# Patient Record
Sex: Male | Born: 1945
Health system: Southern US, Community
[De-identification: ages and names within clinical notes are randomized; demographics above are authoritative.]

## PROBLEM LIST (undated history)

## (undated) DIAGNOSIS — F438 Other reactions to severe stress: Secondary | ICD-10-CM

## (undated) DIAGNOSIS — I10 Essential (primary) hypertension: Secondary | ICD-10-CM

## (undated) DIAGNOSIS — M25519 Pain in unspecified shoulder: Secondary | ICD-10-CM

## (undated) DIAGNOSIS — D721 Eosinophilia, unspecified: Secondary | ICD-10-CM

## (undated) DIAGNOSIS — E785 Hyperlipidemia, unspecified: Secondary | ICD-10-CM

## (undated) DIAGNOSIS — L409 Psoriasis, unspecified: Secondary | ICD-10-CM

## (undated) DIAGNOSIS — M009 Pyogenic arthritis, unspecified: Secondary | ICD-10-CM

## (undated) DIAGNOSIS — IMO0002 Reserved for concepts with insufficient information to code with codable children: Secondary | ICD-10-CM

## (undated) DIAGNOSIS — M545 Low back pain, unspecified: Secondary | ICD-10-CM

## (undated) DIAGNOSIS — L299 Pruritus, unspecified: Secondary | ICD-10-CM

## (undated) DIAGNOSIS — F4389 Other reactions to severe stress: Secondary | ICD-10-CM

## (undated) DIAGNOSIS — L259 Unspecified contact dermatitis, unspecified cause: Secondary | ICD-10-CM

## (undated) DIAGNOSIS — E119 Type 2 diabetes mellitus without complications: Secondary | ICD-10-CM

## (undated) DIAGNOSIS — C649 Malignant neoplasm of unspecified kidney, except renal pelvis: Secondary | ICD-10-CM

## (undated) HISTORY — DX: Other reactions to severe stress: F43.89

## (undated) HISTORY — DX: Unspecified contact dermatitis, unspecified cause: L25.9

## (undated) HISTORY — DX: Low back pain, unspecified: M54.50

## (undated) HISTORY — DX: Reserved for concepts with insufficient information to code with codable children: IMO0002

## (undated) HISTORY — DX: Type 2 diabetes mellitus without complications: E11.9

## (undated) HISTORY — DX: Pruritus, unspecified: L29.9

## (undated) HISTORY — DX: Pyogenic arthritis, unspecified: M00.9

## (undated) HISTORY — DX: Hyperlipidemia, unspecified: E78.5

## (undated) HISTORY — DX: Essential (primary) hypertension: I10

## (undated) HISTORY — DX: Low back pain: M54.5

## (undated) HISTORY — DX: Eosinophilia, unspecified: D72.10

## (undated) HISTORY — DX: Pain in unspecified shoulder: M25.519

## (undated) HISTORY — DX: Other reactions to severe stress: F43.8

## (undated) HISTORY — DX: Malignant neoplasm of unspecified kidney, except renal pelvis: C64.9

## (undated) HISTORY — DX: Eosinophilia: D72.1

---

## 1999-11-25 ENCOUNTER — Encounter: Admission: RE | Admit: 1999-11-25 | Discharge: 2000-02-23 | Payer: Self-pay | Admitting: Endocrinology

## 2000-11-28 ENCOUNTER — Encounter: Payer: Self-pay | Admitting: Endocrinology

## 2000-11-28 ENCOUNTER — Ambulatory Visit (HOSPITAL_COMMUNITY): Admission: RE | Admit: 2000-11-28 | Discharge: 2000-11-28 | Payer: Self-pay | Admitting: Endocrinology

## 2002-09-05 ENCOUNTER — Encounter: Payer: Self-pay | Admitting: Endocrinology

## 2002-09-05 ENCOUNTER — Encounter: Payer: Self-pay | Admitting: Internal Medicine

## 2002-09-05 ENCOUNTER — Encounter: Admission: RE | Admit: 2002-09-05 | Discharge: 2002-09-05 | Payer: Self-pay | Admitting: Endocrinology

## 2004-09-06 ENCOUNTER — Emergency Department (HOSPITAL_COMMUNITY): Admission: EM | Admit: 2004-09-06 | Discharge: 2004-09-06 | Payer: Self-pay | Admitting: Emergency Medicine

## 2004-09-23 ENCOUNTER — Encounter: Admission: RE | Admit: 2004-09-23 | Discharge: 2004-09-23 | Payer: Self-pay | Admitting: Urology

## 2004-09-25 ENCOUNTER — Encounter (INDEPENDENT_AMBULATORY_CARE_PROVIDER_SITE_OTHER): Payer: Self-pay | Admitting: Specialist

## 2004-09-25 ENCOUNTER — Ambulatory Visit (HOSPITAL_COMMUNITY): Admission: RE | Admit: 2004-09-25 | Discharge: 2004-09-25 | Payer: Self-pay | Admitting: Urology

## 2004-09-25 ENCOUNTER — Ambulatory Visit (HOSPITAL_BASED_OUTPATIENT_CLINIC_OR_DEPARTMENT_OTHER): Admission: RE | Admit: 2004-09-25 | Discharge: 2004-09-25 | Payer: Self-pay | Admitting: Urology

## 2004-10-09 ENCOUNTER — Ambulatory Visit (HOSPITAL_BASED_OUTPATIENT_CLINIC_OR_DEPARTMENT_OTHER): Admission: RE | Admit: 2004-10-09 | Discharge: 2004-10-09 | Payer: Self-pay | Admitting: Urology

## 2004-10-09 ENCOUNTER — Ambulatory Visit (HOSPITAL_COMMUNITY): Admission: RE | Admit: 2004-10-09 | Discharge: 2004-10-09 | Payer: Self-pay | Admitting: Urology

## 2005-01-25 ENCOUNTER — Inpatient Hospital Stay (HOSPITAL_COMMUNITY): Admission: RE | Admit: 2005-01-25 | Discharge: 2005-01-28 | Payer: Self-pay | Admitting: Urology

## 2005-01-25 ENCOUNTER — Encounter (INDEPENDENT_AMBULATORY_CARE_PROVIDER_SITE_OTHER): Payer: Self-pay | Admitting: *Deleted

## 2005-01-25 DIAGNOSIS — C649 Malignant neoplasm of unspecified kidney, except renal pelvis: Secondary | ICD-10-CM

## 2005-01-25 DIAGNOSIS — Z85528 Personal history of other malignant neoplasm of kidney: Secondary | ICD-10-CM | POA: Insufficient documentation

## 2005-01-25 HISTORY — DX: Malignant neoplasm of unspecified kidney, except renal pelvis: C64.9

## 2005-01-25 HISTORY — PX: LAPAROSCOPIC PARTIAL NEPHRECTOMY: SUR782

## 2005-04-29 ENCOUNTER — Encounter: Admission: RE | Admit: 2005-04-29 | Discharge: 2005-04-29 | Payer: Self-pay | Admitting: Emergency Medicine

## 2005-04-30 ENCOUNTER — Encounter: Admission: RE | Admit: 2005-04-30 | Discharge: 2005-04-30 | Payer: Self-pay | Admitting: Emergency Medicine

## 2005-04-30 HISTORY — PX: BACK SURGERY: SHX140

## 2005-05-04 ENCOUNTER — Encounter: Admission: RE | Admit: 2005-05-04 | Discharge: 2005-05-04 | Payer: Self-pay | Admitting: Emergency Medicine

## 2005-05-14 ENCOUNTER — Encounter: Admission: RE | Admit: 2005-05-14 | Discharge: 2005-05-14 | Payer: Self-pay | Admitting: Emergency Medicine

## 2005-05-28 ENCOUNTER — Encounter: Admission: RE | Admit: 2005-05-28 | Discharge: 2005-05-28 | Payer: Self-pay | Admitting: Emergency Medicine

## 2005-06-22 ENCOUNTER — Ambulatory Visit (HOSPITAL_COMMUNITY): Admission: RE | Admit: 2005-06-22 | Discharge: 2005-06-23 | Payer: Self-pay | Admitting: Neurosurgery

## 2007-07-10 ENCOUNTER — Encounter: Payer: Self-pay | Admitting: Internal Medicine

## 2009-01-15 ENCOUNTER — Encounter: Payer: Self-pay | Admitting: Internal Medicine

## 2009-01-15 DIAGNOSIS — E1169 Type 2 diabetes mellitus with other specified complication: Secondary | ICD-10-CM | POA: Insufficient documentation

## 2009-01-15 DIAGNOSIS — E1159 Type 2 diabetes mellitus with other circulatory complications: Secondary | ICD-10-CM | POA: Insufficient documentation

## 2009-01-15 DIAGNOSIS — M545 Low back pain: Secondary | ICD-10-CM | POA: Insufficient documentation

## 2009-01-15 DIAGNOSIS — I10 Essential (primary) hypertension: Secondary | ICD-10-CM | POA: Insufficient documentation

## 2009-01-15 DIAGNOSIS — E785 Hyperlipidemia, unspecified: Secondary | ICD-10-CM | POA: Insufficient documentation

## 2009-01-15 DIAGNOSIS — IMO0002 Reserved for concepts with insufficient information to code with codable children: Secondary | ICD-10-CM

## 2009-01-16 ENCOUNTER — Ambulatory Visit: Payer: Self-pay | Admitting: Internal Medicine

## 2009-01-16 ENCOUNTER — Encounter (INDEPENDENT_AMBULATORY_CARE_PROVIDER_SITE_OTHER): Payer: Self-pay | Admitting: *Deleted

## 2009-01-16 DIAGNOSIS — L259 Unspecified contact dermatitis, unspecified cause: Secondary | ICD-10-CM

## 2009-01-16 DIAGNOSIS — N289 Disorder of kidney and ureter, unspecified: Secondary | ICD-10-CM | POA: Insufficient documentation

## 2009-01-16 DIAGNOSIS — E119 Type 2 diabetes mellitus without complications: Secondary | ICD-10-CM | POA: Insufficient documentation

## 2009-01-16 DIAGNOSIS — E1122 Type 2 diabetes mellitus with diabetic chronic kidney disease: Secondary | ICD-10-CM | POA: Insufficient documentation

## 2009-01-16 LAB — CONVERTED CEMR LAB
Basophils Absolute: 0.1 10*3/uL (ref 0.0–0.1)
CO2: 30 meq/L (ref 19–32)
Calcium: 9.2 mg/dL (ref 8.4–10.5)
Cholesterol: 208 mg/dL — ABNORMAL HIGH (ref 0–200)
Creatinine, Ser: 1.1 mg/dL (ref 0.4–1.5)
Eosinophils Absolute: 1 10*3/uL — ABNORMAL HIGH (ref 0.0–0.7)
Glucose, Bld: 72 mg/dL (ref 70–99)
HDL: 54.7 mg/dL (ref >39.00)
Lymphocytes Relative: 19.7 % (ref 12.0–46.0)
MCHC: 34.5 g/dL (ref 30.0–36.0)
Neutrophils Relative %: 53.9 % (ref 43.0–77.0)
RBC: 3.91 M/uL — ABNORMAL LOW (ref 4.22–5.81)
RDW: 14.7 % — ABNORMAL HIGH (ref 11.5–14.6)
Triglycerides: 115 mg/dL (ref 0.0–149.0)

## 2009-02-10 ENCOUNTER — Telehealth: Payer: Self-pay | Admitting: Internal Medicine

## 2009-03-10 ENCOUNTER — Ambulatory Visit: Payer: Self-pay | Admitting: Gastroenterology

## 2009-03-24 ENCOUNTER — Ambulatory Visit: Payer: Self-pay | Admitting: Gastroenterology

## 2009-03-24 ENCOUNTER — Encounter: Payer: Self-pay | Admitting: Gastroenterology

## 2009-03-24 LAB — HM COLONOSCOPY

## 2009-03-26 ENCOUNTER — Encounter: Payer: Self-pay | Admitting: Gastroenterology

## 2009-04-30 HISTORY — PX: REPAIR ANKLE LIGAMENT: SUR1187

## 2009-05-29 ENCOUNTER — Ambulatory Visit: Payer: Self-pay | Admitting: Internal Medicine

## 2009-05-29 DIAGNOSIS — R634 Abnormal weight loss: Secondary | ICD-10-CM | POA: Insufficient documentation

## 2009-05-29 DIAGNOSIS — D721 Eosinophilia: Secondary | ICD-10-CM

## 2009-05-29 LAB — CONVERTED CEMR LAB
ALT: 20 units/L (ref 0–53)
Alkaline Phosphatase: 85 units/L (ref 39–117)
Basophils Absolute: 0 10*3/uL (ref 0.0–0.1)
Bilirubin, Direct: 0 mg/dL (ref 0.0–0.3)
Chloride: 106 meq/L (ref 96–112)
Creatinine, Ser: 1.3 mg/dL (ref 0.4–1.5)
Eosinophils Absolute: 5 10*3/uL — ABNORMAL HIGH (ref 0.0–0.7)
GFR calc non Af Amer: 71.72 mL/min (ref >60)
Hemoglobin: 12.7 g/dL — ABNORMAL LOW (ref 13.0–17.0)
Hgb A1c MFr Bld: 11.6 % — ABNORMAL HIGH (ref 4.6–6.5)
Lymphocytes Relative: 11.7 % — ABNORMAL LOW (ref 12.0–46.0)
MCHC: 33.3 g/dL (ref 30.0–36.0)
Monocytes Absolute: 0.7 10*3/uL (ref 0.1–1.0)
Neutro Abs: 3.5 10*3/uL (ref 1.4–7.7)
Neutrophils Relative %: 34.1 % — ABNORMAL LOW (ref 43.0–77.0)
RDW: 14.7 % — ABNORMAL HIGH (ref 11.5–14.6)
Total Protein: 7.4 g/dL (ref 6.0–8.3)

## 2009-05-31 DIAGNOSIS — L299 Pruritus, unspecified: Secondary | ICD-10-CM

## 2009-05-31 HISTORY — DX: Pruritus, unspecified: L29.9

## 2009-06-01 ENCOUNTER — Emergency Department (HOSPITAL_COMMUNITY): Admission: EM | Admit: 2009-06-01 | Discharge: 2009-06-01 | Payer: Self-pay | Admitting: Emergency Medicine

## 2009-06-02 ENCOUNTER — Ambulatory Visit: Payer: Self-pay | Admitting: Hematology and Oncology

## 2009-06-05 ENCOUNTER — Ambulatory Visit: Payer: Self-pay | Admitting: Cardiovascular Disease

## 2009-06-10 LAB — CBC WITH DIFFERENTIAL/PLATELET
Basophils Absolute: 0 10*3/uL (ref 0.0–0.1)
Eosinophils Absolute: 3.6 10*3/uL — ABNORMAL HIGH (ref 0.0–0.5)
HCT: 38.9 % (ref 38.4–49.9)
HGB: 12.7 g/dL — ABNORMAL LOW (ref 13.0–17.1)
MCV: 92.7 fL (ref 79.3–98.0)
MONO%: 6.7 % (ref 0.0–14.0)
NEUT#: 3 10*3/uL (ref 1.5–6.5)
Platelets: 362 10*3/uL (ref 140–400)
RDW: 15 % — ABNORMAL HIGH (ref 11.0–14.6)

## 2009-06-10 LAB — URINALYSIS, MICROSCOPIC - CHCC
Bilirubin (Urine): NEGATIVE
Leukocyte Esterase: NEGATIVE
Protein: <30 mg/dL
RBC count: NEGATIVE (ref 0–2)
pH: 5 (ref 4.6–8.0)

## 2009-06-10 LAB — MORPHOLOGY

## 2009-06-10 LAB — COMPREHENSIVE METABOLIC PANEL
ALT: 16 U/L (ref 0–53)
AST: 15 U/L (ref 0–37)
Albumin: 4.1 g/dL (ref 3.5–5.2)
BUN: 25 mg/dL — ABNORMAL HIGH (ref 6–23)
Calcium: 9.5 mg/dL (ref 8.4–10.5)
Chloride: 104 mEq/L (ref 96–112)
Potassium: 5 mEq/L (ref 3.5–5.3)

## 2009-06-13 ENCOUNTER — Encounter: Payer: Self-pay | Admitting: Internal Medicine

## 2009-06-13 ENCOUNTER — Other Ambulatory Visit: Admission: RE | Admit: 2009-06-13 | Discharge: 2009-06-13 | Payer: Self-pay | Admitting: Hematology and Oncology

## 2009-07-01 DIAGNOSIS — M009 Pyogenic arthritis, unspecified: Secondary | ICD-10-CM

## 2009-07-01 HISTORY — DX: Pyogenic arthritis, unspecified: M00.9

## 2009-07-13 ENCOUNTER — Inpatient Hospital Stay (HOSPITAL_COMMUNITY): Admission: EM | Admit: 2009-07-13 | Discharge: 2009-07-17 | Payer: Self-pay | Admitting: Emergency Medicine

## 2009-07-14 ENCOUNTER — Ambulatory Visit: Payer: Self-pay | Admitting: Internal Medicine

## 2009-07-17 ENCOUNTER — Ambulatory Visit: Payer: Self-pay | Admitting: Infectious Disease

## 2009-08-04 ENCOUNTER — Ambulatory Visit: Payer: Self-pay | Admitting: Infectious Disease

## 2009-08-04 DIAGNOSIS — F438 Other reactions to severe stress: Secondary | ICD-10-CM

## 2009-08-04 DIAGNOSIS — A4101 Sepsis due to Methicillin susceptible Staphylococcus aureus: Secondary | ICD-10-CM

## 2009-08-04 DIAGNOSIS — F419 Anxiety disorder, unspecified: Secondary | ICD-10-CM | POA: Insufficient documentation

## 2009-08-04 DIAGNOSIS — M009 Pyogenic arthritis, unspecified: Secondary | ICD-10-CM | POA: Insufficient documentation

## 2009-08-04 LAB — CONVERTED CEMR LAB
Basophils Absolute: 0.1 10*3/uL (ref 0.0–0.1)
CRP: 0.8 mg/dL — ABNORMAL HIGH (ref <0.6)
Calcium: 8.5 mg/dL (ref 8.4–10.5)
Eosinophils Relative: 35 % — ABNORMAL HIGH (ref 0–5)
HCT: 30.4 % — ABNORMAL LOW (ref 39.0–52.0)
Lymphocytes Relative: 17 % (ref 12–46)
Lymphs Abs: 1.6 10*3/uL (ref 0.7–4.0)
Neutro Abs: 3.5 10*3/uL (ref 1.7–7.7)
Neutrophils Relative %: 37 % — ABNORMAL LOW (ref 43–77)
Platelets: 311 10*3/uL (ref 150–400)
Potassium: 4 meq/L (ref 3.5–5.3)
Sed Rate: 56 mm/hr — ABNORMAL HIGH (ref 0–16)
Sodium: 141 meq/L (ref 135–145)
WBC: 9.6 10*3/uL (ref 4.0–10.5)

## 2009-08-05 ENCOUNTER — Encounter: Payer: Self-pay | Admitting: Infectious Disease

## 2009-08-06 ENCOUNTER — Ambulatory Visit (HOSPITAL_COMMUNITY): Admission: RE | Admit: 2009-08-06 | Discharge: 2009-08-06 | Payer: Self-pay | Admitting: Infectious Disease

## 2009-08-07 ENCOUNTER — Encounter: Admission: RE | Admit: 2009-08-07 | Discharge: 2009-08-07 | Payer: Self-pay | Admitting: Orthopaedic Surgery

## 2009-08-14 ENCOUNTER — Encounter (HOSPITAL_BASED_OUTPATIENT_CLINIC_OR_DEPARTMENT_OTHER): Admission: RE | Admit: 2009-08-14 | Discharge: 2009-11-12 | Payer: Self-pay | Admitting: Internal Medicine

## 2009-08-19 ENCOUNTER — Ambulatory Visit: Payer: Self-pay | Admitting: Vascular Surgery

## 2009-08-25 ENCOUNTER — Ambulatory Visit: Payer: Self-pay | Admitting: Infectious Disease

## 2009-08-25 ENCOUNTER — Telehealth: Payer: Self-pay | Admitting: Infectious Disease

## 2009-08-25 DIAGNOSIS — L299 Pruritus, unspecified: Secondary | ICD-10-CM | POA: Insufficient documentation

## 2009-08-25 LAB — CONVERTED CEMR LAB
ALT: <8 units/L (ref 0–53)
Albumin: 3.7 g/dL (ref 3.5–5.2)
Alkaline Phosphatase: 90 units/L (ref 39–117)
Basophils Absolute: 0 10*3/uL (ref 0.0–0.1)
CO2: 22 meq/L (ref 19–32)
Eosinophils Relative: 41 % — ABNORMAL HIGH (ref 0–5)
Glucose, Bld: 134 mg/dL — ABNORMAL HIGH (ref 70–99)
HCT: 32.7 % — ABNORMAL LOW (ref 39.0–52.0)
Hemoglobin: 10 g/dL — ABNORMAL LOW (ref 13.0–17.0)
Lymphocytes Relative: 19 % (ref 12–46)
Lymphs Abs: 2.2 10*3/uL (ref 0.7–4.0)
Monocytes Absolute: 0.9 10*3/uL (ref 0.1–1.0)
Neutro Abs: 3.7 10*3/uL (ref 1.7–7.7)
Potassium: 4.1 meq/L (ref 3.5–5.3)
RBC: 3.65 M/uL — ABNORMAL LOW (ref 4.22–5.81)
Sed Rate: 27 mm/hr — ABNORMAL HIGH (ref 0–16)
Sodium: 143 meq/L (ref 135–145)
Total Bilirubin: 0.2 mg/dL — ABNORMAL LOW (ref 0.3–1.2)
Total Protein: 6.8 g/dL (ref 6.0–8.3)
WBC: 11.7 10*3/uL — ABNORMAL HIGH (ref 4.0–10.5)

## 2009-08-28 ENCOUNTER — Ambulatory Visit: Payer: Self-pay | Admitting: Internal Medicine

## 2009-09-05 ENCOUNTER — Ambulatory Visit (HOSPITAL_COMMUNITY): Admission: RE | Admit: 2009-09-05 | Discharge: 2009-09-05 | Payer: Self-pay | Admitting: Internal Medicine

## 2009-09-25 ENCOUNTER — Ambulatory Visit: Payer: Self-pay | Admitting: Hematology and Oncology

## 2009-09-25 ENCOUNTER — Encounter: Payer: Self-pay | Admitting: Internal Medicine

## 2009-09-25 ENCOUNTER — Ambulatory Visit: Payer: Self-pay | Admitting: Infectious Disease

## 2009-09-25 LAB — CONVERTED CEMR LAB
Basophils Absolute: 0 10*3/uL (ref 0.0–0.1)
CO2: 30 meq/L (ref 19–32)
Calcium: 9.3 mg/dL (ref 8.4–10.5)
Creatinine, Ser: 1.06 mg/dL (ref 0.40–1.50)
Glucose, Bld: 189 mg/dL — ABNORMAL HIGH (ref 70–99)
Lymphocytes Relative: 27 % (ref 12–46)
Lymphs Abs: 1.7 10*3/uL (ref 0.7–4.0)
Neutro Abs: 2.4 10*3/uL (ref 1.7–7.7)
Neutrophils Relative %: 37 % — ABNORMAL LOW (ref 43–77)
Platelets: 273 10*3/uL (ref 150–400)
RDW: 16.6 % — ABNORMAL HIGH (ref 11.5–15.5)
Sed Rate: 22 mm/hr — ABNORMAL HIGH (ref 0–16)
Sodium: 141 meq/L (ref 135–145)
WBC: 6.4 10*3/uL (ref 4.0–10.5)

## 2009-09-26 ENCOUNTER — Ambulatory Visit (HOSPITAL_COMMUNITY): Admission: RE | Admit: 2009-09-26 | Discharge: 2009-09-26 | Payer: Self-pay | Admitting: Hematology and Oncology

## 2009-09-26 LAB — COMPREHENSIVE METABOLIC PANEL
Albumin: 3.9 g/dL (ref 3.5–5.2)
Alkaline Phosphatase: 74 U/L (ref 39–117)
BUN: 14 mg/dL (ref 6–23)
Glucose, Bld: 101 mg/dL — ABNORMAL HIGH (ref 70–99)
Potassium: 3.9 mEq/L (ref 3.5–5.3)
Total Bilirubin: 0.7 mg/dL (ref 0.3–1.2)

## 2009-09-26 LAB — LACTATE DEHYDROGENASE: LDH: 350 U/L — ABNORMAL HIGH (ref 94–250)

## 2009-10-01 LAB — CBC WITH DIFFERENTIAL/PLATELET
Basophils Absolute: 0 10*3/uL (ref 0.0–0.1)
EOS%: 31.9 % — ABNORMAL HIGH (ref 0.0–7.0)
HCT: 36.7 % — ABNORMAL LOW (ref 38.4–49.9)
HGB: 11.8 g/dL — ABNORMAL LOW (ref 13.0–17.1)
MCH: 28.5 pg (ref 27.2–33.4)
MCV: 88.6 fL (ref 79.3–98.0)
MONO%: 6 % (ref 0.0–14.0)
NEUT%: 45.1 % (ref 39.0–75.0)

## 2009-12-02 ENCOUNTER — Ambulatory Visit: Payer: Self-pay | Admitting: Internal Medicine

## 2009-12-26 ENCOUNTER — Ambulatory Visit: Payer: Self-pay | Admitting: Hematology and Oncology

## 2010-01-01 LAB — CBC WITH DIFFERENTIAL/PLATELET
Eosinophils Absolute: 0.8 10*3/uL — ABNORMAL HIGH (ref 0.0–0.5)
HCT: 36.6 % — ABNORMAL LOW (ref 38.4–49.9)
HGB: 12.6 g/dL — ABNORMAL LOW (ref 13.0–17.1)
LYMPH%: 19.5 % (ref 14.0–49.0)
MONO#: 0.3 10*3/uL (ref 0.1–0.9)
NEUT#: 2.6 10*3/uL (ref 1.5–6.5)
NEUT%: 56.3 % (ref 39.0–75.0)
Platelets: 209 10*3/uL (ref 140–400)
WBC: 4.6 10*3/uL (ref 4.0–10.3)

## 2010-01-01 LAB — LACTATE DEHYDROGENASE: LDH: 271 U/L — ABNORMAL HIGH (ref 94–250)

## 2010-06-10 ENCOUNTER — Ambulatory Visit
Admission: RE | Admit: 2010-06-10 | Discharge: 2010-06-10 | Payer: Self-pay | Source: Home / Self Care | Attending: Internal Medicine | Admitting: Internal Medicine

## 2010-06-10 DIAGNOSIS — M25519 Pain in unspecified shoulder: Secondary | ICD-10-CM | POA: Insufficient documentation

## 2010-06-13 ENCOUNTER — Emergency Department (HOSPITAL_COMMUNITY)
Admission: EM | Admit: 2010-06-13 | Discharge: 2010-06-14 | Payer: Self-pay | Source: Home / Self Care | Admitting: Emergency Medicine

## 2010-06-20 ENCOUNTER — Emergency Department (HOSPITAL_COMMUNITY)
Admission: EM | Admit: 2010-06-20 | Discharge: 2010-06-20 | Payer: Self-pay | Source: Home / Self Care | Admitting: Emergency Medicine

## 2010-06-21 ENCOUNTER — Encounter: Payer: Self-pay | Admitting: Urology

## 2010-06-21 ENCOUNTER — Encounter: Payer: Self-pay | Admitting: Emergency Medicine

## 2010-06-23 LAB — CBC
MCV: 88.4 fL (ref 78.0–100.0)
Platelets: 220 10*3/uL (ref 150–400)
RDW: 14 % (ref 11.5–15.5)
WBC: 9 10*3/uL (ref 4.0–10.5)

## 2010-06-23 LAB — BASIC METABOLIC PANEL
BUN: 22 mg/dL (ref 6–23)
Chloride: 99 mEq/L (ref 96–112)
Creatinine, Ser: 1.29 mg/dL (ref 0.4–1.5)

## 2010-06-23 LAB — GLUCOSE, CAPILLARY

## 2010-06-30 NOTE — Assessment & Plan Note (Signed)
Summary: 1 MONTH F/U/CH   Visit Type:  Follow-up Referring Provider:  Newt Lukes MD Primary Provider:  Newt Lukes MD  CC:  1 month f/u.  History of Present Illness: 65 yo male with DM who developed left ankle abscess and deep joint infection s/p I and D by Dr. Magnus Ivan on 07/15/09 and 07/18/09 with MSSA isolated from cultures. He was dc on the 17th of February with plans to compete 6 weeks of IV cefazolin. His PICC line came out and I changed him over to oral keflex. He was seen in the wound care clinic where a fair amount of gelatninous material was debrided from his wound. He saw Dr. Lajoyce Corners who took him off of the keflex--though later when I talked with him he told me he was unaware that I was the one rx the antibiotic. IN any case the patient has bee stable off of antibiotic. He continues to have a deep wound but it is shrinking and granulating.He has been without fevers chills and has had still some drainge from wound but it is minimal he says. He has seen his dermatologist who has rx him rx for eczema.    Current Medications (verified): 1)  Glucophage 1000 Mg Tabs (Metformin Hcl) .... Take 1 Two Times A Day 2)  Aspirin 325 Mg Tabs (Aspirin) .... Take 1 By Mouth Qd 3)  Triamcinolone Acetonide 0.1 % Crea (Triamcinolone Acetonide) .... Apply To Affected Area 2-3 Times Daily Prn 4)  Hydroxyzine Hcl 10 Mg Tabs (Hydroxyzine Hcl) .... Take 2 Q 6 Hours For Itching 5)  Simvastatin 80 Mg Tabs (Simvastatin) .... Take 1/2 Tab By Mouth At Bedtime 6)  Lantus Solostar 100 Unit/ml Soln (Insulin Glargine) .... 40 Units Subcutaneously At Bedtime 7)  Claritin 10 Mg Caps (Loratadine) .... Take 1 By Mouth Qd 8)  Keflex 500 Mg Caps (Cephalexin) .... Start After Finihsing The Iv Antibiotics Take Two Pills Four Times A Day Until Dr. Daiva Eves Says To Stop 9)  Losartan Potassium-Hctz 100-25 Mg Tabs (Losartan Potassium-Hctz) .Marland Kitchen.. 1 By Mouth Once Daily  Allergies (verified): 1)  ! Norvasc (Amlodipine  Besylate)   Preventive Screening-Counseling & Management  Alcohol-Tobacco     Alcohol drinks/day: <1     Smoking Status: quit     Tobacco Counseling: not to resume use of tobacco products  Caffeine-Diet-Exercise     Caffeine use/day: coffee2 per day   Current Allergies (reviewed today): ! NORVASC (AMLODIPINE BESYLATE) Past History:  Past Medical History: Last updated: Sep 20, 2009 Diabetes mellitus, insulin dep Hyperlipidemia Hypertension (L) Renal cell cancer s/p resection 8/06 dermatitis - ?psoriasis vs excema  MD rooster: uro - [facility] derm- [facility] -[facility] - [facility] - [facility] -[facility] -[facility]  Past Surgical History: Last updated: 09/20/09 L partial Nephrectomy (01/25/05) -[facility] Back surgery (2006) -[facility]  L ankle (distal fib) fx/infx - 04/2009  Family History: Last updated: 09-20-09 mom - died age 64 - "paralyzed" unsure if stroke or other dz dad - died age 64 - asthma problems  no colon cancer no known cancers or premature deaths  Social History: Last updated: 09/20/09 Former Smoker -quit 1996 denies alcohol use married, 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  The patient denies anorexia, fever, weight loss, weight gain, vision loss,  decreased hearing, hoarseness, chest pain, syncope, dyspnea on exertion, peripheral edema, prolonged cough, headaches, hemoptysis, abdominal pain, melena, hematochezia, severe indigestion/heartburn, hematuria, incontinence, genital sores, muscle weakness, transient blindness, difficulty walking, depression, unusual weight change, abnormal bleeding, and enlarged lymph nodes.    Vital Signs:  Patient profile:   65 year old male Height:      66 inches (167.64 cm) Weight:      169 pounds  (76.82 kg) BMI:     27.38 Temp:     98.0 degrees F (36.67 degrees C) oral Pulse rate:   65 / minute BP sitting:   157 / 83  (left arm)  Vitals Entered By: Starleen Arms CMA (September 25, 2009 10:50 AM) CC: 1 month f/u Is Patient Diabetic? No Pain Assessment Patient in pain? no      Nutritional Status BMI of 25 - 29 = overweight Nutritional Status Detail nl  Does patient need assistance? Functional Status Self care Ambulation Normal   Physical Exam  General:  alert, well-developed, well-nourished, and well-hydrated.   Head:  normocephalic, atraumatic, and no abnormalities observed.   Eyes:  vision grossly intact, pupils equal, and pupils round.   Ears:  no external deformities.   Nose:  no external deformity and no external erythema.   Mouth:  good dentition, no dental plaque, pharynx pink and moist, and no erythema.   Neck:  supple and full ROM.   Lungs:  normal respiratory effort, no crackles, and no wheezes.   Heart:  normal rate, regular rhythm, no murmur, no gallop, and no rub.   Abdomen:  soft, non-tender, no hepatomegaly, and no splenomegaly.   Msk:  normal ROM.   Extremities:  3+  bilateral edema in his legs and arms Neurologic:  alert & oriented X3, strength normal in all extremities, and gait normal.   Skin:  his ankle with wound with pink granulation tissue with still wound that goes several cm deep. no purulence from wound today Psych:  Oriented X3, memory intact for recent and remote, and normally interactive.     Impression & Recommendations:  Problem # 1:  SEPTIC ARTHRITIS (ICD-711.00) He has finished IV antibiotics adn is now off of keflex. He will continue with wound care from Dr. Chilton Si and to be followed closely by Dr. Lajoyce Corners. I will coulbe check his esr and crp for reassurance. His updated medication list for this problem includes:    Aspirin 325 Mg Tabs (Aspirin) .Marland Kitchen... Take 1 by mouth qd    Keflex 500 Mg Caps (Cephalexin) ..... Start after finihsing  the iv antibiotics take two pills four times a day until dr. Zenaida Niece dam says to stop  Orders: T-Basic Metabolic Panel [id number]) T-CBC w/Diff [id number]) T-C-Reactive Protein [id number]) T-Sed Rate (Automated) [phone]) Est. Patient Level IV (03474)  Problem # 2:  METHICILLIN SUSCEPTIBLE STAPH AUREUS SEPTICEMIA (ICD-038.11)  see above  Orders: Est. Patient Level IV (25956)  Problem # 3:  DERMATITIS (ICD-692.9)  is apparently eczema which can be driven by MSSA or MRSA as well. His updated medication list for this problem includes:    Triamcinolone Acetonide 0.1 % Crea (Triamcinolone acetonide) .Marland Kitchen... Apply to affected area 2-3 times daily prn    Claritin 10 Mg Caps (Loratadine) .Marland Kitchen... Take 1 by mouth qd  Orders: Est. Patient Level IV (38756)  Problem # 4:  HYPERTENSION (ICD-401.9)  Not well controlled. Needs to folllowup with Dr. Thomasene Lot for this. His updated medication list for this problem includes:    Losartan Potassium-hctz 100-25  Mg Tabs (Losartan potassium-hctz) .Marland Kitchen... 1 by mouth once daily  BP today: 157/83 Prior BP: 168/74 (08/28/2009)  Labs Reviewed: K+: 4.1 (08/25/2009) Creat: : 0.98 (08/25/2009)   Chol: 208 (01/16/2009)   HDL: 54.70 (01/16/2009)   TG: 115.0 (01/16/2009)  Orders: Est. Patient Level IV (16109)  Patient Instructions: 1)  rtc to see Dr. Daiva Eves in 2 months 2)  we will check some blood work today 3)  If that is reassuring we will continue with plan of local therapy without antibiotics Four, dry, sterile 4x4s applied to left lateral ankle and secured with transpore tape surrounding the 4x4. Jennet Maduro, RN 11:47 AM

## 2010-06-30 NOTE — Assessment & Plan Note (Signed)
Summary: hsfu severe ankle infection/abs   Referring Provider:  Newt Lukes MD Primary Provider:  Newt Lukes MD  CC:  follow-up from hospital left foot infection.  History of Present Illness: 65 yo male with DM who developed left ankle abscess and deep joint infection s/p I and D by Dr. Magnus Ivan on 07/15/09 and 07/18/09 with MSSA isolated from cultures. He was dc on the 17th of February with plans to compete 6 weeks of IV cefazolin. His PICC line came out slightly when he was using crutches which he is no longer using. He is due to see Dr. Magnus Ivan tomorrow to have stitches removed from his foot. He has been without fevers chills and has had still some drainge from wound but it is minimal he says.  Problems Prior to Update: 1)  Eosinophilia  (ICD-288.3) 2)  Weight Loss, Abnormal  (ICD-783.21) 3)  Dermatitis  (ICD-692.9) 4)  Screening, Colon Cancer  (ICD-V76.51) 5)  Hypertension  (ICD-401.9) 6)  Hyperlipidemia  (ICD-272.4) 7)  Diabetes Mellitus, Type II, On Insulin, Controlled  (ICD-250.00) 8)  Kidney Disease  (ICD-593.9) 9)  Chickenpox, Hx of  (ICD-V15.9) 10)  Degenerative Disc Disease  (ICD-722.6) 11)  Low Back Pain  (ICD-724.2) 12)  Renal Cell Cancer  (ICD-189.0) 13)  Organic Impotence  (ICD-607.84) 14)  Chest Pain, Non-cardiac  (ICD-786.59)  Medications Prior to Update: 1)  Glucophage 1000 Mg Tabs (Metformin Hcl) .... Take 1 Two Times A Day 2)  Aspirin 325 Mg Tabs (Aspirin) .... Take 1 By Mouth Qd 3)  Triamcinolone Acetonide 0.1 % Crea (Triamcinolone Acetonide) .... Apply To Affected Area 2-3 Times Daily 4)  Hydroxyzine Hcl 10 Mg Tabs (Hydroxyzine Hcl) .... Take 2 Q 6 Hours For Itching 5)  Simvastatin 80 Mg Tabs (Simvastatin) .... Take 1 At Bedtime 6)  Amlodipine Besylate 10 Mg Tabs (Amlodipine Besylate) .... Take 1 By Mouth Once Daily 7)  Lantus Solostar 100 Unit/ml Soln (Insulin Glargine) .... 40 Units Subcutaneously At Bedtime 8)  Acritretin 25mg  .... Take 1 By  Mouth Qd 9)  Ranitidine Hcl 150 Mg Caps (Ranitidine Hcl) .... Take 1 Two Times A Day 10)  Claritin 10 Mg Caps (Loratadine) .... Take 1 By Mouth Qd  Current Medications (verified): 1)  Glucophage 1000 Mg Tabs (Metformin Hcl) .... Take 1 Two Times A Day 2)  Aspirin 325 Mg Tabs (Aspirin) .... Take 1 By Mouth Qd 3)  Triamcinolone Acetonide 0.1 % Crea (Triamcinolone Acetonide) .... Apply To Affected Area 2-3 Times Daily 4)  Hydroxyzine Hcl 10 Mg Tabs (Hydroxyzine Hcl) .... Take 2 Q 6 Hours For Itching 5)  Simvastatin 80 Mg Tabs (Simvastatin) .... Take 1 At Bedtime 6)  Amlodipine Besylate 10 Mg Tabs (Amlodipine Besylate) .... Take 1 By Mouth Once Daily 7)  Lantus Solostar 100 Unit/ml Soln (Insulin Glargine) .... 40 Units Subcutaneously At Bedtime 8)  Acritretin 25mg  .... Take 1 By Mouth Qd 9)  Ranitidine Hcl 150 Mg Caps (Ranitidine Hcl) .... Take 1 Two Times A Day 10)  Claritin 10 Mg Caps (Loratadine) .... Take 1 By Mouth Qd 11)  Cefazolin Sodium 1 Gm Solr (Cefazolin Sodium) .... 2 Grams Iv Three Times A Day  Allergies (verified): No Known Drug Allergies   Preventive Screening-Counseling & Management  Alcohol-Tobacco     Alcohol drinks/day: <1     Smoking Status: quit     Tobacco Counseling: not to resume use of tobacco products  Caffeine-Diet-Exercise     Caffeine use/day: coffee2 per day  Current Allergies (reviewed today): No known allergies  Past History:  Past Medical History: Last updated: Mar 13, 2009 Diabetes mellitus, insulin dep Hyperlipidemia Hypertension (L) Renal cell cancer s/p resection 8/06 - [facility] Low back pain   Past Surgical History: Last updated: 03/13/2009 L partial Nephrectomy (01/25/05) -[facility] Back surgery (2006) -[facility]   Family History: Last updated: 03-13-09 mom - died age 46 - "paralyzed" unsure if stroke or other dz dad - died age 28 - asthma problems  no colon cancer  Social History: Last updated: 2009/03/13 Former Smoker -quit  1996 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  The patient denies anorexia, fever, weight loss, weight gain, vision loss, decreased hearing, hoarseness, chest pain, syncope, dyspnea on exertion, peripheral edema, prolonged cough, headaches, hemoptysis, abdominal pain, melena, hematochezia, severe indigestion/heartburn, hematuria, incontinence, genital sores, muscle weakness, transient blindness, difficulty walking, depression, unusual weight change, abnormal bleeding, and enlarged lymph nodes.    Vital Signs:  Patient profile:   65 year old male Height:      66 inches (167.64 cm) Weight:      178.6 pounds (81.18 kg) BMI:     28.93 Temp:     98.3 degrees F (36.83 degrees C) oral Pulse rate:   75 / minute BP sitting:   171 / 75  (left arm)  Vitals Entered By: Wendall Mola CMA Duncan Dull) (August 04, 2009 3:20 PM) CC: follow-up from hospital left foot infection Is Patient Diabetic? Yes Did you bring your meter with you today? No Pain Assessment Patient in pain? no      Nutritional Status BMI of 25 - 29 = overweight Nutritional Status Detail appetite "good"  Does patient need assistance? Functional Status Cook/clean, Shopping, Social activities Ambulation Normal   Physical Exam  General:  alert, well-developed, well-nourished, and well-hydrated.   Head:  normocephalic, atraumatic, and no abnormalities observed.   Eyes:  vision grossly intact, pupils equal, and pupils round.   Ears:  no external deformities.   Nose:  no external deformity and no external erythema.   Mouth:  good dentition, no dental plaque, pharynx pink and moist, and no erythema.   Neck:  supple and full ROM.   Lungs:  normal respiratory effort, no crackles, and no wheezes.   Heart:  normal  rate, regular rhythm, no murmur, no gallop, and no rub.   Abdomen:  soft, non-tender, no hepatomegaly, and no splenomegaly.   Msk:  normal ROM.   Extremities:  2+ bilateral edema worwe on the left Neurologic:  alert & oriented X3, strength normal in all extremities, and gait normal.   Skin:  he has lateral incision with exudate on dressing on the wound sticthes intact. PICC site with some blood dried along the entrance of the PICC into skin Psych:  Oriented X3, memory intact for recent and remote, and normally interactive.     Impression & Recommendations:  Problem # 1:  SEPTIC ARTHRITIS (ICD-711.00) He needs at least 6 weeks of systemic cefazolin. I would like to see him back at the end of 08/25/09 His updated medication list for this problem includes:    Aspirin 325 Mg Tabs (Aspirin) .Marland Kitchen... Take 1 by mouth qd    Cefazolin Sodium 1 Gm Solr (Cefazolin sodium) .Marland Kitchen... 2 grams iv three times a day  Orders: T-Basic Metabolic Panel [id number]) T-CBC w/Diff [id number]) T-C-Reactive Protein [id number]) T-Sed Rate (Automated) [id number]) Est. Patient Level IV (62831)  Problem # 2:  METHICILLIN SUSCEPTIBLE STAPH AUREUS SEPTICEMIA (ICD-038.11) Assessment: Comment Only  see above  Orders: Est. Patient Level IV (51761)  Problem # 3:  HYPERTENSION (ICD-401.9)  BP is poorly controlled but he is anxious in clinic today and he is symptomatic however he needs to see PCP to get this under better control His updated medication list for this problem includes:    Amlodipine Besylate 10 Mg Tabs (Amlodipine besylate) .Marland Kitchen... Take 1 by mouth once daily  Orders: Est. Patient Level IV (60737)  Problem # 4:  COMPLICATIONS MEDICAL CARE NEC INFECTION DUE CVC (ICD-999.31)  our IV team came and evaluted the PICC and are concern for risk of inection in its current state. WIll place new PICC.  Orders: Est. Patient Level IV (10626)  Problem # 5:  ANXIETY, SITUATIONAL (ICD-308.3)  He may need an  anxiolytyic for PICC placment per my RN  Orders: Est. Patient Level IV (94854)  Medications Added to Medication List This Visit: 1)  Cefazolin Sodium 1 Gm Solr (Cefazolin sodium) .... 2 grams iv three times a day  Patient Instructions: 1)  rtc on March the 28th 2)  we will check bmp, cbc today this should be checked weekly by [facility] Process Orders Check Orders Results:     [facility]: Check successful Tests Sent for requisitioning (August 04, 2009 7:20 PM):     08/04/2009: [facility] -- T-Basic Metabolic Panel [id number] (signed)     08/04/2009: [facility] -- T-CBC w/Diff [[id number]] (signed)     08/04/2009: [facility] -- T-C-Reactive Protein [id number] (signed)     08/04/2009: [facility] -- T-Sed Rate (Automated) [[id number]] (signed)

## 2010-06-30 NOTE — Miscellaneous (Signed)
Summary: Med request and PICC order   Clinical Lists Changes  Medications: Added new medication of ATIVAN 1 MG TABS (LORAZEPAM) take one tablet 30 mins. prior to procdeure.  If not relaxed take the second tablet after 30 mins. - Signed Rx of ATIVAN 1 MG TABS (LORAZEPAM) take one tablet 30 mins. prior to procdeure.  If not relaxed take the second tablet after 30 mins.;  #2 x 0;  Signed;  Entered by: Janyth Contes;  Authorized by: Paulette Blanch Dam MD;  Method used: Telephoned to [facility]  [address] # [id number]*, [address] [place], [place], [place]  [address], Ph: [phone] or [phone], Fax: [phone] Orders: Added new Test order of Radiology other (Radiology Other) - Signed    Prescriptions: ATIVAN 1 MG TABS (LORAZEPAM) take one tablet 30 mins. prior to procdeure.  If not relaxed take the second tablet after 30 mins.  #2 x 0   Entered by:   Tomasita Morrow RN   Authorized by:   Acey Lav MD   Signed by:   Tomasita Morrow RN on 08/05/2009   Method used:   Telephoned to ...       [facility]  [address] # [id number]* (retail)       [address]       [place], [place]  [address]       Ph: [phone] or [phone]       Fax: [phone]   RxID:   [id number]   Phone Note Call from Patient   Summary of Call: Pt requesting by mouth medication prior to PICC placement for his nerves". Please advise  [facility],  [address] Initial call taken by: Tomasita Morrow RN,  August 05, 2009 3:15 PM    New/Updated Medications: ATIVAN 1 MG TABS (LORAZEPAM) take one tablet 30 mins. prior to procdeure.  If not relaxed take the second tablet after 30 mins.

## 2010-06-30 NOTE — Assessment & Plan Note (Signed)
Summary: f/u numbess left forearm/#/cd   Vital Signs:  Patient profile:   65 year old male Height:      66 inches (167.64 cm) Weight:      171.6 pounds (78.00 kg) O2 Sat:      98 % on Room air Temp:     97.4 degrees F (36.33 degrees C) oral Pulse rate:   63 / minute BP sitting:   140 / 68  (left arm) Cuff size:   regular  Vitals Entered By: Orlan Leavens (December 02, 2009 10:34 AM)  O2 Flow:  Room air CC: follow-up visit Is Patient Diabetic? Yes Did you bring your meter with you today? No Pain Assessment Patient in pain? no      Comments Pt states at one time (L) arm was numb. He states he think he hade over reacted when he mage appt he think his arm was asleep since he had just woke up. Arm has'nt been numb since   Primary Care Provider:  Newt Lukes MD  CC:  follow-up visit.  History of Present Illness: c/o transient numbness in left arm - "asleep feeling" when awoke this AM-  reports left arm was curled under his body and neck akwardly during sleep - symptoms resolved approx 5 min - complete return to normal sensation - no weakness - no vision changes or transient blindness no leg, face or other sensation alteration - no trouble writing or talking or expressing thoughts  hx left ankle abscess and deep joint infection s/p I& D  07/2009 with MSSA cx- s/p PICC IV abx and oral abx course - all symptoms resolved - ankle "normal again" - no swelling, no pain, no fever or redness   skin condition - [facility] dr Darcel Bayley  taking hydroxizine and actitretin -but no better still itchy dry skin, esp worse after hot shower/bath now dx with ezcema - improved with different lotions  DM2 - has been changed to insulin by [facility] -  request pen form as hard to see syringe and dose approp also still taking metformin - no hypoglycemia symptoms or sugars >200s  HTN -  reports compliance with ongoing medical treatment; [facility] reviewed. denies adverse side effects related to current therapy.   weight loss, unintentional - new problem ongoing - ?onset since retiring (last 12mos) since OV here 01/06/09, lost 30# without change in diet or inc in physical activity colo 02/2009 - no malig; CT C/A/P 06/05/2009 neg; BM bx 06/13/2009 nonsp/neg  hx RCC - "cancer free" per his uro - no longer following with uro  dyslipidemia - on statin for years - no GI or muscle symptoms    Clinical Review Panels:  Lipid Management   Cholesterol:  208 (01/16/2009)   HDL (good cholesterol):  54.70 (01/16/2009)  Diabetes Management   HgBA1C:  11.6 (05/29/2009)   Creatinine:  1.06 (09/25/2009)  CBC   WBC:  6.4 (09/25/2009)   RBC:  4.16 (09/25/2009)   Hgb:  11.7 (09/25/2009)   Hct:  37.8 (09/25/2009)   Platelets:  273 (09/25/2009)   MCV  90.9 (09/25/2009)   MCHC  31.0 (09/25/2009)   RDW  16.6 (09/25/2009)   PMN:  37 (09/25/2009)   Lymphs:  27 (09/25/2009)   Monos:  8 (09/25/2009)   Eosinophils:  27 (09/25/2009)   Basophil:  1 (09/25/2009)  Complete Metabolic Panel   Glucose:  189 (09/25/2009)   Sodium:  141 (09/25/2009)   Potassium:  4.9 (09/25/2009)   Chloride:  104 (09/25/2009)   CO2:  30 (09/25/2009)   BUN:  19 (09/25/2009)   Creatinine:  1.06 (09/25/2009)   Albumin:  3.7 (08/25/2009)   Total Protein:  6.8 (08/25/2009)   Calcium:  9.3 (09/25/2009)   Total Bili:  0.2 (08/25/2009)   Alk Phos:  90 (08/25/2009)   SGPT (ALT):  <8 U/L (08/25/2009)   SGOT (AST):  19 (08/25/2009)   Current Medications (verified): 1)  Glucophage 1000 Mg Tabs (Metformin Hcl) .... Take 1 Two Times A Day 2)  Simvastatin 80 Mg Tabs (Simvastatin) .... Take 1/2 Tab By Mouth At Bedtime 3)  Lantus Solostar 100 Unit/ml Soln (Insulin Glargine) .... 40 Units Subcutaneously At Bedtime 4)  Claritin 10 Mg Caps (Loratadine) .... Take 1 By Mouth Qd 5)  Lisinopril-Hydrochlorothiazide 20-12.5 Mg Tabs (Lisinopril-Hydrochlorothiazide)  .... Take 1 By Mouth Once Daily 6)  Ranitidine Hcl 150 Mg Caps (Ranitidine Hcl) .... Take 1 Two Times A Day 7)  Amlodipine Besylate 10 Mg Tabs (Amlodipine Besylate) .... Take 1 By Mouth Once Daily 8)  Dermacerin  Crea (Skin Protectants, Misc.) .... Apply Up To Three Times A Day  Allergies (verified): 1)  ! Norvasc (Amlodipine Besylate)  Past History:  Past Medical History: Diabetes mellitus, insulin dep Hyperlipidemia Hypertension (L) Renal cell cancer s/p resection 8/06 dermatitis -  excema  MD roster: Darlen Round - [facility]y derm- smith ID -[facility]o - [facility] - [facility] heme -odogwu Nsurg -[facility]  Review of Systems  The patient denies anorexia, fever, weight loss, chest pain, syncope, and headaches.    Physical Exam  General:  alert, well-developed, well-nourished, and cooperative to examination.    Lungs:  normal respiratory effort, no intercostal retractions or use of accessory muscles; normal breath sounds bilaterally - no crackles and no wheezes.    Heart:  normal rate, regular rhythm, no murmur, and no rub. BLE without edema.  Neurologic:  alert & oriented X3 and cranial nerves II-XII symetrically intact.  strength normal in all extremities, sensation intact to light touch, and gait normal. speech fluent without dysarthria or aphasia; follows commands with good comprehension.  Skin:  diffuse thickened darkened macular nodules, confluent, with irregularity to palpation present B UE distally on flexor and extensor surfaces - also B thighs, face, chest, neck - + excoriation, no ulceration - no erythema or vesicles   Impression & Recommendations:  Problem # 1:  NUMBNESS, ARM (ICD-782.0) likely positional as related to sleep position -  symptoms with awake from sleep and arm pinned below body/neck -  numbness quickly resolved with upright position restored -  no prior symptoms or residual deficits - no motor weakness - reassurance provided - but to call if recurrence or  daytime symptoms not related to position/sleep  Problem # 2:  EOSINOPHILIA (ICD-288.3)  ?related to infx--- concerning with weight loss - but this has stablized in past months 05/2009 eval included CT c/a/p and heme eval with BM bx - no dx for malignacy -  to keep f/u with heme later this month as planned  Problem # 3:  DIABETES MELLITUS, TYPE II, ON INSULIN, CONTROLLED (ICD-250.00)  The following medications were removed from the medication list:    Aspirin 325 Mg Tabs (Aspirin) .Marland Kitchen... Take 1 by mouth qd    Losartan Potassium-hctz 100-25 Mg Tabs (Losartan potassium-hctz) .Marland Kitchen... 1 by mouth once daily His updated medication list for this problem includes:    Glucophage 1000 Mg Tabs (  Metformin hcl) .Marland Kitchen... Take 1 two times a day    Lantus Solostar 100 Unit/ml Soln (Insulin glargine) .Marland KitchenMarland KitchenMarland KitchenMarland Kitchen 40 units subcutaneously at bedtime    Lisinopril-hydrochlorothiazide 20-12.5 Mg Tabs (Lisinopril-hydrochlorothiazide) .Marland Kitchen... Take 1 by mouth once daily  uncontrolled - reports labs done at [facility] - cont same (as per [place])  Labs Reviewed: Creat: 1.06 (09/25/2009)    Reviewed HgBA1c results: 11.6 (05/29/2009)  7.1 (01/16/2009)  Problem # 4:  HYPERTENSION (ICD-401.9) per [facility] The following medications were removed from the medication list:    Losartan Potassium-hctz 100-25 Mg Tabs (Losartan potassium-hctz) .Marland Kitchen... 1 by mouth once daily His updated medication list for this problem includes:    Lisinopril-hydrochlorothiazide 20-12.5 Mg Tabs (Lisinopril-hydrochlorothiazide) .Marland Kitchen... Take 1 by mouth once daily    Amlodipine Besylate 10 Mg Tabs (Amlodipine besylate) .Marland Kitchen... Take 1 by mouth once daily  BP today: 140/68 Prior BP: 157/83 (09/25/2009)  Labs Reviewed: K+: 4.9 (09/25/2009) Creat: : 1.06 (09/25/2009)   Chol: 208 (01/16/2009)   HDL: 54.70 (01/16/2009)   TG: 115.0 (01/16/2009)  Problem # 5:  HYPERLIPIDEMIA (ICD-272.4)  mgmt per [facility] His updated medication list for this problem includes:    Simvastatin 80 Mg  Tabs (Simvastatin) .Marland Kitchen... Take 1/2 tab by mouth at bedtime  Labs Reviewed: SGOT: 19 (08/25/2009)   SGPT: <8 U/L (08/25/2009)   HDL:54.70 (01/16/2009)  Chol:208 (01/16/2009)  Trig:115.0 (01/16/2009)  Problem # 6:  PRURITUS (ICD-698.9)  ?eczema - no evidence for psoriasis or other derm disorder or malignancy cont current lotion/steroid tx as symptoms improved  Complete Medication List: 1)  Glucophage 1000 Mg Tabs (Metformin hcl) .... Take 1 two times a day 2)  Simvastatin 80 Mg Tabs (Simvastatin) .... Take 1/2 tab by mouth at bedtime 3)  Lantus Solostar 100 Unit/ml Soln (Insulin glargine) .... 40 units subcutaneously at bedtime 4)  Claritin 10 Mg Caps (Loratadine) .... Take 1 by mouth qd 5)  Lisinopril-hydrochlorothiazide 20-12.5 Mg Tabs (Lisinopril-hydrochlorothiazide) .... Take 1 by mouth once daily 6)  Ranitidine Hcl 150 Mg Caps (Ranitidine hcl) .... Take 1 two times a day 7)  Amlodipine Besylate 10 Mg Tabs (Amlodipine besylate) .... Take 1 by mouth once daily 8)  Dermacerin Crea (Skin protectants, misc.) .... Apply up to three times a day  Patient Instructions: 1)  it was good to see you today. 2)  call us or 911 anytime you have symptoms of weakness/numbness that do not resolve as it may signal a stroke - 3)  keep followup at [facility] and with hematology (cancer center) as discussed -  4)  no change in medications - continue all as reviewed today - 5)  Please schedule a follow-up appointment in 4-6 months, sooner if problems.

## 2010-06-30 NOTE — Assessment & Plan Note (Signed)
Summary: 3 mos f/u #/cd   Vital Signs:  Patient profile:   65 year old male Height:      66 inches (167.64 cm) Weight:      181.12 pounds (82.33 kg) O2 Sat:      97 % on Room air Temp:     98.1 degrees F (36.72 degrees C) oral Pulse rate:   66 / minute BP sitting:   168 / 74  (left arm) Cuff size:   regular  Vitals Entered By: Orlan Leavens (August 28, 2009 8:57 AM)  O2 Flow:  Room air CC: 3 month follow-up Is Patient Diabetic? Yes Did you bring your meter with you today? No Pain Assessment Patient in pain? no        Primary Care Provider:  Newt Lukes MD  CC:  3 month follow-up.  History of Present Illness: 65 yo male with DM who developed left ankle abscess and deep joint infection s/p I and D by Dr. Magnus Ivan on 07/15/09 and 07/18/09 with MSSA isolated from cultures. He was dc on the 17th of February with plans to compete 6 weeks of IV cefazolin. His PICC line came out slightly when he was using crutches which he is no longer using. PICC removed 48h ago, pt has not yet started oral abx.. He has been followed in wound care clinic where hyperbaric O2 has been considered. He continues to have open wound on her left ankle that today is without purluence but certainly quite deep.  He has been without fevers chills and has had still some drainge from wound but it is minimal he says.   skin condition - [facility]eng dr Darcel Bayley  taking hydroxizine and actitretin -but no better still itchy dry skin, esp worse after hot shower/bath  DM2 - has been changed to insulin by [facility] -  request pen form as hard to see syringe and dose approp also still taking metformin - no hypoglycemia symptoms or sugars >200s  HTN -  reports compliance with ongoing medical treatment; [facility] by [facility] reviewed. denies adverse side effects related to current therapy.   weight loss, unintentional - new problem ongoing - ?onset since retiring (last  12mos) since OV here 01/06/09, lost 30# without change in diet or inc in physical activity colo 02/2009 - no malig; CT C/A/P 06/05/2009 neg; BM bx 06/13/2009 nonsp/neg  hx RCC - "cancer free" per his uro - no longer following with uro  dyslipidemia - on statin for years - no GI or muscle symptoms    Clinical Review Panels:  Prevention   Last Colonoscopy:  DONE (03/24/2009)  Immunizations   Last Tetanus Booster:  Historical (06/01/2007)  CBC   WBC:  11.7 (08/25/2009)   RBC:  3.65 (08/25/2009)   Hgb:  10.0 (08/25/2009)   Hct:  32.7 (08/25/2009)   Platelets:  376 (08/25/2009)   MCV  89.6 (08/25/2009)   MCHC  30.6 (08/25/2009)   RDW  16.7 (08/25/2009)   PMN:  32 (08/25/2009)   Lymphs:  19 (08/25/2009)   Monos:  7 (08/25/2009)   Eosinophils:  41 (08/25/2009)   Basophil:  0 (08/25/2009)  Complete Metabolic Panel   Glucose:  134 (08/25/2009)   Sodium:  143 (08/25/2009)   Potassium:  4.1 (08/25/2009)   Chloride:  107 (08/25/2009)   CO2:  22 (08/25/2009)   BUN:  10 (08/25/2009)   Creatinine:  0.98 (08/25/2009)   Albumin:  3.7 (08/25/2009)  Total Protein:  6.8 (08/25/2009)   Calcium:  8.5 (08/25/2009)   Total Bili:  0.2 (08/25/2009)   Alk Phos:  90 (08/25/2009)   SGPT (ALT):  <8 U/L (08/25/2009)   SGOT (AST):  19 (08/25/2009)   Current Medications (verified): 1)  Glucophage 1000 Mg Tabs (Metformin Hcl) .... Take 1 Two Times A Day 2)  Aspirin 325 Mg Tabs (Aspirin) .... Take 1 By Mouth Qd 3)  Triamcinolone Acetonide 0.1 % Crea (Triamcinolone Acetonide) .... Apply To Affected Area 2-3 Times Daily Prn 4)  Hydroxyzine Hcl 10 Mg Tabs (Hydroxyzine Hcl) .... Take 2 Q 6 Hours For Itching 5)  Simvastatin 80 Mg Tabs (Simvastatin) .... Take 1 At Bedtime 6)  Lantus Solostar 100 Unit/ml Soln (Insulin Glargine) .... 40 Units Subcutaneously At Bedtime 7)  Claritin 10 Mg Caps (Loratadine) .... Take 1 By Mouth Qd 8)  Cefazolin Sodium 1 Gm Solr (Cefazolin Sodium) .... 2 Grams Iv Three Times A  Day 9)  Keflex 500 Mg Caps (Cephalexin) .... Start After Finihsing The Iv Antibiotics Take Two Pills Four Times A Day Until Dr. Daiva Eves Says To Stop  Allergies (verified): 1)  ! Norvasc (Amlodipine Besylate)  Past History:  Past Medical History: Diabetes mellitus, insulin dep Hyperlipidemia Hypertension (L) Renal cell cancer s/p resection 8/06 dermatitis - ?psoriasis vs excema  MD rooster: uro - [facility] derm- smith ID -[facility] - [facility] - [facility] -odogwu Nsurg -[facility]  Past Surgical History: L partial Nephrectomy (01/25/05) -grapey Back surgery (2006) -[facility]  L ankle (distal fib) fx/infx - 05-17-09  Family History: mom - died age 16 - "paralyzed" unsure if stroke or other dz dad - died age 62 - asthma problems  no colon cancer no known cancers or premature deaths  Social History: Former Smoker -quit 1996 denies alcohol use married, 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 I have reviewed all other systems and they were negative.   Physical Exam  General:  alert, well-developed, well-nourished, and cooperative to examination.    Lungs:  normal respiratory effort, no intercostal retractions or use of accessory muscles; normal breath sounds bilaterally - no crackles and no wheezes.    Heart:  normal rate, regular rhythm, no murmur, and no rub. BLE without edema. (left ankle wrapped for infx - see below) Abdomen:  soft, non-tender, normal bowel sounds, no distention; no masses and no appreciable hepatomegaly or splenomegaly.   Skin:  diffuse thickened darkened macular nodules, now confluent, with irregularity to palpation present B UE distally on flexor and extensor surfaces - also B thighs, face, chest, neck - + excoriation, no ulceration - no erythema or vesicles Psych:  Oriented X3, memory intact for recent and remote, normally interactive, good eye contact, min anxious appearing, not depressed appearing, and not  agitated.      Impression & Recommendations:  Problem # 1:  PRURITUS (ICD-698.9)  ?eczema or psoriasis or other derm disorder? [facility] and his local dermatologist have different opinions on this - but denies any bx ever done will send for "tie breaker" opinion at his request to help choose effective tx for his dx  Orders: Dermatology Referral (Derma)  Problem # 2:  SEPTIC ARTHRITIS (ICD-711.00)  s/p I&D following with ortho blackman and ID vandam - now on keflex.  labs reviewed His updated medication list for this problem includes:    Aspirin 325 Mg Tabs (Aspirin) .Marland Kitchen... Take 1  by mouth qd    Cefazolin Sodium 1 Gm Solr (Cefazolin sodium) .Marland Kitchen... 2 grams iv three times a day    Keflex 500 Mg Caps (Cephalexin) ..... Start after finihsing the iv antibiotics take two pills four times a day until dr. Zenaida Niece dam says to stop  Problem # 3:  EOSINOPHILIA (ICD-288.3)  ?related to infx--- except presence on CBC seems to predate known infx complications - concerning with weight loss 05/2009 eval included CT c/a/p and heme eval with BM bx - no dx for malignacy -   Orders: Dermatology Referral (Derma)  Problem # 4:  WEIGHT LOSS, ABNORMAL (ICD-783.21)  >25lbs unintentional weight loss in 6 mos! see eval as above -   Problem # 5:  DIABETES MELLITUS, TYPE II, ON INSULIN, CONTROLLED (ICD-250.00)  uncontrolled - exac by infx - \reportslabs just done at [place] - will send for records -  cont same (as per [place]) His updated medication list for this problem includes:    Glucophage 1000 Mg Tabs (Metformin hcl) .Marland Kitchen... Take 1 two times a day    Aspirin 325 Mg Tabs (Aspirin) .Marland Kitchen... Take 1 by mouth qd    Lantus Solostar 100 Unit/ml Soln (Insulin glargine) .Marland KitchenMarland KitchenMarland KitchenMarland Kitchen 40 units subcutaneously at bedtime    Losartan Potassium-hctz 100-25 Mg Tabs (Losartan potassium-hctz) .Marland Kitchen... 1 by mouth once daily  Labs Reviewed: Creat: 0.98 (08/25/2009)    Reviewed HgBA1c results: 11.6 (05/29/2009)  7.1  (01/16/2009)  Problem # 6:  HYPERLIPIDEMIA (ICD-272.4)  reduce from high dose simvastatin to avoid complications -  await copy of labs from [place] -- or will redraw here next OV His updated medication list for this problem includes:    Simvastatin 80 Mg Tabs (Simvastatin) .Marland Kitchen... Take 1/2 tab by mouth at bedtime  Labs Reviewed: SGOT: 19 (08/25/2009)   SGPT: <8 U/L (08/25/2009)   HDL:54.70 (01/16/2009)  Chol:208 (01/16/2009)  Trig:115.0 (01/16/2009)  Problem # 7:  HYPERTENSION (ICD-401.9)  off amlodipine due to edema - will add Losartan because of co-exisitng DM -  new e-rx done BP today: 168/74 Prior BP: 152/83 (08/25/2009)  Labs Reviewed: K+: 4.1 (08/25/2009) Creat: : 0.98 (08/25/2009)   Chol: 208 (01/16/2009)   HDL: 54.70 (01/16/2009)   TG: 115.0 (01/16/2009)  His updated medication list for this problem includes:    Losartan Potassium-hctz 100-25 Mg Tabs (Losartan potassium-hctz) .Marland Kitchen... 1 by mouth once daily  Orders: Prescription Created Electronically [phone])  Problem # 8:  other Time spent with patient 45 minutes, more than 50% of this time was spent reviewing hosp, and interval workup/consults, med review and changes discussed, and neww for new opinion from derm re; skin condition and tx; also to obtain records from [place]  Complete Medication List: 1)  Glucophage 1000 Mg Tabs (Metformin hcl) .... Take 1 two times a day 2)  Aspirin 325 Mg Tabs (Aspirin) .... Take 1 by mouth qd 3)  Triamcinolone Acetonide 0.1 % Crea (Triamcinolone acetonide) .... Apply to affected area 2-3 times daily prn 4)  Hydroxyzine Hcl 10 Mg Tabs (Hydroxyzine hcl) .... Take 2 q 6 hours for itching 5)  Simvastatin 80 Mg Tabs (Simvastatin) .... Take 1/2 tab by mouth at bedtime 6)  Lantus Solostar 100 Unit/ml Soln (Insulin glargine) .... 40 units subcutaneously at bedtime 7)  Claritin 10 Mg Caps (Loratadine) .... Take 1 by mouth qd 8)  Keflex 500 Mg Caps (Cephalexin) .... Start after finihsing the iv  antibiotics take two pills four times a day until dr. Zenaida Niece dam says to stop  9)  Losartan Potassium-hctz 100-25 Mg Tabs (Losartan potassium-hctz) .Marland Kitchen.. 1 by mouth once daily  Patient Instructions: 1)  it was good to see you today.  2)  we'll make referral to another dermatology (skin) specialist. Our office will contact you regarding this appointment once made.  3)  reduce the dose of simvastatin by 1/2 - take 40mg  at bedtime  4)  will plan to recheck labs next visit -  5)  start new medication for your blood pressure - losartan hctz - your prescription has been electronically submitted to your pharmacy. Please take as directed. Contact our office if you believe you're having problems with the medication(s).  6)  take the antibiotics as prescribed - 7)  Please schedule a follow-up appointment in 3 months, sooner if problems.  Prescriptions: LOSARTAN POTASSIUM-HCTZ 100-25 MG TABS (LOSARTAN POTASSIUM-HCTZ) 1 by mouth once daily  #30 x 5   Entered and Authorized by:   Newt Lukes MD   Signed by:   Newt Lukes MD on 08/28/2009   Method used:   Electronically to        [facility]  [address] # [id number]* (retail)       [address]       [place], [place]  [address]       Ph: [phone] or [phone]       Fax: [phone]   RxID:   [id number]

## 2010-06-30 NOTE — Progress Notes (Signed)
Summary: Care Plan Oversight  Phone Note Outgoing Call   Call placed by: Acey Lav MD,  August 25, 2009 1:36 PM Details for Reason: Care Plan Oversight Summary of Call: [phone] (30 or more mins)  I have supervised home care and/or infusion therapy for this pt, including providing orders for care, review of labs and/or home health care plans, communicating with the home health care professionals and/or patient/caregivers to integrate current information into the medical treatment plan and/or adjust the medical therapy. This supervision has been provided for 32 _minutes during the calendar month. Dates for this oversight February 17th through March 18th.   Initial call taken by: Acey Lav MD,  August 25, 2009 1:37 PM

## 2010-06-30 NOTE — Assessment & Plan Note (Signed)
Summary: F/U APPT/VS   Visit Type:  Follow-up Referring Provider:  Newt Lukes MD Primary Provider:  Newt Lukes MD  CC:  hsfu and .  History of Present Illness: 65 yo male with DM who developed left ankle abscess and deep joint infection s/p I and D by Dr. Magnus Ivan on 07/15/09 and 07/18/09 with MSSA isolated from cultures. He was dc on the 17th of February with plans to compete 6 weeks of IV cefazolin. His PICC line came out slightly when he was using crutches which he is no longer using. It needed to be replaced and he has continued on therapy. Today it again is rather haphazardly taped into place and appears slightly bloody. He has been followed in wound care clinic where hyperbaric O2 has been considered. He continues to have open wound on her left ankle that today is without purluence but certainly quite deep.  He has been without fevers chills and has had still some drainge from wound but it is minimal he says. He has one new complaint of diffuse swelling of his arms and legs that began afer he started amlodipine that was rx by his [facility] at [facility]  Current Medications (verified): 1)  Glucophage 1000 Mg Tabs (Metformin Hcl) .... Take 1 Two Times A Day 2)  Aspirin 325 Mg Tabs (Aspirin) .... Take 1 By Mouth Qd 3)  Triamcinolone Acetonide 0.1 % Crea (Triamcinolone Acetonide) .... Apply To Affected Area 2-3 Times Daily 4)  Hydroxyzine Hcl 10 Mg Tabs (Hydroxyzine Hcl) .... Take 2 Q 6 Hours For Itching 5)  Simvastatin 80 Mg Tabs (Simvastatin) .... Take 1 At Bedtime 6)  Lantus Solostar 100 Unit/ml Soln (Insulin Glargine) .... 40 Units Subcutaneously At Bedtime 7)  Acritretin 25mg  .... Take 1 By Mouth Qd 8)  Ranitidine Hcl 150 Mg Caps (Ranitidine Hcl) .... Take 1 Two Times A Day 9)  Claritin 10 Mg Caps (Loratadine) .... Take 1 By Mouth Qd 10)  Cefazolin Sodium 1 Gm Solr (Cefazolin Sodium) .... 2 Grams Iv Three Times A Day 11)  Ativan 1 Mg Tabs (Lorazepam)  .... Take One Tablet 30 Mins. Prior To Procdeure.  If Not Relaxed Take The Second Tablet After 30 Mins. 12)  Keflex 500 Mg Caps (Cephalexin) .... Start After Finihsing The Iv Antibiotics Take Two Pills Four Times A Day Until Dr. Daiva Eves Says To Stop  Allergies (verified): 1)  ! Norvasc (Amlodipine Besylate)   Preventive Screening-Counseling & Management  Alcohol-Tobacco     Alcohol drinks/day: <1     Smoking Status: quit     Tobacco Counseling: not to resume use of tobacco products  Caffeine-Diet-Exercise     Caffeine use/day: coffee2 per day   Current Allergies (reviewed today): ! NORVASC (AMLODIPINE BESYLATE) Past History:  Past Medical History: Diabetes mellitus, insulin dep Hyperlipidemia Hypertension (L) Renal cell cancer s/p resection 8/06 - grapey Low back pain  Apparent norvasc induced diffuse edema  Past Surgical History: Reviewed history from 03/10/2009 and no changes required. L partial Nephrectomy (01/25/05) -[facility] Back surgery (2006) -[facility]   Family History: Reviewed history from 03/10/2009 and no changes required. mom - died age 64 - "paralyzed" unsure if stroke or other dz dad - died age 20 - asthma problems  no colon cancer  Social History: Reviewed history from 03/10/2009 and no changes required. 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  The patient denies anorexia, fever, weight loss, weight gain, vision loss, decreased hearing, hoarseness, chest pain, syncope, dyspnea on exertion, peripheral edema, prolonged cough, headaches, hemoptysis, abdominal pain, melena, hematochezia, severe indigestion/heartburn, hematuria, incontinence, genital sores, muscle weakness, transient blindness, difficulty walking, depression, unusual weight change, abnormal bleeding, and enlarged lymph nodes.    Vital Signs:  Patient profile:   65 year old male Height:      66  inches (167.64 cm) Weight:      185.31 pounds (84.23 kg) BMI:     30.02 Temp:     98.3 degrees F (36.83 degrees C) oral Pulse rate:   103 / minute BP sitting:   152 / 83  (left arm)  Vitals Entered By: Starleen Arms CMA (August 25, 2009 2:01 PM) CC: hsfu,  Is Patient Diabetic? Yes Pain Assessment Patient in pain? no      Nutritional Status BMI of > 30 = obese Nutritional Status Detail nl  Does patient need assistance? Functional Status Self care Ambulation Normal   Physical Exam  General:  alert, well-developed, well-nourished, and well-hydrated.   Head:  normocephalic, atraumatic, and no abnormalities observed.   Eyes:  vision grossly intact, pupils equal, and pupils round.   Ears:  no external deformities.   Nose:  no external deformity and no external erythema.   Mouth:  good dentition, no dental plaque, pharynx pink and moist, and no erythema.   Neck:  supple and full ROM.   Lungs:  normal respiratory effort, no crackles, and no wheezes.   Heart:  normal rate, regular rhythm, no murmur, no gallop, and no rub.   Abdomen:  soft, non-tender, no hepatomegaly, and no splenomegaly.   Msk:  normal ROM.   Extremities:  3+  bilateral edema in his legs and arms Neurologic:  alert & oriented X3, strength normal in all extremities, and gait normal.   Skin:  dry, his PICC site was again with what apppeared to be dried blood and was taped with clear tape in haphazard manner Psych:  Oriented X3, memory intact for recent and remote, and normally interactive.     Impression & Recommendations:  Problem # 1:  SEPTIC ARTHRITIS (ICD-711.00) I will recheck his esr, crp and if these are reassuring will change him to high dose keflex. In any case I am anxious to get the PICC out of him as he does not seem to be taking care of it very well. I expect he is scratching the insertion site. He will see Dr. Magnus Ivan tomorrow. His updated medication list for this problem includes:    Aspirin  325 Mg Tabs (Aspirin) .Marland Kitchen... Take 1 by mouth qd    Cefazolin Sodium 1 Gm Solr (Cefazolin sodium) .Marland Kitchen... 2 grams iv three times a day    Keflex 500 Mg Caps (Cephalexin) ..... Start after finihsing the iv antibiotics take two pills four times a day until dr. Zenaida Niece dam says to stop  Orders: T-Comprehensive Metabolic Panel [id number]) T-CBC w/Diff [id number]) T-C-Reactive Protein [id number]) T-Sed Rate (Automated) [id number]) Est. Patient Level IV (64332)  Problem # 2:  METHICILLIN SUSCEPTIBLE STAPH AUREUS SEPTICEMIA (ICD-038.11)  see above  Orders: Est. Patient Level IV (95188)  Problem # 3:  ANASARCA (ICD-782.3)  I think this is due to his norvasc and have asked him to stop this medication and for him to alert his MDs at the [place]. He also has Dr. Felicity Coyer listed as a PCP  Orders: Est. Patient Level  IV (09811)  Problem # 4:  COMPLICATIONS MEDICAL CARE NEC INFECTION DUE CVC (ICD-999.31)  His PICC line appears to have be poorly cared for again. I am anxoius to get it removed and am inclined to do so even if his inflammatory markers are still up.  Orders: Est. Patient Level IV (91478)  Problem # 5:  HYPERTENSION (ICD-401.9)  He will need a different BP med with the norvasc appearing to have induced diffuse edema The following medications were removed from the medication list:    Amlodipine Besylate 10 Mg Tabs (Amlodipine besylate) .Marland Kitchen... Take 1 by mouth once daily  Orders: Est. Patient Level IV (29562)  Problem # 6:  PRURITUS (ICD-698.9)  He states taht he has been believed to have eczema by dermatologist vs different dermatological condition and that his [facility] dermat[facility] and his local dermatologist have different opinions on this. I have asked him to try to faciitate communication betweent the tow of them. IN ancy case I think his pruritis is jeopardizing his PICC line.  Orders: Est. Patient Level IV (13086)  Medications Added to Medication List This Visit: 1)  Keflex 500  Mg Caps (Cephalexin) .... Start after finihsing the iv antibiotics take two pills four times a day until dr. Zenaida Niece dam says to stop  Patient Instructions: 1)  We will check labs today 2)  If they are reassuring we will pull PICC line and change to oral antibiotics: 3)  keflex 500mg  two tabs four timess a day 4)  STOP YOUR AMLODIPINE AND NOTIFY YOUR [facility] AT THE [facility] AS THIS IS CAUSING YOUR SWELLING IN YOUR ARMS AND LEGS 5)  Please schedule a follow-up appointment in 1 month. Prescriptions: KEFLEX 500 MG CAPS (CEPHALEXIN) start after finihsing the IV antibiotics take two pills four times a day until Dr. Daiva Eves says to stop  #240 x 5   Entered and Authorized by:   Acey Lav MD   Signed by:   Paulette Blanch Dam MD on 08/25/2009   Method used:   Print then Give to Patient   RxID:   [id number] KEFLEX 500 MG CAPS (CEPHALEXIN) start after finihsing the IV antibiotics take two pills four times a day until Dr. Daiva Eves says to stop  #240 x 5   Entered and Authorized by:   Acey Lav MD   Signed by:   Paulette Blanch Dam MD on 08/25/2009   Method used:   Electronically to        [facility] [address] [id number]* (retail)       [address]       [place], [place]  [address]       Ph: [phone] or [phone]       Fax: [phone]   RxID:   [id number]  Process Orders Check Orders Results:     [facility]: Check successful Tests Sent for requisitioning (August 25, 2009 8:47 PM):     08/25/2009: [facility] -- T-Comprehensive Metabolic Panel [[id number]] (signed)     08/25/2009: [facility] -- T-CBC w/Diff [[id number]] (signed)     08/25/2009: [facility] -- T-C-Reactive Protein [id number] (signed)     08/25/2009: [facility] -- T-Sed Rate (Automated) [phone] (signed)  Dsg. changed to right upper arm PICC per protocol.  Also above labs were drawn from single lumen PICC  without difficulty and flushed with 10cc NS and 2.5cc Heparin per protocol.  Glenda  Palmer RN  August 25, 2009 3:14 PM

## 2010-07-02 NOTE — Assessment & Plan Note (Signed)
Summary: R SHOULDER PAIN  STC   Vital Signs:  Patient profile:   65 year old male Height:      66 inches (167.64 cm) Weight:      180.4 pounds (82 kg) O2 Sat:      97 % on Room air Temp:     98.2 degrees F (36.78 degrees C) oral Pulse rate:   75 / minute BP sitting:   142 / 82  (left arm) Cuff size:   regular  Vitals Entered By: Orlan Leavens RMA (June 10, 2010 10:02 AM)  O2 Flow:  Room air CC: Ongoing (R) shoulder pain Is Patient Diabetic? Yes Did you bring your meter with you today? Yes Pain Assessment Patient in pain? yes     Location: (R) shoulder Type: aching Comments Pt is requesting copy of visit/ dx to take to his representative   Primary Care Provider:  Newt Lukes MD  CC:  Ongoing (R) shoulder pain.  History of Present Illness: R shoulder pain, acute on chronic pain worst when lying on right side no recent injury, remote dislocation feels like "toothache" at rest, not worse with movement, no weakness needs [facility]" about the problem (per pt)  hx left ankle abscess and deep joint infection s/p I& D  07/2009 with MSSA cx- s/p PICC IV abx and oral abx course - all symptoms resolved - ankle "normal again" - no swelling, no pain, no fever or redness   skin condition - ? [facility] dr Darcel Bayley  taking hydroxizine and actitretin - some improved still occ itchy dry skin, esp worse after hot shower/bath now dx with ezcema - improved with different lotions  DM2 - has been [facility] by [facility] -  also still taking metformin - no hypoglycemia symptoms or sugars >200s  HTN -  reports compliance with ongoing medical treatment; [facility] by [facility] reviewed. denies adverse side effects related to current therapy.   weight loss, unintentional - resolved since fall 2011 ?onset since retiring (last 12mos) since 1st OV here 01/06/09, lost 30# without change in diet or inc in  physical activity colo 02/2009 - no malig; CT C/A/P 06/05/2009 neg; BM bx 06/13/2009 nonsp/neg  hx RCC - "cancer free" per his uro - no longer following with uro  dyslipidemia - on statin for years - no GI or muscle symptoms    Clinical Review Panels:  Lipid Management   Cholesterol:  208 (01/16/2009)   HDL (good cholesterol):  54.70 (01/16/2009)  Diabetes Management   HgBA1C:  11.6 (05/29/2009)   Creatinine:  1.06 (09/25/2009)  CBC   WBC:  6.4 (09/25/2009)   RBC:  4.16 (09/25/2009)   Hgb:  11.7 (09/25/2009)   Hct:  37.8 (09/25/2009)   Platelets:  273 (09/25/2009)   MCV  90.9 (09/25/2009)   MCHC  31.0 (09/25/2009)   RDW  16.6 (09/25/2009)   PMN:  37 (09/25/2009)   Lymphs:  27 (09/25/2009)   Monos:  8 (09/25/2009)   Eosinophils:  27 (09/25/2009)   Basophil:  1 (09/25/2009)  Complete Metabolic Panel   Glucose:  189 (09/25/2009)   Sodium:  141 (09/25/2009)   Potassium:  4.9 (09/25/2009)   Chloride:  104 (09/25/2009)   CO2:  30 (09/25/2009)   BUN:  19 (09/25/2009)   Creatinine:  1.06 (09/25/2009)   Albumin:  3.7 (08/25/2009)   Total Protein:  6.8 (08/25/2009)   Calcium:  9.3 (09/25/2009)   Total Bili:  0.2 (08/25/2009)   Alk Phos:  90 (08/25/2009)   SGPT (ALT):  <8 U/L (08/25/2009)   SGOT (AST):  19 (08/25/2009)   Current Medications (verified): 1)  Glucophage 1000 Mg Tabs (Metformin Hcl) .... Take 1 Two Times A Day 2)  Simvastatin 80 Mg Tabs (Simvastatin) .... Take 1/2 Tab By Mouth At Bedtime 3)  Lantus Solostar 100 Unit/ml Soln (Insulin Glargine) .... 40 Units Subcutaneously At Bedtime 4)  Claritin 10 Mg Caps (Loratadine) .... Take 1 By Mouth Qd 5)  Lisinopril-Hydrochlorothiazide 20-12.5 Mg Tabs (Lisinopril-Hydrochlorothiazide) .... Take 1 By Mouth Once Daily 6)  Ranitidine Hcl 150 Mg Caps (Ranitidine Hcl) .... Take 1 Two Times A Day 7)  Amlodipine Besylate 10 Mg Tabs (Amlodipine Besylate) .... Take 1 By Mouth Once Daily 8)  Dermacerin  Crea (Skin Protectants,  Misc.) .... Apply Up To Three Times A Day  Allergies (verified): 1)  ! Norvasc (Amlodipine Besylate)  Past History:  Past Medical History: Diabetes mellitus, insulin dep Hyperlipidemia Hypertension (L) Renal cell cancer s/p resection 8/06 dermatitis -  excema vs psoriasis?  MD roster:  Darlen Round - [facility] derm- [facility] ID -[facility]o - [facility] - [facility] -[facility] N[facility] -[facility]  Review of Systems  The patient denies chest pain, syncope, dyspnea on exertion, peripheral edema, and headaches.    Physical Exam  General:  alert, well-developed, well-nourished, and cooperative to examination.    Lungs:  normal respiratory effort, no intercostal retractions or use of accessory muscles; normal breath sounds bilaterally - no crackles and no wheezes.    Heart:  normal rate, regular rhythm, no murmur, and no rub. BLE without edema.  Msk:  right decreased range of motion on forward flexion, abduction, and internal rotation. Positive impingement signs. Decreased strength with stressing of rotator cuff. Pain with crossed arm adduction. referred pain into distal deltoid. Tender over a.c. joint and subacromial.    Impression & Recommendations:  Problem # 1:  SHOULDER PAIN, RIGHT, CHRONIC (ICD-719.41)  appears to have chronic RTC tear and limited ROM - refer to [facility] to [facility] for [facility] (as per pt request for apaerwork) xray declined by pt as recently done at [place] (and normal per pt reports, no injury or recnet trauma) tx with muscle relaxant for assoc spasm in neck - erx done  Orders: Prescription Created Electronically [phone]) Orthopedic Surgeon Referral (Ortho Surgeon)  His updated medication list for this problem includes:    Flexeril 10 Mg Tabs (Cyclobenzaprine hcl) .Marland Kitchen... 1/2-1 by mouth at bedtime for shoulder and neck spasm pain  Problem # 2:  DIABETES MELLITUS, TYPE II, ON INSULIN, CONTROLLED (ICD-250.00)  His updated medication list for this problem includes:    Glucophage  1000 Mg Tabs (Metformin hcl) .Marland Kitchen... Take 1 two times a day    Lantus Solostar 100 Unit/ml Soln (Insulin glargine) .Marland KitchenMarland KitchenMarland KitchenMarland Kitchen 40 units subcutaneously at bedtime    Lisinopril-hydrochlorothiazide 20-12.5 Mg Tabs (Lisinopril-hydrochlorothiazide) .Marland Kitchen... Take 1 by mouth once daily  uncontrolled on our labs - reports recent labs done at [facility] - cont same (as per [place]) - declines recheck labs  Labs Reviewed: Creat: 1.06 (09/25/2009)    Reviewed HgBA1c results: 11.6 (05/29/2009)  7.1 (01/16/2009)  Complete Medication List: 1)  Glucophage 1000 Mg Tabs (Metformin hcl) .... Take 1 two times a day 2)  Simvastatin 80 Mg Tabs (Simvastatin) .... Take 1/2 tab by mouth at bedtime 3)  Lantus Solostar 100 Unit/ml Soln (Insulin glargine) .... 40 units subcutaneously at  bedtime 4)  Claritin 10 Mg Caps (Loratadine) .... Take 1 by mouth qd 5)  Lisinopril-hydrochlorothiazide 20-12.5 Mg Tabs (Lisinopril-hydrochlorothiazide) .... Take 1 by mouth once daily 6)  Ranitidine Hcl 150 Mg Caps (Ranitidine hcl) .... Take 1 two times a day 7)  Amlodipine Besylate 10 Mg Tabs (Amlodipine besylate) .... Take 1 by mouth once daily 8)  Dermacerin Crea (Skin protectants, misc.) .... Apply up to three times a day 9)  Flexeril 10 Mg Tabs (Cyclobenzaprine hcl) .... 1/2-1 by mouth at bedtime for shoulder and neck spasm pain  Patient Instructions: 1)  it was good to see you today. 2)  flexeril for shoulder and neck pain/spasm - your prescription has been electronically submitted to your pharmacy. Please take as directed. Contact our office if you believe you're having problems with the medication(s).  3)  we'll make referral to dr. Lajoyce Corners (or other ortho specialist). Our office will contact you regarding this appointment once made.  4)  continue to follow at [facility] for your other issues - 5)  handicap tag application signed today 6)  no other change in medications - continue all as reviewed today - 7)  Please schedule a follow-up appointment in 4-6  months, sooner if problems.  Prescriptions: FLEXERIL 10 MG TABS (CYCLOBENZAPRINE HCL) 1/2-1 by mouth at bedtime for shoulder and neck spasm pain  #30 x 1   Entered and Authorized by:   Newt Lukes MD   Signed by:   Newt Lukes MD on 06/10/2010   Method used:   Electronically to        [facility]  [address] [id number]* (retail)       [address]       [place], [place]  [address]       Ph: [phone] or [phone]       Fax: [phone]   RxID:   [id number]    Orders Added: 1)  Est. Patient Level IV [88416] 2)  Prescription Created Electronically [phone] 3)  Orthopedic Surgeon Referral Gaylord Shih Surgeon]

## 2010-08-16 LAB — CBC
HCT: 36.2 % — ABNORMAL LOW (ref 39.0–52.0)
Platelets: 318 10*3/uL (ref 150–400)
WBC: 9.8 10*3/uL (ref 4.0–10.5)

## 2010-08-16 LAB — DIFFERENTIAL
Basophils Absolute: 0 10*3/uL (ref 0.0–0.1)
Lymphocytes Relative: 9 % — ABNORMAL LOW (ref 12–46)
Monocytes Relative: 7 % (ref 3–12)
Neutro Abs: 4.3 10*3/uL (ref 1.7–7.7)
Neutrophils Relative %: 44 % (ref 43–77)

## 2010-08-16 LAB — BONE MARROW EXAM: Bone Marrow Exam: 33

## 2010-08-19 LAB — BASIC METABOLIC PANEL
BUN: 13 mg/dL (ref 6–23)
BUN: 8 mg/dL (ref 6–23)
CO2: 24 mEq/L (ref 19–32)
CO2: 25 mEq/L (ref 19–32)
CO2: 25 mEq/L (ref 19–32)
Calcium: 8.2 mg/dL — ABNORMAL LOW (ref 8.4–10.5)
Calcium: 8.3 mg/dL — ABNORMAL LOW (ref 8.4–10.5)
Calcium: 8.9 mg/dL (ref 8.4–10.5)
Chloride: 105 mEq/L (ref 96–112)
Chloride: 110 mEq/L (ref 96–112)
Chloride: 98 mEq/L (ref 96–112)
Creatinine, Ser: 1.01 mg/dL (ref 0.4–1.5)
Creatinine, Ser: 1.12 mg/dL (ref 0.4–1.5)
Creatinine, Ser: 1.13 mg/dL (ref 0.4–1.5)
GFR calc Af Amer: >60 mL/min (ref >60)
GFR calc Af Amer: >60 mL/min (ref >60)
GFR calc Af Amer: >60 mL/min (ref >60)
GFR calc Af Amer: >60 mL/min (ref >60)
GFR calc non Af Amer: 57 mL/min — ABNORMAL LOW (ref >60)
GFR calc non Af Amer: >60 mL/min (ref >60)
Glucose, Bld: 69 mg/dL — ABNORMAL LOW (ref 70–99)
Potassium: 4.3 mEq/L (ref 3.5–5.1)
Potassium: 4.6 mEq/L (ref 3.5–5.1)
Sodium: 138 mEq/L (ref 135–145)
Sodium: 140 mEq/L (ref 135–145)

## 2010-08-19 LAB — DIFFERENTIAL
Basophils Absolute: 0 10*3/uL (ref 0.0–0.1)
Basophils Relative: 0 % (ref 0–1)
Eosinophils Absolute: 1.8 10*3/uL — ABNORMAL HIGH (ref 0.0–0.7)
Eosinophils Relative: 22 % — ABNORMAL HIGH (ref 0–5)
Lymphocytes Relative: 5 % — ABNORMAL LOW (ref 12–46)
Lymphs Abs: 0.9 10*3/uL (ref 0.7–4.0)
Monocytes Absolute: 1.1 10*3/uL — ABNORMAL HIGH (ref 0.1–1.0)
Monocytes Relative: 14 % — ABNORMAL HIGH (ref 3–12)
Neutrophils Relative %: 76 % (ref 43–77)

## 2010-08-19 LAB — GLUCOSE, CAPILLARY
Glucose-Capillary: 104 mg/dL — ABNORMAL HIGH (ref 70–99)
Glucose-Capillary: 108 mg/dL — ABNORMAL HIGH (ref 70–99)
Glucose-Capillary: 119 mg/dL — ABNORMAL HIGH (ref 70–99)
Glucose-Capillary: 134 mg/dL — ABNORMAL HIGH (ref 70–99)
Glucose-Capillary: 138 mg/dL — ABNORMAL HIGH (ref 70–99)
Glucose-Capillary: 153 mg/dL — ABNORMAL HIGH (ref 70–99)
Glucose-Capillary: 183 mg/dL — ABNORMAL HIGH (ref 70–99)
Glucose-Capillary: 189 mg/dL — ABNORMAL HIGH (ref 70–99)
Glucose-Capillary: 195 mg/dL — ABNORMAL HIGH (ref 70–99)
Glucose-Capillary: 197 mg/dL — ABNORMAL HIGH (ref 70–99)
Glucose-Capillary: 201 mg/dL — ABNORMAL HIGH (ref 70–99)
Glucose-Capillary: 204 mg/dL — ABNORMAL HIGH (ref 70–99)
Glucose-Capillary: 240 mg/dL — ABNORMAL HIGH (ref 70–99)
Glucose-Capillary: 251 mg/dL — ABNORMAL HIGH (ref 70–99)
Glucose-Capillary: 274 mg/dL — ABNORMAL HIGH (ref 70–99)
Glucose-Capillary: 82 mg/dL (ref 70–99)
Glucose-Capillary: 86 mg/dL (ref 70–99)
Glucose-Capillary: 88 mg/dL (ref 70–99)

## 2010-08-19 LAB — CBC
HCT: 25.5 % — ABNORMAL LOW (ref 39.0–52.0)
HCT: 28.9 % — ABNORMAL LOW (ref 39.0–52.0)
HCT: 31 % — ABNORMAL LOW (ref 39.0–52.0)
Hemoglobin: 8.6 g/dL — ABNORMAL LOW (ref 13.0–17.0)
Hemoglobin: 9.9 g/dL — ABNORMAL LOW (ref 13.0–17.0)
MCHC: 33.8 g/dL (ref 30.0–36.0)
MCHC: 33.9 g/dL (ref 30.0–36.0)
MCHC: 34.1 g/dL (ref 30.0–36.0)
MCV: 90.2 fL (ref 78.0–100.0)
MCV: 91 fL (ref 78.0–100.0)
MCV: 91.1 fL (ref 78.0–100.0)
Platelets: 380 10*3/uL (ref 150–400)
Platelets: 521 10*3/uL — ABNORMAL HIGH (ref 150–400)
RBC: 2.8 MIL/uL — ABNORMAL LOW (ref 4.22–5.81)
RBC: 3.2 MIL/uL — ABNORMAL LOW (ref 4.22–5.81)
RBC: 3.41 MIL/uL — ABNORMAL LOW (ref 4.22–5.81)
RDW: 15.3 % (ref 11.5–15.5)
WBC: 10.7 10*3/uL — ABNORMAL HIGH (ref 4.0–10.5)
WBC: 11.4 10*3/uL — ABNORMAL HIGH (ref 4.0–10.5)
WBC: 14.1 10*3/uL — ABNORMAL HIGH (ref 4.0–10.5)
WBC: 16.7 10*3/uL — ABNORMAL HIGH (ref 4.0–10.5)

## 2010-08-19 LAB — ANAEROBIC CULTURE

## 2010-08-19 LAB — WOUND CULTURE

## 2010-08-19 LAB — CULTURE, BLOOD (ROUTINE X 2): Culture: NO GROWTH

## 2010-09-03 LAB — GLUCOSE, CAPILLARY: Glucose-Capillary: 246 mg/dL — ABNORMAL HIGH (ref 70–99)

## 2010-09-10 ENCOUNTER — Encounter: Payer: Self-pay | Admitting: Internal Medicine

## 2010-09-11 ENCOUNTER — Ambulatory Visit (INDEPENDENT_AMBULATORY_CARE_PROVIDER_SITE_OTHER): Payer: Medicare Other | Admitting: Internal Medicine

## 2010-09-11 ENCOUNTER — Encounter: Payer: Self-pay | Admitting: Internal Medicine

## 2010-09-11 ENCOUNTER — Other Ambulatory Visit (INDEPENDENT_AMBULATORY_CARE_PROVIDER_SITE_OTHER): Payer: Medicare Other

## 2010-09-11 DIAGNOSIS — Z79899 Other long term (current) drug therapy: Secondary | ICD-10-CM

## 2010-09-11 DIAGNOSIS — E119 Type 2 diabetes mellitus without complications: Secondary | ICD-10-CM

## 2010-09-11 DIAGNOSIS — E785 Hyperlipidemia, unspecified: Secondary | ICD-10-CM

## 2010-09-11 DIAGNOSIS — I10 Essential (primary) hypertension: Secondary | ICD-10-CM

## 2010-09-11 LAB — LIPID PANEL
Cholesterol: 196 mg/dL (ref 0–200)
LDL Cholesterol: 111 mg/dL — ABNORMAL HIGH (ref 0–99)

## 2010-09-11 LAB — HEPATIC FUNCTION PANEL
Alkaline Phosphatase: 106 U/L (ref 39–117)
Bilirubin, Direct: 0.1 mg/dL (ref 0.0–0.3)
Total Protein: 7.1 g/dL (ref 6.0–8.3)

## 2010-09-11 LAB — HEMOGLOBIN A1C: Hgb A1c MFr Bld: 14.4 % — ABNORMAL HIGH (ref 4.6–6.5)

## 2010-09-11 MED ORDER — LOSARTAN POTASSIUM-HCTZ 50-12.5 MG PO TABS: 1.0000 | ORAL_TABLET | Freq: Every day | ORAL | Status: DC

## 2010-09-11 MED ORDER — INSULIN LISPRO PROT & LISPRO (75-25 MIX) 100 UNIT/ML ~~LOC~~ SUSP: 30.0000 [IU] | Freq: Two times a day (BID) | SUBCUTANEOUS | Status: DC

## 2010-09-11 NOTE — Assessment & Plan Note (Signed)
Complicated by multiple providers - [facility] and [facility] Plans to now follow only here -  Will change Lantus to humalog 75/25 mix bid -  check lab now and f/u 3 mo, sooner if problems or cbg>200 - pt agrees Lab Results  Component Value Date   HGBA1C 11.6* 05/29/2009

## 2010-09-11 NOTE — Patient Instructions (Signed)
It was good to see you today. Test(s) ordered today. Your results will be called to you after review (48-72hours after test completion). If any changes need to be made, you will be notified at that time. Stop Lantus and use Humalog 75/25 30units 2x/day for diabetes - Use losartan hct in place of previous lisinopril hct - Your prescription(s) have been submitted to your pharmacy. Please take as directed and contact our office if you believe you are having problem(s) with the medication(s). we'll make referral to eye doctor for check. Our office will contact you regarding appointment(s) once made. Please schedule followup in 3-4 months for diabetes and blood pressure check, call sooner if problems.

## 2010-09-11 NOTE — Assessment & Plan Note (Signed)
Off ACEI due to ?swelling rxn -  Will add ARB due to DM and continue with hctz for BP control as before BP Readings from Last 3 Encounters:  09/11/10 132/82  06/10/10 142/82  12/02/09 140/68

## 2010-09-11 NOTE — Assessment & Plan Note (Signed)
On statin years without adverse reaction - dosing reveiwed Check lab now - The current medical regimen is effective;  continue present plan and medications.

## 2010-09-11 NOTE — Progress Notes (Signed)
Subjective:    Patient ID:  Max [name], male    DOB: [birth year], 65 y.o.   MRN: [id number]  HPI Here for follow up DM2 - [facility] by [facility] - ?resume humalog pen in place of Lantus also still taking metformin - no hypoglycemia symptoms or sugars >200s  HTN -  reports compliance with ongoing medical treatment; [facility] by [facility] reviewed. denies adverse side effects related to current therapy.   dyslipidemia - on statin for years - no GI or muscle symptoms   Also reviewed chronic medical issues: hx left ankle abscess and deep joint infection s/p I& D  07/2009 with MSSA cx- s/p PICC IV abx and oral abx course - all symptoms resolved - ankle "normal again" - no swelling, no pain, no fever or redness   skin condition - ? [facility] seeng dr Darcel Bayley  taking hydroxizine and actitretin - some improved still occ itchy dry skin, esp worse after hot shower/bath now dx with ezcema - improved with different lotions  weight loss, unintentional - resolved since fall 2011 since 1st OV here 01/06/09, lost 30# without change in diet or inc in physical activity colo 02/2009 - no malig; CT C/A/P 06/05/2009 neg; BM bx 06/13/2009 nonsp/neg  hx RCC - "cancer free" per his uro - no longer following with uro  Past Medical History  Diagnosis Date  . Eosinophilia   . ANXIETY, SITUATIONAL   . DERMATITIS   . METHICILLIN SUSCEPTIBLE STAPH AUREUS SEPTICEMIA 07/2009  . SEPTIC ARTHRITIS 07/2009    L ankle - MSSA  . DEGENERATIVE DISC DISEASE   . HYPERTENSION   . HYPERLIPIDEMIA   . DIABETES MELLITUS, TYPE II, ON INSULIN, CONTROLLED     Review of Systems  Constitutional: Negative for fever.  Respiratory: Negative for shortness of breath.   Cardiovascular: Negative for chest pain and leg swelling.  Neurological: Negative for weakness and headaches.      Objective:   Physical Exam BP 132/82  Pulse 76  Temp(Src) 98.1 F (36.7 C) (Oral)  Ht 5\' 6"   (1.676 m)  Wt 176 lb 12.8 oz (80.196 kg)  BMI 28.54 kg/m2  SpO2 97% Wt Readings from Last 3 Encounters:  09/11/10 176 lb 12.8 oz (80.196 kg)  06/10/10 180 lb 6.4 oz (81.829 kg)  12/02/09 171 lb 9.6 oz (77.837 kg)   Physical Exam  Constitutional:  oriented to person, place, and time. appears well-developed and well-nourished. No distress.  Neck: Normal range of motion. Neck supple. No JVD present. No thyromegaly present.  Cardiovascular: Normal rate, regular rhythm and normal heart sounds.  No murmur heard. Pulmonary/Chest: Effort normal and breath sounds normal. No respiratory distress. no wheezes.  Skin - diffusely thickened skin, esp extremities - cracking on hands - no erythema or ulcers or hives. Psychiatric: he has a normal mood and affect. behavior is normal. Judgment and thought content normal.   Lab Results  Component Value Date   WBC 9.0 06/20/2010   HGB 15.0 06/20/2010   HCT 43.5 06/20/2010   PLT 220 06/20/2010   CHOL 208* 01/16/2009   TRIG 115.0 01/16/2009   HDL 54.70 01/16/2009   LDLDIRECT 137.6 01/16/2009   ALT 42 09/26/2009   AST 37 09/26/2009   NA 137 06/20/2010   K 4.4 06/20/2010   CL 99 06/20/2010   CREATININE 1.29 06/20/2010   BUN 22 06/20/2010   CO2 27 06/20/2010   TSH 2.20 05/29/2009   HGBA1C 11.6*  05/29/2009       Assessment & Plan:  See problem list. Medications and labs reviewed today.

## 2010-09-12 ENCOUNTER — Telehealth: Payer: Self-pay | Admitting: Internal Medicine

## 2010-09-12 NOTE — Telephone Encounter (Signed)
Please call patient - lab results show terrible DM control over past 3 mo! However, no further medication changes recommended since we changed to insulin 75/25 pen at OV - continue as rx'd and call if cbg >200 before next OV. Thanks.   Lab Results  Component Value Date   HGBA1C 14.4* 09/11/2010

## 2010-09-14 NOTE — Telephone Encounter (Signed)
Pt Notified with lab results & md recommendations.Marland KitchenMarland Kitchen4/16/12@4 :36pm/LMB

## 2010-10-16 NOTE — Op Note (Signed)
NAME:  Max [name], Max [name]              ACCOUNT NO.:  [id number]   MEDICAL RECORD NO.:  [id number]          PATIENT TYPE:  OIB   LOCATION:  3010                         FACILITY:  [facility]   PHYSICIAN:  Kathaleen Maser. Pool, M.D.    DATE OF BIRTH:  1945/11/21   DATE OF PROCEDURE:  06/22/2005  DATE OF DISCHARGE:                                 OPERATIVE REPORT   PREOPERATIVE DIAGNOSES:  Left L4-5 stenosis and left L5-S1 herniated nucleus  pulposus with radiculopathy.   POSTOPERATIVE DIAGNOSES:  Left L4-5 stenosis and left L5-S1 herniated  nucleus pulposus with radiculopathy.   OPERATION PERFORMED:  Left L4-5 decompressive laminotomy and foraminotomy.  Left L5-S1 laminotomy and microdiskectomy.   SURGEON:  Kathaleen Maser. Pool, M.D.   ASSISTANT:  Reinaldo Meeker, M.D.   ANESTHESIA:  General oral endotracheal.   INDICATIONS FOR PROCEDURE:  Max [name] is a 65 year old male who was  experiencing severe back and left lower extremity radicular pain consistent  with a left-sided L5 radiculopathy.  The patient has a marked left-sided  foot drop.  Work-up demonstrates evidence of significant spondylosis and  stenosis at L4-5.  The patient also has evidence of a left-sided L5-S1 disk  herniation with question of possible superior free fragment causing  compression of the left-sided L5 nerve root.  The patient was counseled as  to his options.  He has decided to proceed with a left-sided L4-5 and left-  sided L5-S1 laminotomy and microdiskectomy.   DESCRIPTION OF PROCEDURE:  The patient was taken to the operating room and  placed on the table in the supine position.  After adequate level of  anesthesia was achieved, the patient was positioned prone onto a Wilson  frame and appropriately padded.  The patient's lumbar region was prepped and  draped sterilely.  A 10 blade was used to make a linear skin incision  overlying the L4, 5 and S1 levels.  This was carried down sharply in the  midline.  A subperiosteal  dissection was then performed on the left side  exposing the lamina and facet joints of L4, L5 and S1.  Deep self-retaining  retractor was placed.  Intraoperative x-ray was taken, the levels were  confirmed.  The laminotomy was then performed using high speed drill and  Kerrison rongeurs to remove the inferior aspect of the lamina at L4, the  medial aspect of the L4-5 facet joint, the entire left hemilamina, and the  medial aspect of the left L5-S1 facet joint and the superior rim of the S1  lamina.  Ligamentum flavum was elevated and resected in piecemeal fashion.  Decompressive foraminotomy was then performed along the course of the  exiting left-sided L5 nerve root.   The microscope was brought into the field and used for microdissection.  Starting first at L4-5, the disk space was examined.  The disk was bulging  but not herniated in any way and was not felt to be causing significant  ventral compression of the L5 nerve root.  Attention was then placed to the  L5-S1 level.  The anatomy here was very distorted and very  abnormal.  There  was some degree of conjoined nerve root with a low take off joining the L5  nerve root distally.  Working in the space between these two L5 nerve roots,  we encountered an extremely large disk fragment which was gradually  dissected free and removed.  Following this, the disk space was identified  and an aggressive diskectomy was performed using pituitary rongeurs and  upward angled pituitary rongeurs and Epstein curets.  All loose or obviously  degenerated disk material was removed from the interspace.  The spinal canal  was then inspected. There was no evidence of injury to thecal sac or nerve  roots.  There was no evidence of any remaining compression of the thecal sac  or nerve roots.  The wound was then irrigated with antibiotic solution.  Gelfoam was placed topically for hemostasis which was found to be good.  Microscope and retractor system were  removed.  Hemostasis in the muscle was  achieved with electrocautery.  The wound was then closed in layers with  Vicryl sutures.  Steri-Strips and sterile dressing were applied.  There were  no apparent complications.  The patient tolerated the procedure well and  returned to the recovery room postoperatively.           ______________________________  Kathaleen Maser Pool, M.D.     HAP/MEDQ  D:  06/22/2005  T:  06/23/2005  Job:  [id number]

## 2010-10-16 NOTE — Consult Note (Signed)
NAME:  Max [name], Max [name] NO.:  [id number]   MEDICAL RECORD NO.:  [id number]          PATIENT TYPE:  EMS   LOCATION:  ED                           FACILITY:  [facility]   PHYSICIAN:  Lucrezia Starch. Earlene Plater, M.D.  DATE OF BIRTH:  1945-10-12   DATE OF CONSULTATION:  09/06/2004  DATE OF DISCHARGE:                                   CONSULTATION   ER CONSULT:  Max [name] comes into the ER with a chief complaint of can't  urinate and previously had hematuria and clots.   HISTORY OF PRESENT ILLNESS:  The patient was doing yard work yesterday, then  went out with friends last evening, had several beers, went home and  experienced sharp shooting pain in the left side x1, then thought he had to  urinate, defecate and passed bloody urine and clots.  He experienced  dysuria, frequency, and urgency.  Brought to the ER last night.  Blood work,  CT scan done.  Continued with hematuria and clots.  Returned home with  instructions to [facility] on Monday for an appointment.  Returns to the ER today for increased clots and retention.  The patient  denies history of kidney stones.  Negative family history of kidney stones.  Negative hematuria previously.  No left-sided pain previously and none since  last night.  Catheter attempt x2 in ER without success.   PAST MEDICAL HISTORY:  1.  Insulin-dependent diabetic.  2.  Hypertension.  3.  No recent or distant trauma.   MEDS:  1.  Metformin.  2.  Lipitor.  3.  Lisinopril.  4.  Actos.  5.  Nifedipine.  6.  Insulin.  7.  Glipizide.  8.  Lorazepam.   ALLERGIES:  NONE KNOWN.   SOCIAL HISTORY:  Occasional ETOH use, previous smoker, quit in late 1990s.   FAMILY HISTORY:  Negative for kidney stones.   REVIEW OF SYSTEMS:  Negative chest pain, occasional shortness of breath with  exertion.  Negative fevers. Some nausea, vomiting and diarrhea last night.  No previous urinary difficulties.  Negative neuro symptoms.   PHYSICAL  EXAMINATION:  A 65 year old African-American male mildly sedated in  no acute distress.  Answers questions appropriately.  Wife is at bedside.  VITAL SIGNS:  Temperature 96.9, pulse 119, respirations 22, blood pressure  190/99, sats 98%, CBG 376.  HEENT:  Normocephalic.  NECK:  Supple.  RESPIRATORY:  Clear to auscultation.  CARDIAC:  Regular rate and rhythm.  ABDOMEN:  Soft, mildly distended, mild suprapubic tenderness, positive bowel  sounds, negative CVAT.  GU:  Penis, meatus, scrotum, testicles without lesions, masses or  tenderness.  NEUROLOGIC:  Alert and oriented x3.  Appropriate mood and affect.   LABS:  WBC 11.6, H&H 12.0, 35.6, platelets 210.  PT 17.2, INR 0.9, PTT 22,  sodium 137, potassium 3.9, chloride 104, carbon dioxide 25, BUN 12,  creatinine 1.1, sugar 427, alkaline phosphatase 81, SGOT 20, SGPT 17, total  protein 7.0, albumin 3.7, calcium 9.1.  Urinalysis with micro show rare  wbcs, rbcs too numerous to count, rare bacteria, glucose greater than  1000  mg per deciliter, trace ketones, large protein greater than 300 mg per  deciliter, nitrate negative, and leukocytes small.  CT scan urogram shows  abdominal impression of abnormal appearance of left kidney and proximal left  ureter, may be related to left UPJ obstruction with decompression or  nonradiopaque stone or tumor with decompression.  Atherosclerotic type  changes of abdominal aneurysm.  Pelvic impression -- enlarged prostate gland  6.9 x 6.4 cm.   PROCEDURE:  A 14 French Coude placed easily with 200-300 mL of bloody urine  returned, irrigated easily without clot returning, urine remains bloody even  with irrigation.   IMPRESSION:  1.  Clot retention.  2.  Diabetes.  3.  Hypertension.  4.  Abnormal abdominal scan, questionable tumor, results as above.   PLAN:  Return to office on Tuesday for followup.  We will schedule Cysto  retrograde pyelogram.  Needs followup with Dr. Lorenz Coaster for hypertension,   diabetes.  The patient states he will make an appointment next week.  Keflex  500 mg #1 p.o. b.i.d. #14.      JML/MEDQ  D:  09/06/2004  T:  09/06/2004  Job:  [id number]
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
## 2010-10-16 NOTE — Op Note (Signed)
NAME:  Max [name], Max [name]              ACCOUNT NO.:  [id number]   MEDICAL RECORD NO.:  [id number]          PATIENT TYPE:  AMB   LOCATION:  [facility]                         FACILITY:  [facility]   PHYSICIAN:  Ronald L. Earlene Plater, M.D.  DATE OF BIRTH:  Jun 16, 1945   DATE OF PROCEDURE:  09/25/2004  DATE OF DISCHARGE:                                 OPERATIVE REPORT   PREOPERATIVE DIAGNOSES:  1.  Gross hematuria.  2.  Abnormalities on CT scan.   OPERATION/PROCEDURE:  1.  Cystourethroscopy.  2.  Left retrograde ureteral pyelogram.  3.  Left flexible ureteroscopy.  4.  Left renal pelvic washings for cytology.  5.  Placement of left double-J stent.   SURGEON:  Lucrezia Starch. Earlene Plater, M.D.   ANESTHESIA:  LMA.   ESTIMATED BLOOD LOSS:  Negligible.   TUBES:  26 cm, 6-French Cook double pigtail stent.   COMPLICATIONS:  None.   INDICATIONS:  Mr. Bartoletti is a very nice 65 year old black male who presented  with gross hematuria and clot retention.  He was seen in the emergency room  and on CT scan was found to have abnormal appearance of the left kidney and  proximal ureter.  It was felt that it could be a left UPJ obstruction, non-  radiopaque stone or tumor.  He does have significant prostatic gland  enlargement and on cystourethroscopy, had grade 2 trabeculation with  trilobar hypertrophy and some hemorrhagic areas of the left base in the  right lateral areas with some small clots but no specific abnormalities were  seen.   DESCRIPTION OF PROCEDURE:  The patient was placed in the supine position.  After proper LMA anesthesia, was placed in the dorsal lithotomy position and  prepped and draped with Betadine in sterile fashion.  Cystourethroscopy was  done with a 22.5-French, Olympus panendoscope.  He was noted to have a  filamentous, deep, bulbar urethral stricture which was easily negotiated and  had significant trilobar hypertrophy.  There was grade 1-2 trabeculation of  the bladder and efflux of clear  urine was noted from the normally placed  ureteral orifices bilaterally.  A left retrograde ureteral pyelogram was  performed with a 6-French, open-ended catheter and there did appear to be a  filling defect in the left renal pelvis although it was somewhat difficult  to discern and the upper ureter did appear to be quite narrowed.  A 0.03-  French Teflon-coated guidewire placed in the left renal pelvis and the open-  ended catheter was placed into the left renal pelvis.  The urine was noted  to be clear and barbotage left renal cytologies were obtained with normal  saline and submitted for cytology.  A second wire was placed and the  ureteral orifices quite tight.  Therefore, the distal ureter was balloon  dilated with a 10 cm 5 mm ureteral balloon dilator to 10 atmospheres of  pressure for two minutes.  Attempted flexible ureteroscopy was performed and  again in the mid ureter it was quite tight and the flexible ureteroscope  could not be negotiated.  It was felt that further ureteroscopy would not  be  safe.  Dye was injected through it again and there was no extravasation  noted.  It was felt for safety purposes that placement of a stent and return  in one to two weeks after the system had dilated would be most appropriate.  Therefore, under fluoroscopic guidance a 26 cm, 6-French, Cook, double  pigtail stent was placed and noted to be in good position within the left  renal pelvis within the bladder.  No pull-out string was attached.  Bladder  was drained and panendoscope was removed.  The plan is to come back in two  weeks for a ureteroscopy to hopefully visualize and biopsy the left upper  ureter and renal pelvic lesions.      RLD/MEDQ  D:  09/25/2004  T:  09/25/2004  Job:  [id number]

## 2010-10-16 NOTE — Op Note (Signed)
NAME:  CHRISTO, HAIN NO.:  [id number]   MEDICAL RECORD NO.:  [id number]          PATIENT TYPE:  EMS   LOCATION:  ED                           FACILITY:  [facility]   PHYSICIAN:  Lucrezia Starch. Earlene Plater, M.D.  DATE OF BIRTH:  [birth year]   DATE OF PROCEDURE:  09/06/2004  DATE OF DISCHARGE:                                 OPERATIVE REPORT   DIAGNOSIS:  Urinary clot retention.   OPERATIVE PROCEDURE:  Placement of 22-French Coude catheter and irrigation  of clots.   SURGEON:  Lucrezia Starch. Earlene Plater, M.D.   ANESTHESIA:  Local.   INDICATIONS:  Mr. Banet is a very nice 65 year old black male who presented  to the emergency room with clot retention last evening.  He underwent a CT  scan which revealed some abnormalities of the left kidney in the  ureteropelvic junction area, but he was not in retention.  He subsequently  represented to the emergency room in urinary clot retention.  He was seen by  Alessandra Bevels. Lineberry, FNP-C, and a 14-French Coude catheter was passed.  Approximately 300 cc of blood-tinged urine was obtained, and some of his  pain decreased.  However. the bladder was still palpable.  The 14-French  Foley catheter was removed, and a 22-French Coude catheter was passed and  utilizing a Toomey syringe and sterile water, approximately 200 cc of old  clots were irrigated.  The bladder irrigated clear.  The catheter was  maintained in place, and the plan is to see him in the office later in the  week and plan on outpatient cystourethroscopy, retrograde ureteropyelograms,  and ureteroscopy.      RLD/MEDQ  D:  09/06/2004  T:  09/06/2004  Job:  [id number]

## 2010-10-16 NOTE — Op Note (Signed)
NAME:  Max [name], Max [name]              ACCOUNT NO.:  [id number]   MEDICAL RECORD NO.:  [id number]          PATIENT TYPE:  AMB   LOCATION:  [facility]                         FACILITY:  [facility]   PHYSICIAN:  Ronald L. Earlene Plater, M.D.  DATE OF BIRTH:  1946-04-13   DATE OF PROCEDURE:  10/09/2004  DATE OF DISCHARGE:                                 OPERATIVE REPORT   PREOPERATIVE DIAGNOSIS:  Gross hematuria, questionable left ureteral lesion.   OPERATION/PROCEDURE:  1.  Cystourethroscopy.  2.  Left retrograde ureteral pyelogram.  3.  Left rigid and flexible ureterorenoscopy.  4.  Removal of left double-J stent.   SURGEON:  Lucrezia Starch. Earlene Plater, M.D.   ASSISTANT:  Alessandra Bevels. Chase Picket, FNP-C.   ANESTHESIA:  LMA.   ESTIMATED BLOOD LOSS:  Negligible.   TUBES:  None.   COMPLICATIONS:  None.   INDICATIONS FOR PROCEDURE:  Max [name] is a very nice 65 year old black male  who presented with gross hematuria and clot retention.  On work-up, on the  CT scan, he was found to have an abnormal appearance of the left kidney and  proximal ureter.  We thought it might be a left ureteropelvic junction  obstruction with decompression caused by radiopaque stone or tumor.  He  underwent cystourethroscopy and bilateral retrograde ureteral pyelograms and  on the left side there was some narrowing of the ureter.  There was also a  filamentous ureteral stricture that was dilated.  A left double-J stent was  placed approximately two weeks ago and he is back now for ureteroscopy in  that it could not be performed at that time safely.   DESCRIPTION OF PROCEDURE:  The patient was placed in the supine position.  After proper LMA anesthesia, was placed in the dorsal lithotomy position,  prepped and draped in the with Betadine in sterile fashion.  Cystourethroscopy was performed.  There is moderate trilobar hypertrophy.  The stent was visualized and pulled to the meatus and a 0.038-French glide  wire was placed in the left  renal pelvis. A 6 French open-ended catheter was  then placed in the left renal pelvis.  Dye was injected and no frank filling  defects were noted.  A 0.038-French, Teflon-coated guidewire was placed in  the left renal pelvis and the opening catheter was removed and utilizing a  dual lumen inserter, this was inserted easily up to the renal pelvis and a  second 0.038-French, Teflon-coated guidewire was placed.  Ureteroscopy was  performed with a short, thin, semi-rigid ureteroscope up the distal two-  thirds ureter and no abnormalities were noted of the ureter except some  inflammation and mucus debris from having the stent present.  It was felt  that flexible ureterorenoscopy was probably indicated and utilizing one of  the 0.038-French Teflon-coated guidewires, the uretero-renoscope was placed  into the left renal pelvis and the wire was removed and the safety wire was  maintained.  Inspection of the entire pelvis revealed no lesions.  The  upper, middle and lower calyces were carefully searched.  There was some  slight infundibular stenosis.  I could not get the  scope to the distal  portion of each of the calyces but I saw no abnormalities and no filling  defects previously.  The flexible ureterorenoscope was then visually removed  down the entire ureter and no filling defects or abnormalities were noted.  It was felt that he was widely dilated and the stent could be removed.  He  was given Toradol 30 mg IV and the guidewire was removed.  The bladder was  drained.  The patient was taken to the recovery room stable.      RLD/MEDQ  D:  10/09/2004  T:  10/09/2004  Job:  [id number]

## 2010-10-16 NOTE — Discharge Summary (Signed)
NAME:  Max [name], BOY NO.:  [id number]   MEDICAL RECORD NO.:  [id number]          PATIENT TYPE:  INP   LOCATION:  1415                         FACILITY:  [facility]   PHYSICIAN:  Valetta Fuller, M.D.  DATE OF BIRTH:  Jul 31, 1945   DATE OF ADMISSION:  01/25/2005  DATE OF DISCHARGE:  01/28/2005                                 DISCHARGE SUMMARY   DISCHARGE DIAGNOSIS:  Renal cell carcinoma stage P-T-1A.   PROCEDURE:  Hand assisted left partial nephrectomy on 01/25/2005.   HOSPITAL COURSE:  Max [name] is a 65 year old male. He was originally sent to  Dr. Darvin Neighbours in our practice for assessment of some gross hematuria. The  patient also had some voiding dysfunction at that time. He underwent  extensive evaluation and no obvious source of the hematuria was ever  established. This included cystoscopy with ureteroscopy with cytology and  multiple abdominal imaging. Of note, however, the patient was found to have  a  1-1/2 cm peripherally based solid left renal mass. This was consistent with  small renal cell carcinoma. Given the location and size of the tumor it was  fairly inconceivable that this was the cause of the patient's gross  hematuria but nonetheless there was significant pathology that needed to be  addressed. Again all other assessment of his hematuria failed to reveal any  obvious source. The patient was eventually sent for my evaluation. We felt  that this lesion was amenable to partial nephrectomy by hand assisted  laparoscopic approach. On 01/25/2005 the patient underwent renal exploration  on the left. Indeed a 1-1/2 cm solid lesion was noted on the posterior  aspect of left kidney. A partial nephrectomy was performed. Final pathology  revealed a 1.4 cm tumor that appeared to be a clear cell renal cell  carcinoma. Nuclear grade was 204. Biopsies of the tumor base were negative.  The patient's hospital course was relatively unremarkable.   On postoperative  day one the patient's hemoglobin was 10.1 with a creatinine  of 1.2.  Jackson-Pratt output remained minimal throughout the entire  postoperative course.  His urine output remained excellent. He had a mild  ileus that resolved by postoperative day two.  By postoperative day three he  was afebrile with normal vital signs, voiding quite well, having normal  bowel movements, ambulating well, and having minimal pain.   DISPOSITION:  The patient is to be discharged to home. He will follow up in  our office in approximately a week for staple removal. He will be given a  prescription for Vicodin and instructed to take all his other medications.  He will resume other medicines. We did discuss the limitations of activity.           ______________________________  Valetta Fuller, M.D.     DSG/MEDQ  D:  01/28/2005  T:  01/28/2005  Job:  [id number]   cc:   Reuben Likes, M.D.  [address]  [place]  [place] [address]  Fax: [phone]

## 2010-12-08 ENCOUNTER — Encounter: Payer: Self-pay | Admitting: Internal Medicine

## 2010-12-09 ENCOUNTER — Other Ambulatory Visit: Payer: Self-pay | Admitting: Internal Medicine

## 2010-12-09 MED ORDER — AMLODIPINE BESYLATE 10 MG PO TABS: 10.0000 mg | ORAL_TABLET | Freq: Every day | ORAL | Status: DC

## 2010-12-14 ENCOUNTER — Ambulatory Visit: Payer: Medicare Other | Admitting: Internal Medicine

## 2011-10-03 ENCOUNTER — Other Ambulatory Visit: Payer: Self-pay | Admitting: Internal Medicine

## 2012-01-06 ENCOUNTER — Other Ambulatory Visit: Payer: Self-pay | Admitting: Internal Medicine

## 2012-01-14 ENCOUNTER — Other Ambulatory Visit: Payer: Self-pay | Admitting: Internal Medicine

## 2012-02-02 ENCOUNTER — Encounter: Payer: Self-pay | Admitting: Internal Medicine

## 2012-02-02 ENCOUNTER — Other Ambulatory Visit (INDEPENDENT_AMBULATORY_CARE_PROVIDER_SITE_OTHER): Payer: Medicare Other

## 2012-02-02 ENCOUNTER — Ambulatory Visit (INDEPENDENT_AMBULATORY_CARE_PROVIDER_SITE_OTHER): Payer: Medicare Other | Admitting: Internal Medicine

## 2012-02-02 VITALS — BP 130/62 | HR 59 | Temp 98.6°F | Ht 66.0 in | Wt 177.0 lb

## 2012-02-02 DIAGNOSIS — E785 Hyperlipidemia, unspecified: Secondary | ICD-10-CM

## 2012-02-02 DIAGNOSIS — E119 Type 2 diabetes mellitus without complications: Secondary | ICD-10-CM

## 2012-02-02 DIAGNOSIS — Z79899 Other long term (current) drug therapy: Secondary | ICD-10-CM

## 2012-02-02 DIAGNOSIS — I10 Essential (primary) hypertension: Secondary | ICD-10-CM

## 2012-02-02 LAB — LDL CHOLESTEROL, DIRECT: Direct LDL: 143.2 mg/dL

## 2012-02-02 LAB — HEMOGLOBIN A1C: Hgb A1c MFr Bld: 11.2 % — ABNORMAL HIGH (ref 4.6–6.5)

## 2012-02-02 LAB — BASIC METABOLIC PANEL
BUN: 17 mg/dL (ref 6–23)
Calcium: 9.1 mg/dL (ref 8.4–10.5)
Creatinine, Ser: 1 mg/dL (ref 0.4–1.5)
GFR: 93.03 mL/min (ref >60.00)
Glucose, Bld: 229 mg/dL — ABNORMAL HIGH (ref 70–99)
Potassium: 4.1 mEq/L (ref 3.5–5.1)

## 2012-02-02 LAB — LIPID PANEL
Cholesterol: 229 mg/dL — ABNORMAL HIGH (ref 0–200)
HDL: 65.2 mg/dL (ref >39.00)
Triglycerides: 161 mg/dL — ABNORMAL HIGH (ref 0.0–149.0)
VLDL: 32.2 mg/dL (ref 0.0–40.0)

## 2012-02-02 LAB — HEPATIC FUNCTION PANEL
Albumin: 4 g/dL (ref 3.5–5.2)
Total Bilirubin: 0.4 mg/dL (ref 0.3–1.2)

## 2012-02-02 MED ORDER — INSULIN LISPRO PROT & LISPRO (75-25 MIX) 100 UNIT/ML ~~LOC~~ SUSP: 30.0000 [IU] | Freq: Two times a day (BID) | SUBCUTANEOUS | Status: DC

## 2012-02-02 MED ORDER — LOSARTAN POTASSIUM-HCTZ 50-12.5 MG PO TABS: 1.0000 | ORAL_TABLET | Freq: Every day | ORAL | Status: DC

## 2012-02-02 MED ORDER — AMLODIPINE BESYLATE 10 MG PO TABS: 10.0000 mg | ORAL_TABLET | Freq: Every day | ORAL | Status: DC

## 2012-02-02 MED ORDER — SIMVASTATIN 80 MG PO TABS: 40.0000 mg | ORAL_TABLET | Freq: Every day | ORAL | Status: DC

## 2012-02-02 NOTE — Assessment & Plan Note (Signed)
Off ACEI due to ?swelling rxn -  added ARB 08/2010 due to DM  continue with hctz for BP control as before BP Readings from Last 3 Encounters:  02/02/12 130/62  09/11/10 132/82  06/10/10 142/82

## 2012-02-02 NOTE — Assessment & Plan Note (Signed)
On high dose simvastatin years without adverse reaction - dosing reveiwed Check lab now - The current medical regimen is effective;  continue present plan and medications.

## 2012-02-02 NOTE — Patient Instructions (Signed)
It was good to see you today. We have reviewed your prior records including labs and tests today Test(s) ordered today. Your results will be called to you after review (48-72hours after test completion). If any changes need to be made, you will be notified at that time. Medications reviewed, no changes at this time. Refill on medication(s) as discussed today. Continue working with the [place] as ongoing Please schedule followup in 6-12 months for diabetes mellitus check and review, call sooner if problems.

## 2012-02-02 NOTE — Assessment & Plan Note (Signed)
Complicated by multiple providers - [facility] and [facility] Plans to now follow for diabetes mellitus here -  changed Lantus to humalog 75/25 mix bid at pt preference 08/2010 - takes in addition to metformin check lab now and follow up 3 mo, sooner if problems or cbg>200 - pt agrees Also on statin, ARB, ASA 81 Lab Results  Component Value Date   HGBA1C 14.4* 09/11/2010

## 2012-02-02 NOTE — Progress Notes (Signed)
Subjective:    Patient ID: Max [name], male    DOB: 06-12-1945, 66 y.o.   MRN: [id number]  HPI  Here for follow up - reviewed chronic medical issues: last OV 08/2010 - usually follows at [facility]  DM2 - [facility] by [facility] - prefers humalog pen in place of Lantus also still taking metformin - no hypoglycemia symptoms or sugars >200s  HTN - reports compliance with ongoing medical treatment; [facility] in medications by [facility] reviewed. denies adverse side effects related to current therapy.   dyslipidemia - on statin for years - no GI or muscle symptoms   hx left ankle abscess and deep joint infection s/p I& D  07/2009 with MSSA bacteremia- s/p PICC IV and oral antibiotics course - all symptoms resolved - ankle "normal again" - no swelling, no pain, no fever or redness   skin condition - ? psoriasis [facility]- also dr Darcel Bayley  taking hydroxizine and actitretin - some improved still itchy dry skin, especially worse after hot shower/bath Current dx = ezcema - improved with different steroid lotions and tanning booth  weight loss, unintentional - resolved since fall 2011 since 1st OV here 01/06/09, lost 30# without change in diet or inc in physical activity colo 02/2009 - no malig; CT C/A/P 06/05/2009 neg; BM bx 06/13/2009 nonsp/neg  RCC hx 12/2004 partial nephrectomy treatment for same- "cancer free" per his uro - no longer following with uro  Past Medical History  Diagnosis Date  . Eosinophilia   . ANXIETY, SITUATIONAL   . DERMATITIS   . SEPTIC ARTHRITIS 07/2009    L ankle - MSSA  . DEGENERATIVE DISC DISEASE   . HYPERTENSION   . HYPERLIPIDEMIA   . DIABETES MELLITUS, TYPE II, ON INSULIN, CONTROLLED   . LOW BACK PAIN   . PRURITUS 2011  . RENAL CELL CANCER 01/25/2005    s/p partial nephrectomy  . SHOULDER PAIN, RIGHT, CHRONIC     Review of Systems  Constitutional: Negative for fever.  Respiratory: Negative for shortness of breath.   Cardiovascular: Negative for chest  pain and leg swelling.  Neurological: Negative for weakness and headaches.      Objective:   Physical Exam  BP 130/62  Pulse 59  Temp 98.6 F (37 C) (Oral)  Ht 5\' 6"  (1.676 m)  Wt 177 lb (80.287 kg)  BMI 28.57 kg/m2  SpO2 98% Wt Readings from Last 3 Encounters:  02/02/12 177 lb (80.287 kg)  09/11/10 176 lb 12.8 oz (80.196 kg)  06/10/10 180 lb 6.4 oz (81.829 kg)  Constitutional:  He appears well-developed and well-nourished. No distress.  Neck: Normal range of motion. Neck supple. No JVD present. No thyromegaly present.  Cardiovascular: Normal rate, regular rhythm and normal heart sounds.  No murmur heard. no BLE edema Pulmonary/Chest: Effort normal and breath sounds normal. No respiratory distress. no wheezes.  Abdominal: Soft. Bowel sounds are normal. Patient exhibits no distension. There is no tenderness.  Musculoskeletal: Normal range of motion. Patient exhibits no edema.  Neurological: he is alert and oriented to person, place, and time. No cranial nerve deficit. Coordination normal.  Skin - diffusely thickened skin, esp extremities - cracking on hands - no erythema or ulcers or hives. Psychiatric: he has a normal mood and affect. behavior is normal. Judgment and thought content normal.   Lab Results  Component Value Date   WBC 9.0 06/20/2010   HGB 15.0 06/20/2010   HCT 43.5 06/20/2010   PLT 220 06/20/2010  CHOL 196 09/11/2010   TRIG 190.0* 09/11/2010   HDL 47.20 09/11/2010   LDLDIRECT 137.6 01/16/2009   ALT 12 09/11/2010   AST 14 09/11/2010   NA 137 06/20/2010   K 4.4 06/20/2010   CL 99 06/20/2010   CREATININE 1.0 09/11/2010   BUN 22 06/20/2010   CO2 27 06/20/2010   TSH 2.20 05/29/2009   HGBA1C 14.4* 09/11/2010       Assessment & Plan:   See problem list. Medications and labs reviewed today.

## 2012-02-04 ENCOUNTER — Telehealth: Payer: Self-pay | Admitting: *Deleted

## 2012-02-04 DIAGNOSIS — E785 Hyperlipidemia, unspecified: Secondary | ICD-10-CM

## 2012-02-04 MED ORDER — ATORVASTATIN CALCIUM 40 MG PO TABS: 40.0000 mg | ORAL_TABLET | Freq: Every day | ORAL | Status: DC

## 2012-02-04 NOTE — Telephone Encounter (Signed)
Patient should be taking simvastatin 40 mg (1/2 of 80mg  tab) with his current amlodipine dose - we previously reduced simvastatin from 80 mg to 40mg  because of this potential interaction   At this 40mg  simva dose, I feel patient is at low risk for adverse reaction -   However, will change simva 40 to atorva 40 - new erx done

## 2012-02-04 NOTE — Telephone Encounter (Signed)
Notified pharmacy with med change...Raechel Chute

## 2012-02-04 NOTE — Telephone Encounter (Signed)
Left msg on vm stating drug interaction when taking simvastatin & amlodipine when taking together on higher dose. Requesting md advisement...Raechel Chute

## 2012-02-16 ENCOUNTER — Other Ambulatory Visit: Payer: Self-pay | Admitting: Internal Medicine

## 2012-02-17 ENCOUNTER — Other Ambulatory Visit: Payer: Self-pay | Admitting: General Practice

## 2012-03-06 ENCOUNTER — Emergency Department (HOSPITAL_COMMUNITY)
Admission: EM | Admit: 2012-03-06 | Discharge: 2012-03-06 | Disposition: A | Discharge status: Home / Self Care | Payer: Medicare Other | Attending: Emergency Medicine | Admitting: Emergency Medicine

## 2012-03-06 ENCOUNTER — Emergency Department (HOSPITAL_COMMUNITY): Payer: Medicare Other

## 2012-03-06 ENCOUNTER — Encounter (HOSPITAL_COMMUNITY): Payer: Self-pay | Admitting: *Deleted

## 2012-03-06 DIAGNOSIS — Z794 Long term (current) use of insulin: Secondary | ICD-10-CM | POA: Insufficient documentation

## 2012-03-06 DIAGNOSIS — J069 Acute upper respiratory infection, unspecified: Secondary | ICD-10-CM

## 2012-03-06 DIAGNOSIS — J3489 Other specified disorders of nose and nasal sinuses: Secondary | ICD-10-CM | POA: Insufficient documentation

## 2012-03-06 DIAGNOSIS — I10 Essential (primary) hypertension: Secondary | ICD-10-CM | POA: Insufficient documentation

## 2012-03-06 DIAGNOSIS — E785 Hyperlipidemia, unspecified: Secondary | ICD-10-CM | POA: Insufficient documentation

## 2012-03-06 DIAGNOSIS — E119 Type 2 diabetes mellitus without complications: Secondary | ICD-10-CM | POA: Insufficient documentation

## 2012-03-06 DIAGNOSIS — R739 Hyperglycemia, unspecified: Secondary | ICD-10-CM

## 2012-03-06 DIAGNOSIS — R05 Cough: Secondary | ICD-10-CM | POA: Insufficient documentation

## 2012-03-06 DIAGNOSIS — Z7982 Long term (current) use of aspirin: Secondary | ICD-10-CM | POA: Insufficient documentation

## 2012-03-06 DIAGNOSIS — R490 Dysphonia: Secondary | ICD-10-CM | POA: Insufficient documentation

## 2012-03-06 DIAGNOSIS — Z79899 Other long term (current) drug therapy: Secondary | ICD-10-CM | POA: Insufficient documentation

## 2012-03-06 DIAGNOSIS — R059 Cough, unspecified: Secondary | ICD-10-CM | POA: Insufficient documentation

## 2012-03-06 DIAGNOSIS — IMO0002 Reserved for concepts with insufficient information to code with codable children: Secondary | ICD-10-CM | POA: Insufficient documentation

## 2012-03-06 DIAGNOSIS — R079 Chest pain, unspecified: Secondary | ICD-10-CM | POA: Insufficient documentation

## 2012-03-06 LAB — CBC
HCT: 38.1 % — ABNORMAL LOW (ref 39.0–52.0)
Hemoglobin: 12.2 g/dL — ABNORMAL LOW (ref 13.0–17.0)
MCHC: 32 g/dL (ref 30.0–36.0)
RBC: 4.06 MIL/uL — ABNORMAL LOW (ref 4.22–5.81)
WBC: 7.1 10*3/uL (ref 4.0–10.5)

## 2012-03-06 LAB — BASIC METABOLIC PANEL
BUN: 19 mg/dL (ref 6–23)
CO2: 29 mEq/L (ref 19–32)
Chloride: 99 mEq/L (ref 96–112)
GFR calc non Af Amer: 71 mL/min — ABNORMAL LOW (ref >90)
Glucose, Bld: 348 mg/dL — ABNORMAL HIGH (ref 70–99)
Potassium: 4.1 mEq/L (ref 3.5–5.1)
Sodium: 137 mEq/L (ref 135–145)

## 2012-03-06 LAB — GLUCOSE, CAPILLARY: Glucose-Capillary: 296 mg/dL — ABNORMAL HIGH (ref 70–99)

## 2012-03-06 MED ORDER — BENZONATATE 100 MG PO CAPS: 100.0000 mg | ORAL_CAPSULE | Freq: Three times a day (TID) | ORAL | Status: DC

## 2012-03-06 MED ORDER — HYDROCOD POLST-CHLORPHEN POLST 10-8 MG/5ML PO LQCR: 5.0000 mL | Freq: Two times a day (BID) | ORAL | Status: DC | PRN

## 2012-03-06 MED ORDER — BENZONATATE 100 MG PO CAPS
200.0000 mg | ORAL_CAPSULE | Freq: Once | ORAL | Status: AC
  Administered 2012-03-06: 200 mg via ORAL
  Filled 2012-03-06: qty 2
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
## 2013-02-17 ENCOUNTER — Other Ambulatory Visit: Payer: Self-pay | Admitting: Internal Medicine

## 2013-07-04 ENCOUNTER — Encounter: Payer: Self-pay | Admitting: Internal Medicine

## 2013-07-04 ENCOUNTER — Ambulatory Visit (INDEPENDENT_AMBULATORY_CARE_PROVIDER_SITE_OTHER): Payer: Medicare Other | Admitting: Internal Medicine

## 2013-07-04 ENCOUNTER — Other Ambulatory Visit (INDEPENDENT_AMBULATORY_CARE_PROVIDER_SITE_OTHER): Payer: Medicare Other

## 2013-07-04 VITALS — BP 120/72 | HR 90 | Temp 98.7°F | Wt 178.6 lb

## 2013-07-04 DIAGNOSIS — E1165 Type 2 diabetes mellitus with hyperglycemia: Secondary | ICD-10-CM

## 2013-07-04 DIAGNOSIS — IMO0001 Reserved for inherently not codable concepts without codable children: Secondary | ICD-10-CM

## 2013-07-04 DIAGNOSIS — R197 Diarrhea, unspecified: Secondary | ICD-10-CM

## 2013-07-04 DIAGNOSIS — I10 Essential (primary) hypertension: Secondary | ICD-10-CM

## 2013-07-04 LAB — CBC WITH DIFFERENTIAL/PLATELET
Basophils Absolute: 0 10*3/uL (ref 0.0–0.1)
Basophils Relative: 0.5 % (ref 0.0–3.0)
EOS PCT: 10.3 % — AB (ref 0.0–5.0)
Eosinophils Absolute: 0.5 10*3/uL (ref 0.0–0.7)
HEMATOCRIT: 39.9 % (ref 39.0–52.0)
Hemoglobin: 13.1 g/dL (ref 13.0–17.0)
LYMPHS ABS: 1.2 10*3/uL (ref 0.7–4.0)
LYMPHS PCT: 24.2 % (ref 12.0–46.0)
MCHC: 32.8 g/dL (ref 30.0–36.0)
MCV: 90.3 fl (ref 78.0–100.0)
MONOS PCT: 13.5 % — AB (ref 3.0–12.0)
Monocytes Absolute: 0.7 10*3/uL (ref 0.1–1.0)
NEUTROS PCT: 51.5 % (ref 43.0–77.0)
Neutro Abs: 2.6 10*3/uL (ref 1.4–7.7)
PLATELETS: 227 10*3/uL (ref 150.0–400.0)
RBC: 4.42 Mil/uL (ref 4.22–5.81)
RDW: 14.7 % — ABNORMAL HIGH (ref 11.5–14.6)
WBC: 5 10*3/uL (ref 4.5–10.5)

## 2013-07-04 LAB — BASIC METABOLIC PANEL
BUN: 23 mg/dL (ref 6–23)
CALCIUM: 9 mg/dL (ref 8.4–10.5)
CO2: 21 mEq/L (ref 19–32)
Chloride: 107 mEq/L (ref 96–112)
Creatinine, Ser: 1.3 mg/dL (ref 0.4–1.5)
GFR: 69.57 mL/min (ref >60.00)
Glucose, Bld: 64 mg/dL — ABNORMAL LOW (ref 70–99)
POTASSIUM: 5.2 meq/L — AB (ref 3.5–5.1)
Sodium: 138 mEq/L (ref 135–145)

## 2013-07-04 LAB — HEPATIC FUNCTION PANEL
ALT: 16 U/L (ref 0–53)
AST: 22 U/L (ref 0–37)
Albumin: 4.1 g/dL (ref 3.5–5.2)
Alkaline Phosphatase: 73 U/L (ref 39–117)
BILIRUBIN DIRECT: 0 mg/dL (ref 0.0–0.3)
TOTAL PROTEIN: 8.2 g/dL (ref 6.0–8.3)
Total Bilirubin: 0.5 mg/dL (ref 0.3–1.2)

## 2013-07-04 LAB — LIPID PANEL
CHOLESTEROL: 154 mg/dL (ref 0–200)
HDL: 34.2 mg/dL — AB (ref >39.00)
LDL CALC: 87 mg/dL (ref 0–99)
TRIGLYCERIDES: 162 mg/dL — AB (ref 0.0–149.0)
Total CHOL/HDL Ratio: 5
VLDL: 32.4 mg/dL (ref 0.0–40.0)

## 2013-07-04 LAB — MICROALBUMIN / CREATININE URINE RATIO
Creatinine,U: 206.4 mg/dL
Microalb Creat Ratio: 2.3 mg/g (ref 0.0–30.0)
Microalb, Ur: 4.7 mg/dL — ABNORMAL HIGH (ref 0.0–1.9)

## 2013-07-04 LAB — HEMOGLOBIN A1C: Hgb A1c MFr Bld: 10.3 % — ABNORMAL HIGH (ref 4.6–6.5)

## 2013-07-04 MED ORDER — INSULIN LISPRO PROT & LISPRO (75-25 MIX) 100 UNIT/ML ~~LOC~~ SUSP: 35.0000 [IU] | Freq: Two times a day (BID) | SUBCUTANEOUS | Status: DC

## 2013-07-04 NOTE — Progress Notes (Signed)
Max [name] [id number] 07/04/2013   Chief Complaint  Patient presents with  . GI Problem    Subjective  GI Problem The primary symptoms include fatigue and diarrhea. Primary symptoms do not include fever, abdominal pain, nausea, vomiting, melena, dysuria, myalgias or arthralgias. The illness began 3 to 5 days ago. The onset was sudden (Patient took laxative Saturday which preceded the symptons.). The problem has been rapidly improving.  The fatigue began 3 to 5 days ago. The fatigue has been improving since its onset.  The diarrhea began 3 to 5 days ago. The diarrhea is watery and semi-solid. The diarrhea occurs 5 to 10 times per day. Risk factors: Recent laxative use.  The illness does not include chills, anorexia, bloating, constipation or back pain.    Past Medical History  Diagnosis Date  . Eosinophilia   . ANXIETY, SITUATIONAL   . DERMATITIS   . SEPTIC ARTHRITIS 07/2009    L ankle - MSSA  . DEGENERATIVE DISC DISEASE   . HYPERTENSION   . HYPERLIPIDEMIA   . DIABETES MELLITUS, TYPE II, ON INSULIN, CONTROLLED   . LOW BACK PAIN   . PRURITUS 2011  . RENAL CELL CANCER 01/25/2005    s/p partial nephrectomy  . SHOULDER PAIN, RIGHT, CHRONIC     Past Surgical History  Procedure Laterality Date  . Laparoscopic partial nephrectomy  01/25/05    Dr. Risa Grill  . Back surgery  04/2005    Dr. Annette Stable  . Repair ankle ligament  04/2009    disatal fib/infx    Family History  Problem Relation Age of Onset  . Asthma Father     History  Substance Use Topics  . Smoking status: Former Smoker    Quit date: 05/31/1994  . Smokeless tobacco: Not on file     Comment: Married, lives with wife. Former Teacher, English as a foreign language  . Alcohol Use: Yes     Comment: occasionally    Current Outpatient Prescriptions on File Prior to Visit  Medication Sig Dispense Refill  . amLODipine (NORVASC) 10 MG tablet Take 1 tablet (10 mg total) by mouth daily.  90 tablet  1  . aspirin 81 MG tablet Take 81 mg by  mouth daily.        Marland Kitchen atorvastatin (LIPITOR) 40 MG tablet Take 20 mg by mouth daily.      . BD PEN NEEDLE NANO U/F 32G X 4 MM MISC use as directed  100 each  1  . insulin lispro protamine-insulin lispro (HUMALOG 75/25) (75-25) 100 UNIT/ML SUSP Inject 35 Units into the skin 2 (two) times daily with a meal.      . metFORMIN (GLUCOPHAGE) 1000 MG tablet Take 1,000 mg by mouth 2 (two) times daily with a meal.        No current facility-administered medications on file prior to visit.    Allergies: No Known Allergies  Review of Systems  Constitutional: Positive for fatigue. Negative for fever, chills and malaise/fatigue.  Respiratory: Negative for cough, shortness of breath and wheezing.   Cardiovascular: Negative for chest pain.  Gastrointestinal: Positive for diarrhea. Negative for heartburn, nausea, vomiting, abdominal pain, constipation, blood in stool, melena, bloating and anorexia.  Genitourinary: Negative for dysuria.  Musculoskeletal: Negative for arthralgias, back pain and myalgias.  Neurological: Negative for dizziness, tingling, sensory change and speech change.  Psychiatric/Behavioral: Negative for depression and suicidal ideas. The patient is not nervous/anxious and does not have insomnia.        Objective  Filed Vitals:  07/04/13 1054  BP: 120/72  Pulse: 90  Temp: 98.7 F (37.1 C)  TempSrc: Oral  Weight: 178 lb 9.6 oz (81.012 kg)  SpO2: 98%    Physical Exam  Constitutional: He is oriented to person, place, and time. He appears well-developed and well-nourished. No distress.  HENT:  Head: Normocephalic and atraumatic.  Eyes: Conjunctivae are normal. Pupils are equal, round, and reactive to light.  Neck: No JVD present.  Cardiovascular: Normal rate, regular rhythm and normal heart sounds.  Exam reveals no gallop and no friction rub.   No murmur heard. Respiratory: Effort normal and breath sounds normal. No stridor. No respiratory distress.  GI: Soft. Bowel sounds  are normal. He exhibits no distension. There is no tenderness. There is no rebound and no guarding.  Lymphadenopathy:    He has no cervical adenopathy.  Neurological: He is alert and oriented to person, place, and time. No cranial nerve deficit.  Skin: Skin is warm and dry. He is not diaphoretic.  Psychiatric: He has a normal mood and affect. His speech is normal and behavior is normal. Judgment and thought content normal.    BP Readings from Last 3 Encounters:  07/04/13 120/72  03/06/12 132/78  02/02/12 130/62    Wt Readings from Last 3 Encounters:  07/04/13 178 lb 9.6 oz (81.012 kg)  02/02/12 177 lb (80.287 kg)  09/11/10 176 lb 12.8 oz (80.196 kg)    Lab Results  Component Value Date   WBC 7.1 03/06/2012   HGB 12.2* 03/06/2012   HCT 38.1* 03/06/2012   PLT 314 03/06/2012   GLUCOSE 348* 03/06/2012   CHOL 229* 02/02/2012   TRIG 161.0* 02/02/2012   HDL 65.20 02/02/2012   LDLDIRECT 143.2 02/02/2012   LDLCALC 111* 09/11/2010   ALT 9 02/02/2012   AST 11 02/02/2012   NA 137 03/06/2012   K 4.1 03/06/2012   CL 99 03/06/2012   CREATININE 1.07 03/06/2012   BUN 19 03/06/2012   CO2 29 03/06/2012   TSH 2.20 05/29/2009   HGBA1C 11.2* 02/02/2012      Assessment and Plan  Diarrhea -  Patient took OTC laxative Saturday.  He had a strong response to this medication.  Patient could not identify the Laxative.  Patient has been able to Rehydralyte using Gatorade.  Reccommended Pedialyte instead due to his diabetes.   No pharmacologic treatment necessary as symptoms have improved. Patient requested a refill on his humalog. He has an appointment with the [facility] next Thursday for his yearly exam.   Diabetes - no labs since 09/04/130-  Will order labs today (BMP, CBC, A1c, LFT, Lipid, TSH) Will adjust if needed.    Patient was instructed to notify the office if any new symptoms emerged or the current symptoms become worse.  Return in about 6 months (around 01/01/2014).  Donna Christen    I have  personally reviewed this case with PA student. I also personally examined this patient. I agree with history and findings as documented above. I reviewed, discussed and approve of the assessment and plan as listed above. Gwendolyn Grant, MD

## 2013-07-04 NOTE — Patient Instructions (Addendum)
It was good to see you today.  We have reviewed your prior records including labs and tests today  Test(s) ordered today. Your results will be released to [place] (or called to you) after review, usually within 72hours after test completion. If any changes need to be made, you will be notified at that same time.  Medications reviewed and updated, no changes recommended at this time. Refill on medication(s) as discussed today.  Use Imodium as needed for diarrhea symptoms, call if worse or unimproved in next 2 weeks  Please schedule followup in 6 months, call sooner if problems.  Diarrhea Diarrhea is watery poop (stool). It can make you feel weak, tired, thirsty, or give you a dry mouth (signs of dehydration). Watery poop is a sign of another problem, most often an infection. It often lasts 2 3 days. It can last longer if it is a sign of something serious. Take care of yourself as told by your doctor. HOME CARE   Drink 1 cup (8 ounces) of fluid each time you have watery poop.  Do not drink the following fluids:  Those that contain simple sugars (fructose, glucose, galactose, lactose, sucrose, maltose).  Sports drinks.  Fruit juices.  Whole milk products.  Sodas.  Drinks with caffeine (coffee, tea, soda) or alcohol.  Oral rehydration solution may be used if the doctor says it is okay. You may make your own solution. Follow this recipe:    teaspoon table salt.   teaspoon baking soda.   teaspoon salt substitute containing potassium chloride.  1 tablespoons sugar.  1 liter (34 ounces) of water.  Avoid the following foods:  High fiber foods, such as raw fruits and vegetables.  Nuts, seeds, and whole grain breads and cereals.   Those that are sweetened with sugar alcohols (xylitol, sorbitol, mannitol).  Try eating the following foods:  Starchy foods, such as rice, toast, pasta, low-sugar cereal, oatmeal, baked potatoes, crackers, and  bagels.  Bananas.  Applesauce.  Eat probiotic-rich foods, such as yogurt and milk products that are fermented.  Wash your hands well after each time you have watery poop.  Only take medicine as told by your doctor.  Take a warm bath to help lessen burning or pain from having watery poop. GET HELP RIGHT AWAY IF:   You cannot drink fluids without throwing up (vomiting).  You keep throwing up.  You have blood in your poop, or your poop looks black and tarry.  You do not pee (urinate) in 6 8 hours, or there is only a small amount of very dark pee.  You have belly (abdominal) pain that gets worse or stays in the same spot (localizes).  You are weak, dizzy, confused, or lightheaded.  You have a very bad headache.  Your watery poop gets worse or does not get better.  You have a fever or lasting symptoms for more than 2 3 days.  You have a fever and your symptoms suddenly get worse. MAKE SURE YOU:   Understand these instructions.  Will watch your condition.  Will get help right away if you are not doing well or get worse. Document Released: 11/03/2007 Document Revised: 02/09/2012 Document Reviewed: 01/23/2012 [facility] Patient Information 2014 [place], [place]. Diabetes and Standards of Medical Care  Diabetes is complicated. You may find that your diabetes team includes a dietitian, nurse, diabetes educator, eye doctor, and more. To help everyone know what is going on and to help you get the care you deserve, the following schedule of  care was developed to help keep you on track. Below are the tests, exams, vaccines, medicines, education, and plans you will need. HbA1c test This test shows how well you have controlled your glucose over the past 2 3 months. It is used to see if your diabetes management plan needs to be adjusted.   It is performed at least 2 times a year if you are meeting treatment goals.  It is performed 4 times a year if therapy has changed or if you are not  meeting treatment goals. Blood pressure test  This test is performed at every routine medical visit. The goal is less than 140/90 mmHg for most people, but 130/80 mmHg in some cases. Ask your health care provider about your goal. Dental exam  Follow up with the dentist regularly. Eye exam  If you are diagnosed with type 1 diabetes as a child, get an exam upon reaching the age of 14 years or older and have had diabetes for 3 5 years. Yearly eye exams are recommended after that initial eye exam.  If you are diagnosed with type 1 diabetes as an adult, get an exam within 5 years of diagnosis and then yearly.  If you are diagnosed with type 2 diabetes, get an exam as soon as possible after the diagnosis and then yearly. Foot care exam  Visual foot exams are performed at every routine medical visit. The exams check for cuts, injuries, or other problems with the feet.  A comprehensive foot exam should be done yearly. This includes visual inspection as well as assessing foot pulses and testing for loss of sensation.  Check your feet nightly for cuts, injuries, or other problems with your feet. Tell your health care provider if anything is not healing. Kidney function test (urine microalbumin)  This test is performed once a year.  Type 1 diabetes: The first test is performed 5 years after diagnosis.  Type 2 diabetes: The first test is performed at the time of diagnosis.  A serum creatinine and estimated glomerular filtration rate (eGFR) test is done once a year to assess the level of chronic kidney disease (CKD), if present. Lipid profile (cholesterol, HDL, LDL, triglycerides)  Performed every 5 years for most people.  The goal for LDL is less than 100 mg/dL. If you are at high risk, the goal is less than 70 mg/dL.  The goal for HDL is 40 mg/dL 50 mg/dL for men and 50 mg/dL 60 mg/dL for women. An HDL cholesterol of 60 mg/dL or higher gives some protection against heart disease.  The goal  for triglycerides is less than 150 mg/dL. Influenza vaccine, pneumococcal vaccine, and hepatitis B vaccine  The influenza vaccine is recommended yearly.  The pneumococcal vaccine is generally given once in a lifetime. However, there are some instances when another vaccination is recommended. Check with your health care provider.  The hepatitis B vaccine is also recommended for adults with diabetes. Diabetes self-management education  Education is recommended at diagnosis and ongoing as needed. Treatment plan  Your treatment plan is reviewed at every medical visit. Document Released: 03/14/2009 Document Revised: 01/17/2013 Document Reviewed: 10/17/2012 [facility] Patient Information 2014 [place].

## 2013-07-04 NOTE — Progress Notes (Signed)
Pre-visit discussion using our clinic review tool. No additional management support is needed unless otherwise documented below in the visit note.  

## 2013-07-04 NOTE — Assessment & Plan Note (Signed)
Complicated by [facility] - [facility] Plans to now follow for diabetes mellitus here - (but not seen >34mo!) changed Lantus to humalog 75/25 mix bid at pt preference 08/2010 - takes in addition to metformin check lab now and follow up 3 mo, sooner if problems or cbg>200 - pt agrees Also on statin, ARB, ASA 81 Lab Results  Component Value Date   HGBA1C 11.2* 02/02/2012

## 2013-07-04 NOTE — Assessment & Plan Note (Signed)
Off ACEI due to ?swelling rxn -  added ARB 08/2010 due to DM  continue with hctz for BP control as before BP Readings from Last 3 Encounters:  07/04/13 120/72  03/06/12 132/78  02/02/12 130/62

## 2013-07-04 NOTE — Progress Notes (Signed)
Subjective:    Patient ID: Max [name], male    DOB: [birth year], 68 y.o.   MRN: [id number]  GI Problem The primary symptoms include diarrhea. Primary symptoms do not include fever.   also reviewed chronic medical issues: last OV 2013 - usually follows at [facility]  DM2 - on 70/30 but takes only qAM not BID - prefers humalog pen in place of Lantus; also still taking metformin - no hypoglycemia symptoms or sugars >200s  HTN - reports compliance with ongoing medical treatment; medications rx'd by [facility] denies adverse side effects related to current therapy.   dyslipidemia - on statin for years - no GI or muscle symptoms   hx left ankle abscess and deep joint infection s/p I& D  07/2009 with MSSA bacteremia- s/p PICC IV and oral antibiotics course - all symptoms resolved - ankle "normal again" - no swelling, no pain, no fever or redness   skin condition - ? psoriasis [facility]- also dr Ludwig Lean ; taking hydroxizine and actitretin - some improved; still itchy dry skin, especially worse after hot shower/bath; Current dx = ezcema - improved with different steroid lotions and tanning booth  Hx weight loss, unintentional in 2010- resolved since fall 2011; since 1st OV here 01/06/09, lost 30# without change in diet or inc in physical activity; colo 02/2009 - no malig; CT C/A/P 06/05/2009 neg; BM bx 06/13/2009 nonsp/neg  RCC hx 12/2004 partial nephrectomy treatment for same- "cancer free" and no longer following with uro  Past Medical History  Diagnosis Date  . Eosinophilia   . ANXIETY, SITUATIONAL   . DERMATITIS   . SEPTIC ARTHRITIS 07/2009    L ankle - MSSA  . DEGENERATIVE DISC DISEASE   . HYPERTENSION   . HYPERLIPIDEMIA   . DIABETES MELLITUS, TYPE II, ON INSULIN, CONTROLLED   . LOW BACK PAIN   . PRURITUS 2011  . RENAL CELL CANCER 01/25/2005    s/p partial nephrectomy  . SHOULDER PAIN, RIGHT, CHRONIC     Review of Systems  Constitutional: Negative for fever.  Respiratory: Negative  for shortness of breath.   Cardiovascular: Negative for chest pain and leg swelling.  Gastrointestinal: Positive for diarrhea.  Neurological: Negative for weakness and headaches.      Objective:   Physical Exam BP 120/72  Pulse 90  Temp(Src) 98.7 F (37.1 C) (Oral)  Wt 178 lb 9.6 oz (81.012 kg)  SpO2 98% Wt Readings from Last 3 Encounters:  07/04/13 178 lb 9.6 oz (81.012 kg)  02/02/12 177 lb (80.287 kg)  09/11/10 176 lb 12.8 oz (80.196 kg)  Constitutional:  He appears well-developed and well-nourished. No distress. Wife at side Neck: Normal range of motion. Neck supple. No JVD present. No thyromegaly present.  Cardiovascular: Normal rate, regular rhythm and normal heart sounds.  No murmur heard. no BLE edema Pulmonary/Chest: Effort normal and breath sounds normal. No respiratory distress. no wheezes.  Abdominal: Soft. Bowel sounds are normal. Patient exhibits no distension. There is no tenderness.  Skin - diffusely thickened skin, esp extremities - cracking on hands - no erythema or ulcers or hives. Psychiatric: he has a normal mood and affect. behavior is normal. Judgment and thought content normal.   Lab Results  Component Value Date   WBC 7.1 03/06/2012   HGB 12.2* 03/06/2012   HCT 38.1* 03/06/2012   PLT 314 03/06/2012   CHOL 229* 02/02/2012   TRIG 161.0* 02/02/2012   HDL 65.20 02/02/2012   LDLDIRECT 143.2 02/02/2012  ALT 9 02/02/2012   AST 11 02/02/2012   NA 137 03/06/2012   K 4.1 03/06/2012   CL 99 03/06/2012   CREATININE 1.07 03/06/2012   BUN 19 03/06/2012   CO2 29 03/06/2012   TSH 2.20 05/29/2009   HGBA1C 11.2* 02/02/2012       Assessment & Plan:   Diarrhea - likely laxative induced, improved - No fever or red flags on exam- check screening labs and continue Immodium otc prn  Also See problem list. Medications and labs reviewed today.

## 2013-07-05 LAB — TSH: TSH: 1.75 u[IU]/mL (ref 0.35–5.50)

## 2013-12-13 ENCOUNTER — Telehealth: Payer: Self-pay

## 2013-12-13 NOTE — Telephone Encounter (Signed)
LVM for pt to call and schedule CPE with PCP

## 2014-01-10 ENCOUNTER — Telehealth: Payer: Self-pay | Admitting: Internal Medicine

## 2014-01-10 NOTE — Telephone Encounter (Signed)
Left messages for patient to call back to schedule CPE.  Could not find last CPE in system.

## 2014-03-13 ENCOUNTER — Telehealth: Payer: Self-pay

## 2014-03-13 NOTE — Telephone Encounter (Signed)
Pt stated that he had his eye exam 3 months ago at the [facility].

## 2014-05-22 ENCOUNTER — Ambulatory Visit (INDEPENDENT_AMBULATORY_CARE_PROVIDER_SITE_OTHER): Payer: Medicare Other | Admitting: Family

## 2014-05-22 ENCOUNTER — Encounter: Payer: Self-pay | Admitting: Family

## 2014-05-22 VITALS — BP 150/80 | HR 77 | Temp 98.5°F | Resp 18 | Ht 67.0 in | Wt 185.6 lb

## 2014-05-22 DIAGNOSIS — M7021 Olecranon bursitis, right elbow: Secondary | ICD-10-CM

## 2014-05-22 NOTE — Assessment & Plan Note (Signed)
Symptoms and exam consistent with olecranon bursitis. With no warmth or signs of inflammation treat conservatively at this time. Ice and heat as needed. Continue range of motion and protect area from further irritation. Follow up if symptoms worsen or fail to improve.

## 2014-05-22 NOTE — Progress Notes (Signed)
   Subjective:    Patient ID: Max [name], male    DOB: [birth year], 68 y.o.   MRN: [id number]  Chief Complaint  Patient presents with  . Arm Swelling    Right elbow has a place on it that does not hurt or itch, noticed it yesterday morning    HPI:  Max [name] is a 68 y.o. male who presents today for an acute visit.   Acute symptoms of a knot on his right elbow appeared yesterday.  Indicates that it does not hurt or itch. Tends to sleep on his right side and may have leaned it against a bed post. Unsure of any trauma to the area. Denies any medication or attempt to treatment. Denies anything that makes it worse. Indicates that the knot has slowly decreased since yesterday.  No Known Allergies  Current Outpatient Prescriptions on File Prior to Visit  Medication Sig Dispense Refill  . amLODipine (NORVASC) 10 MG tablet Take 1 tablet (10 mg total) by mouth daily. 90 tablet 1  . aspirin 81 MG tablet Take 81 mg by mouth daily.      . BD PEN NEEDLE NANO U/F 32G X 4 MM MISC use as directed 100 each 1  . cetirizine (ZYRTEC) 10 MG tablet Take 10 mg by mouth daily.    . diphenhydrAMINE (SOMINEX) 25 MG tablet Take 25 mg by mouth at bedtime as needed for sleep.    . insulin lispro protamine-lispro (HUMALOG 75/25 MIX) (75-25) 100 UNIT/ML SUSP injection Inject 35 Units into the skin 2 (two) times daily with a meal. 10 mL 1  . lisinopril-hydrochlorothiazide (PRINZIDE,ZESTORETIC) 10-12.5 MG per tablet Take 1 tablet by mouth daily.    . metFORMIN (GLUCOPHAGE) 1000 MG tablet Take 1,000 mg by mouth 2 (two) times daily with a meal.      No current facility-administered medications on file prior to visit.    Review of Systems    See HPI  Objective:    BP 150/80 mmHg  Pulse 77  Temp(Src) 98.5 F (36.9 C) (Oral)  Resp 18  Ht 5\' 7"  (1.702 m)  Wt 185 lb 9.6 oz (84.188 kg)  BMI 29.06 kg/m2  SpO2 96% Nursing note and vital signs reviewed.  Physical Exam  Constitutional: He is oriented to  person, place, and time. He appears well-developed and well-nourished. No distress.  Cardiovascular: Normal rate, regular rhythm, normal heart sounds and intact distal pulses.   Pulmonary/Chest: Effort normal and breath sounds normal.  Musculoskeletal:  No obvious discoloration of right elbow noted. Obvious deformity and edema of right elbow noted. No palpable tenderness noted. Mass is located in an area consistent with the olecranon bursa. Patient exhibits full range of motion and 5 out of 5 strength.  Neurological: He is alert and oriented to person, place, and time.  Skin: Skin is warm and dry.  Psychiatric: He has a normal mood and affect. His behavior is normal. Judgment and thought content normal.       Assessment & Plan:

## 2014-05-22 NOTE — Progress Notes (Signed)
Pre visit review using our clinic review tool, if applicable. No additional management support is needed unless otherwise documented below in the visit note. 

## 2014-05-22 NOTE — Patient Instructions (Signed)
Olecranon Bursitis Bursitis is swelling and soreness (inflammation) of a fluid-filled sac (bursa) that covers and protects a joint. Olecranon bursitis occurs over the elbow.  CAUSES Bursitis can be caused by injury, overuse of the joint, arthritis, or infection.  SYMPTOMS   Tenderness, swelling, warmth, or redness over the elbow.  Elbow pain with movement. This is greater with bending the elbow.  Squeaking sound when the bursa is rubbed or moved.  Increasing size of the bursa without pain or discomfort.  Fever with increasing pain and swelling if the bursa becomes infected. HOME CARE INSTRUCTIONS   Put ice on the affected area.  Put ice in a plastic bag.  Place a towel between your skin and the bag.  Leave the ice on for 15-20 minutes each hour while awake. Do this for the first 2 days.  When resting, elevate your elbow above the level of your heart. This helps reduce swelling.  Continue to put the joint through a full range of motion 4 times per day. Rest the injured joint at other times. When the pain lessens, begin normal slow movements and usual activities.  Only take over-the-counter or prescription medicines for pain, discomfort, or fever as directed by your caregiver.  Reduce your intake of milk and related dairy products (cheese, yogurt). They may make your condition worse. SEEK IMMEDIATE MEDICAL CARE IF:   Your pain increases even during treatment.  You have a fever.  You have heat and inflammation over the bursa and elbow.  You have a red line that goes up your arm.  You have pain with movement of your elbow. MAKE SURE YOU:   Understand these instructions.  Will watch your condition.  Will get help right away if you are not doing well or get worse. Document Released: 06/16/2006 Document Revised: 08/09/2011 Document Reviewed: 05/02/2007 [employer] Patient Information 2015 [employer]. This information is not intended to replace advice given to you by your  health care provider. Make sure you discuss any questions you have with your health care provider.  

## 2014-08-09 ENCOUNTER — Telehealth: Payer: Self-pay

## 2014-08-12 NOTE — Telephone Encounter (Signed)
LVM for patient to call the practice for confirmation of flu shot or can call the office for an apt to receive a flu vaccine before 08/29/2014.

## 2014-11-12 ENCOUNTER — Telehealth: Payer: Self-pay

## 2014-11-12 NOTE — Telephone Encounter (Signed)
Pt did not want to make an appt

## 2014-12-27 ENCOUNTER — Encounter: Payer: Self-pay | Admitting: Gastroenterology

## 2015-04-23 ENCOUNTER — Encounter: Payer: Self-pay | Admitting: Internal Medicine

## 2015-04-23 DIAGNOSIS — Z0289 Encounter for other administrative examinations: Secondary | ICD-10-CM

## 2015-05-03 ENCOUNTER — Encounter: Payer: Self-pay | Admitting: Internal Medicine

## 2015-05-03 ENCOUNTER — Ambulatory Visit (INDEPENDENT_AMBULATORY_CARE_PROVIDER_SITE_OTHER): Payer: Medicare Other | Admitting: Internal Medicine

## 2015-05-03 VITALS — BP 130/80 | HR 81 | Temp 98.4°F | Ht 67.0 in | Wt 182.0 lb

## 2015-05-03 DIAGNOSIS — H01003 Unspecified blepharitis right eye, unspecified eyelid: Secondary | ICD-10-CM | POA: Diagnosis not present

## 2015-05-03 DIAGNOSIS — H01006 Unspecified blepharitis left eye, unspecified eyelid: Secondary | ICD-10-CM

## 2015-05-03 MED ORDER — AZITHROMYCIN 1 % OP SOLN: 1.0000 [drp] | Freq: Two times a day (BID) | OPHTHALMIC | Status: DC

## 2015-05-03 MED ORDER — METHYLPREDNISOLONE 4 MG PO TABS: 4.0000 mg | ORAL_TABLET | Freq: Every day | ORAL | Status: DC

## 2015-05-03 NOTE — Patient Instructions (Signed)
  Your prescription(s) have been submitted to your pharmacy. Please take as directed and contact our office if you believe you are having problem(s) with the medication(s).  Use warm compresses.  Start taking your zyrtec daily.    Call or return if no improvement.

## 2015-05-03 NOTE — Progress Notes (Signed)
Pre visit review using our clinic review tool, if applicable. No additional management support is needed unless otherwise documented below in the visit note. 

## 2015-05-03 NOTE — Progress Notes (Signed)
Subjective:    Patient ID: Max [name], male    DOB: [birth year], 69 y.o.   MRN: [id number]  HPI He is here for an acute visit for eye symptoms and face itching.  He was blowing his leaves the last 3-4 days. Last night his face started to itch and he denies itching throughout his body.  His eye lids are swollen.  He denies swelling elsewhere.  This has occurred in the past and happened last year after blowing leaves.     He put Eucerin on his face yesterday and it helped. He uses this on a regular basis.  He has seen derm in the past.   He denies blurry vision, eye pain and a feeling of something in his eyes.  He denies difficulty swallowing or a sore throat.  He denies fevers.   Medications and allergies reviewed with patient and updated if appropriate.  Patient Active Problem List   Diagnosis Date Noted  . Olecranon bursitis of right elbow 05/22/2014  . SHOULDER PAIN, RIGHT, CHRONIC 06/10/2010  . PRURITUS 08/25/2009  . ANXIETY, SITUATIONAL 08/04/2009  . SEPTIC ARTHRITIS 08/04/2009  . EOSINOPHILIA 05/29/2009  . Type II or unspecified type diabetes mellitus without mention of complication, uncontrolled 01/16/2009  . DERMATITIS 01/16/2009  . HYPERLIPIDEMIA 01/15/2009  . HYPERTENSION 01/15/2009  . DEGENERATIVE DISC DISEASE 01/15/2009  . LOW BACK PAIN 01/15/2009  . RENAL CELL CANCER 01/25/2005    Current Outpatient Prescriptions on File Prior to Visit  Medication Sig Dispense Refill  . amLODipine (NORVASC) 10 MG tablet Take 1 tablet (10 mg total) by mouth daily. 90 tablet 1  . aspirin 81 MG tablet Take 81 mg by mouth daily.      . BD PEN NEEDLE NANO U/F 32G X 4 MM MISC use as directed 100 each 1  . cetirizine (ZYRTEC) 10 MG tablet Take 10 mg by mouth daily.    . diphenhydrAMINE (SOMINEX) 25 MG tablet Take 25 mg by mouth at bedtime as needed for sleep.    . insulin lispro protamine-lispro (HUMALOG 75/25 MIX) (75-25) 100 UNIT/ML SUSP injection Inject 35 Units into the skin 2  (two) times daily with a meal. 10 mL 1  . lisinopril-hydrochlorothiazide (PRINZIDE,ZESTORETIC) 10-12.5 MG per tablet Take 1 tablet by mouth daily.    . metFORMIN (GLUCOPHAGE) 1000 MG tablet Take 1,000 mg by mouth 2 (two) times daily with a meal.      No current facility-administered medications on file prior to visit.    Past Medical History  Diagnosis Date  . Eosinophilia   . ANXIETY, SITUATIONAL   . DERMATITIS   . SEPTIC ARTHRITIS 07/2009    L ankle - [facility]  . DEGENERATIVE DISC DISEASE   . HYPERTENSION   . HYPERLIPIDEMIA   . DIABETES MELLITUS, TYPE II, ON INSULIN, CONTROLLED   . LOW BACK PAIN   . PRURITUS 2011  . RENAL CELL CANCER 01/25/2005    s/p partial nephrectomy  . SHOULDER PAIN, RIGHT, CHRONIC     Past Surgical History  Procedure Laterality Date  . Laparoscopic partial nephrectomy  01/25/05    Dr. Risa Grill  . Back surgery  04/2005    Dr. Annette Stable  . Repair ankle ligament  04/2009    disatal fib/infx    Social History   Social History  . Marital Status: Married    Spouse Name: N/A  . Number of Children: N/A  . Years of Education: N/A   Social History Main Topics  . Smoking  status: Former Smoker    Quit date: 05/31/1994  . Smokeless tobacco: None     Comment: Married, lives with wife. Former Teacher, English as a foreign language  . Alcohol Use: Yes     Comment: occasionally  . Drug Use: No  . Sexual Activity: Not Asked   Other Topics Concern  . None   Social History Narrative    Review of Systems  Constitutional: Negative for fever and chills.  HENT: Positive for sneezing. Negative for congestion, postnasal drip, sinus pressure and sore throat.   Eyes: Negative for photophobia, pain, discharge, redness, itching and visual disturbance.  Respiratory: Positive for cough (dry, mild). Negative for shortness of breath and wheezing.   Cardiovascular: Negative for chest pain and palpitations.  Skin: Negative for rash.       Itching on face  Neurological: Negative for  light-headedness and headaches.       Objective:   Filed Vitals:   05/03/15 1101  BP: 130/80  Pulse: 81  Temp: 98.4 F (36.9 C)   Filed Weights   05/03/15 1101  Weight: 182 lb (82.555 kg)   Body mass index is 28.5 kg/(m^2).   Physical Exam  Constitutional: He appears well-developed and well-nourished. No distress.  HENT:  Head: Normocephalic and atraumatic.  Nose: Nose normal.  Mouth/Throat: Oropharynx is clear and moist. No oropharyngeal exudate.  Eyes: Conjunctivae and EOM are normal. Pupils are equal, round, and reactive to light. Right eye exhibits no discharge. Left eye exhibits no discharge. No scleral icterus.  Significantly swollen upper and lower eye lids bilaterally  Neck: Neck supple. No tracheal deviation present. No thyromegaly present.  Cardiovascular: Normal rate, regular rhythm and normal heart sounds.   No murmur heard. Pulmonary/Chest: Effort normal and breath sounds normal. No stridor. No respiratory distress. He has no wheezes. He has no rales.  Musculoskeletal: He exhibits no edema.  Lymphadenopathy:    He has no cervical adenopathy.  Skin: Skin is warm. No rash noted. He is not diaphoretic.  Mild dryness on face       Assessment & Plan:   Blepharitis, allergic in nature No concerning eye symptoms This has happened in the past - he is unsure how he was treated Not currently taking any allergy medication - restart zyrtec daily Start medrol dose pak given significant eye swelling Start antibiotic eye drops  Call or return if no improvement Discussed that he may need to wear goggles when blowing the leaves or have someone else do it since this is likely the trigger

## 2015-05-14 ENCOUNTER — Encounter: Payer: Self-pay | Admitting: Internal Medicine

## 2015-05-14 ENCOUNTER — Other Ambulatory Visit (INDEPENDENT_AMBULATORY_CARE_PROVIDER_SITE_OTHER): Payer: Medicare Other

## 2015-05-14 ENCOUNTER — Telehealth: Payer: Self-pay | Admitting: *Deleted

## 2015-05-14 ENCOUNTER — Ambulatory Visit (INDEPENDENT_AMBULATORY_CARE_PROVIDER_SITE_OTHER): Payer: Medicare Other | Admitting: Internal Medicine

## 2015-05-14 VITALS — BP 136/60 | HR 85 | Temp 98.5°F | Resp 16 | Wt 180.0 lb

## 2015-05-14 DIAGNOSIS — Z794 Long term (current) use of insulin: Secondary | ICD-10-CM

## 2015-05-14 DIAGNOSIS — E119 Type 2 diabetes mellitus without complications: Secondary | ICD-10-CM

## 2015-05-14 DIAGNOSIS — Z139 Encounter for screening, unspecified: Secondary | ICD-10-CM

## 2015-05-14 DIAGNOSIS — I10 Essential (primary) hypertension: Secondary | ICD-10-CM

## 2015-05-14 LAB — MICROALBUMIN / CREATININE URINE RATIO
Creatinine,U: 144.6 mg/dL
Microalb Creat Ratio: 5.4 mg/g (ref 0.0–30.0)
Microalb, Ur: 7.8 mg/dL — ABNORMAL HIGH (ref 0.0–1.9)

## 2015-05-14 LAB — COMPREHENSIVE METABOLIC PANEL
ALK PHOS: 69 U/L (ref 39–117)
ALT: 10 U/L (ref 0–53)
AST: 12 U/L (ref 0–37)
Albumin: 4.3 g/dL (ref 3.5–5.2)
BUN: 18 mg/dL (ref 6–23)
CO2: 28 mEq/L (ref 19–32)
CREATININE: 1.25 mg/dL (ref 0.40–1.50)
Calcium: 9.5 mg/dL (ref 8.4–10.5)
Chloride: 105 mEq/L (ref 96–112)
GFR: 73.68 mL/min (ref >60.00)
GLUCOSE: 37 mg/dL — AB (ref 70–99)
POTASSIUM: 4.3 meq/L (ref 3.5–5.1)
SODIUM: 141 meq/L (ref 135–145)
TOTAL PROTEIN: 8 g/dL (ref 6.0–8.3)
Total Bilirubin: 0.3 mg/dL (ref 0.2–1.2)

## 2015-05-14 LAB — HEMOGLOBIN A1C: HEMOGLOBIN A1C: 8.4 % — AB (ref 4.6–6.5)

## 2015-05-14 MED ORDER — OXYCODONE-ACETAMINOPHEN 5-325 MG PO TABS: 1.0000 | ORAL_TABLET | Freq: Three times a day (TID) | ORAL | Status: DC | PRN
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
## 2015-05-14 NOTE — Telephone Encounter (Signed)
Received call from labs w/Critical GLUCOSE - 38

## 2015-05-14 NOTE — Patient Instructions (Addendum)
Start taking your insulin twice a day.  Take it just before eating breakfast and dinner - not after.  Eat a mid morning snack.  This may not be an ideal insulin regimen for you - you should discuss medication changes with the [place].   I would recommend going back on a cholesterol lowering medication despite good cholesterol numbers to help prevent heart attacks / strokes.  Blood work today.   A referral has been ordered for a diabetic nutritionist.

## 2015-05-14 NOTE — Assessment & Plan Note (Signed)
Uncontrolled, without complications No recent blood work-has gotten blood work done at the [place], but he is unsure when his last A1c was Stressed that he needs to take the insulin just before eating breakfast and just before eating dinner. This may not be the best insulin regimen for him, but last injection was better. Currently his wife does his injections. He sees someone at the [place] in 2 weeks we will discuss if he needs to change insulin. Given a glucometer. He will start monitoring his sugars at least once a day-take a different times of the day and record. Advised him to bring this record to his appointment at the [facility] A1c, microalbumin, CMP today We'll refer to a diabetic specialist-needs multiple visits for counseling on diet, exercise and diabetic control

## 2015-05-14 NOTE — Telephone Encounter (Signed)
Call him and let him know how low his sugar was just so he is aware.  His visit was regarding his hypoglycemia and we already discussed this.  Thanks.

## 2015-05-14 NOTE — Telephone Encounter (Signed)
Called pt spoke with wife Producer, television/film/video) gave md response. She stated that before they left pt got a little disoriented & they knew his BS had drop. When they got home she fix him a peanut butter sandwich and he ate banana. He is feeling better will check BS to see what it is. Did inform wife to call md if BS drops below 70...Johny Chess

## 2015-05-14 NOTE — Assessment & Plan Note (Signed)
bp well controlled Continue current medication

## 2015-05-14 NOTE — Progress Notes (Signed)
Pre visit review using our clinic review tool, if applicable. No additional management support is needed unless otherwise documented below in the visit note. 

## 2015-05-15 ENCOUNTER — Telehealth: Payer: Self-pay

## 2015-05-15 LAB — HEPATITIS C ANTIBODY: HCV Ab: NEGATIVE

## 2015-05-15 NOTE — Telephone Encounter (Signed)
Call to Mr. Sakurai to introduce AWV and to discuss his diabetes, preventive health and other wellness goals; Stated he would be interested in this but has a doctor's apt this week and 2 next week. Asked if I can call back the 2nd week of January and will come in then. Will plan to call back Jan 9th to schedule apt Tuesday or Wed that weekl

## 2015-05-22 ENCOUNTER — Telehealth: Payer: Self-pay | Admitting: Emergency Medicine

## 2015-05-22 ENCOUNTER — Ambulatory Visit: Payer: Medicare Other | Admitting: *Deleted

## 2015-05-22 NOTE — Telephone Encounter (Signed)
Spoke with pt to inform of lab results. Pt was instructed to work on Diet and Exercise and to monitor blood sugars.

## 2015-06-11 ENCOUNTER — Encounter: Payer: Medicare Other | Attending: Internal Medicine | Admitting: *Deleted

## 2015-06-11 ENCOUNTER — Encounter: Payer: Self-pay | Admitting: *Deleted

## 2015-06-11 VITALS — Ht 66.0 in | Wt 177.4 lb

## 2015-06-11 DIAGNOSIS — E119 Type 2 diabetes mellitus without complications: Secondary | ICD-10-CM | POA: Insufficient documentation

## 2015-06-11 DIAGNOSIS — Z713 Dietary counseling and surveillance: Secondary | ICD-10-CM | POA: Insufficient documentation
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
## 2015-06-11 NOTE — Patient Instructions (Addendum)
Plan:  Aim for 3 Carb Choices per meal (45 grams) +/- 1 either way  Aim for 0-15 Carbs per snack if hungry  Include protein in moderation with your meals and snacks Consider reading food labels for Total Carbohydrate and Fat Grams of foods Consider  increasing your activity level by walking for 20 minutes daily as tolerated Continue checking BG at alternate times per day as directed by MD  Continue taking medication as directed by MD  Consider unsalted peanuts, almonds, When eating spaghetti, only 1/2 plate and add salad

## 2015-07-04 DIAGNOSIS — L308 Other specified dermatitis: Secondary | ICD-10-CM | POA: Diagnosis not present

## 2015-07-21 ENCOUNTER — Ambulatory Visit (INDEPENDENT_AMBULATORY_CARE_PROVIDER_SITE_OTHER): Payer: Medicare Other | Admitting: Internal Medicine

## 2015-07-21 ENCOUNTER — Encounter: Payer: Self-pay | Admitting: Internal Medicine

## 2015-07-21 VITALS — BP 144/72 | HR 94 | Temp 98.4°F | Resp 16 | Wt 178.0 lb

## 2015-07-21 DIAGNOSIS — E119 Type 2 diabetes mellitus without complications: Secondary | ICD-10-CM | POA: Diagnosis not present

## 2015-07-21 DIAGNOSIS — Z794 Long term (current) use of insulin: Secondary | ICD-10-CM

## 2015-07-21 DIAGNOSIS — I10 Essential (primary) hypertension: Secondary | ICD-10-CM | POA: Diagnosis not present

## 2015-07-21 NOTE — Progress Notes (Signed)
Pre visit review using our clinic review tool, if applicable. No additional management support is needed unless otherwise documented below in the visit note. 

## 2015-07-21 NOTE — Assessment & Plan Note (Signed)
Following at the [facility] Less hypoglycemia, but still having some mild hypoglycemia on occasion He is taking his insulin appropriately He has made healthy lifestyle changes He is monitoring more closely Continue current dose of insulin and metformin - stressed close monitoring

## 2015-07-21 NOTE — Progress Notes (Signed)
Subjective:    Patient ID: Max [name], male    DOB: 1945/12/02, 70 y.o.   MRN: [id number]  HPI He is here for follow up for diabetes:  This morning his sugar was 118.  He is checking it 2-3 times a day.  He has a sugar of 57 first thing in the morning and one day it was 69.  He denies any bad episodes of low sugars.  He is eating healthy - salads, baked chicken.  He eats oatmeal, veges, boiled eggs.  He denies lightheadedness, dizziness.  He is walking 1/2 mile  3-4 times a week.   Hypertension: He is taking his medication daily. He is compliant with a low sodium diet.  He denies chest pain, palpitations, edema, shortness of breath and regular headaches. He is exercising regularly.  His blood pressure is typically well controlled with his current medications.     He is seeing his doctor at the [facility] [place] two times a month.  He has seen nutrition.    Medications and allergies reviewed with patient and updated if appropriate.  Patient Active Problem List   Diagnosis Date Noted  . Olecranon bursitis of right elbow 05/22/2014  . SHOULDER PAIN, RIGHT, CHRONIC 06/10/2010  . PRURITUS 08/25/2009  . ANXIETY, SITUATIONAL 08/04/2009  . SEPTIC ARTHRITIS 08/04/2009  . EOSINOPHILIA 05/29/2009  . Diabetes ([place]) 01/16/2009  . DERMATITIS 01/16/2009  . HYPERLIPIDEMIA 01/15/2009  . Essential hypertension 01/15/2009  . DEGENERATIVE DISC DISEASE 01/15/2009  . LOW BACK PAIN 01/15/2009  . RENAL CELL CANCER 01/25/2005    Current Outpatient Prescriptions on File Prior to Visit  Medication Sig Dispense Refill  . amLODipine (NORVASC) 10 MG tablet Take 1 tablet (10 mg total) by mouth daily. 90 tablet 1  . aspirin 81 MG tablet Take 81 mg by mouth daily.      . BD PEN NEEDLE NANO U/F 32G X 4 MM MISC use as directed 100 each 1  . cetirizine (ZYRTEC) 10 MG tablet Take 10 mg by mouth daily. Reported on 06/11/2015    . insulin glargine (LANTUS) 100 UNIT/ML injection Inject 38 Units into the skin daily.    Marland Kitchen  lisinopril-hydrochlorothiazide (PRINZIDE,ZESTORETIC) 10-12.5 MG per tablet Take 1 tablet by mouth daily.    . metFORMIN (GLUCOPHAGE) 1000 MG tablet Take 1,000 mg by mouth 2 (two) times daily with a meal.      No current facility-administered medications on file prior to visit.    Past Medical History  Diagnosis Date  . Eosinophilia   . ANXIETY, SITUATIONAL   . DERMATITIS   . SEPTIC ARTHRITIS 07/2009    L ankle - [facility]  . DEGENERATIVE DISC DISEASE   . HYPERTENSION   . HYPERLIPIDEMIA   . DIABETES MELLITUS, TYPE II, ON INSULIN, CONTROLLED   . LOW BACK PAIN   . PRURITUS 2011  . RENAL CELL CANCER 01/25/2005    s/p partial nephrectomy  . SHOULDER PAIN, RIGHT, CHRONIC     Past Surgical History  Procedure Laterality Date  . Laparoscopic partial nephrectomy  01/25/05    Dr. Risa Grill  . Back surgery  04/2005    Dr. Annette Stable  . Repair ankle ligament  04/2009    disatal fib/infx    Social History   Social History  . Marital Status: Married    Spouse Name: N/A  . Number of Children: N/A  . Years of Education: N/A   Social History Main Topics  . Smoking status: Former Audiological scientist  date: 05/31/1994  . Smokeless tobacco: None     Comment: Married, lives with wife. Former Teacher, English as a foreign language  . Alcohol Use: Yes     Comment: occasionally  . Drug Use: No  . Sexual Activity: Not Asked   Other Topics Concern  . None   Social History Narrative    Family History  Problem Relation Age of Onset  . Asthma Father     Review of Systems  Constitutional: Negative for fever.  Respiratory: Negative for cough, shortness of breath and wheezing.   Cardiovascular: Negative for chest pain, palpitations and leg swelling.  Neurological: Negative for dizziness, light-headedness and headaches.       Objective:   Filed Vitals:   07/21/15 1321  BP: 144/72  Pulse: 94  Temp: 98.4 F (36.9 C)  Resp: 16   Filed Weights   07/21/15 1321  Weight: 178 lb (80.74 kg)   Body mass index is 28.74  kg/(m^2).   Physical Exam Constitutional: Appears well-developed and well-nourished. No distress.  Neck: Neck supple. No tracheal deviation present. No thyromegaly present.  No carotid bruit. No cervical adenopathy.   Cardiovascular: Normal rate, regular rhythm and normal heart sounds.   No murmur heard.  No edema Pulmonary/Chest: Effort normal and breath sounds normal. No respiratory distress. No wheezes.         Assessment & Plan:   See Problem List for Assessment and Plan of chronic medical problems.   Follow up in 6 months since he is followed closely at the [place] -- sooner if needed

## 2015-07-21 NOTE — Assessment & Plan Note (Signed)
BP slightly elevated here today, but typically well controlled Continue current meds Following at the [facility]

## 2015-07-21 NOTE — Patient Instructions (Signed)
   No immunizations administered today.   Medications reviewed and updated.  No changes recommended at this time.   Please schedule followup in 6 months

## 2015-08-01 DIAGNOSIS — L308 Other specified dermatitis: Secondary | ICD-10-CM | POA: Diagnosis not present

## 2016-02-23 DIAGNOSIS — Z85528 Personal history of other malignant neoplasm of kidney: Secondary | ICD-10-CM | POA: Diagnosis not present

## 2016-02-23 DIAGNOSIS — N281 Cyst of kidney, acquired: Secondary | ICD-10-CM | POA: Diagnosis not present

## 2016-06-19 ENCOUNTER — Encounter: Payer: Self-pay | Admitting: Family Medicine

## 2016-06-19 ENCOUNTER — Ambulatory Visit (INDEPENDENT_AMBULATORY_CARE_PROVIDER_SITE_OTHER): Payer: Medicare Other | Admitting: Family Medicine

## 2016-06-19 DIAGNOSIS — M25511 Pain in right shoulder: Secondary | ICD-10-CM | POA: Diagnosis not present

## 2016-06-19 MED ORDER — DICLOFENAC SODIUM 75 MG PO TBEC: 75.0000 mg | DELAYED_RELEASE_TABLET | Freq: Two times a day (BID) | ORAL | 0 refills | Status: DC

## 2016-06-19 NOTE — Patient Instructions (Addendum)
Start home physical therapy.  Start twice daily diclofenac for pain and inflammation.  If not improving in 2 weeks.. Follow up with PCP for possible X-ray and referral for steroid injection likely.   All exercises for 15-30 x 3 , 3 times daily if able. Shoulder Exercises Ask your health care provider which exercises are safe for you. Do exercises exactly as told by your health care provider and adjust them as directed. It is normal to feel mild stretching, pulling, tightness, or discomfort as you do these exercises, but you should stop right away if you feel sudden pain or your pain gets worse.Do not begin these exercises until told by your health care provider. RANGE OF MOTION EXERCISES  These exercises warm up your muscles and joints and improve the movement and flexibility of your shoulder. These exercises also help to relieve pain, numbness, and tingling. These exercises involve stretching your injured shoulder directly. Exercise A: Pendulum  1. Stand near a wall or a surface that you can hold onto for balance. 2. Bend at the waist and let your left / right arm hang straight down. Use your other arm to support you. Keep your back straight and do not lock your knees. 3. Relax your left / right arm and shoulder muscles, and move your hips and your trunk so your left / right arm swings freely. Your arm should swing because of the motion of your body, not because you are using your arm or shoulder muscles. 4. Keep moving your body so your arm swings in the following directions, as told by your health care provider:  Side to side.  Forward and backward.  In clockwise and counterclockwise circles. 5. Continue each motion for ________15__ seconds, or for as long as told by your health care provider. 6. Slowly return to the starting position. Repeat _______3___ times. Complete this exercise ___3_______ times a day. Exercise B:Flexion, Standing  1. Stand and hold a broomstick, a cane, or a  similar object. Place your hands a little more than shoulder-width apart on the object. Your left / right hand should be palm-up, and your other hand should be palm-down. 2. Keep your elbow straight and keep your shoulder muscles relaxed. Push the stick down with your healthy arm to raise your left / right arm in front of your body, and then over your head until you feel a stretch in your shoulder.  Avoid shrugging your shoulder while you raise your arm. Keep your shoulder blade tucked down toward the middle of your back. 3. Hold for ____15-30______ seconds. 4. Slowly return to the starting position. Repeat ____3______ times. Complete this exercise __3________ times a day. Exercise C: Abduction, Standing 1. Stand and hold a broomstick, a cane, or a similar object. Place your hands a little more than shoulder-width apart on the object. Your left / right hand should be palm-up, and your other hand should be palm-down. 2. While keeping your elbow straight and your shoulder muscles relaxed, push the stick across your body toward your left / right side. Raise your left / right arm to the side of your body and then over your head until you feel a stretch in your shoulder.  Do not raise your arm above shoulder height, unless your health care provider tells you to do that.  Avoid shrugging your shoulder while you raise your arm. Keep your shoulder blade tucked down toward the middle of your back. 3. Hold for _____15-30_____ seconds. 4. Slowly return to the starting position.  Repeat ___3_______ times. Complete this exercise ____3______ times a day. Exercise D:Internal Rotation  1. Place your left / right hand behind your back, palm-up. 2. Use your other hand to dangle an exercise band, a towel, or a similar object over your shoulder. Grasp the band with your left / right hand so you are holding onto both ends. 3. Gently pull up on the band until you feel a stretch in the front of your left / right  shoulder.  Avoid shrugging your shoulder while you raise your arm. Keep your shoulder blade tucked down toward the middle of your back. 4. Hold for __________ seconds. 5. Release the stretch by letting go of the band and lowering your hands. Repeat __________ times. Complete this exercise __________ times a day. STRETCHING EXERCISES  These exercises warm up your muscles and joints and improve the movement and flexibility of your shoulder. These exercises also help to relieve pain, numbness, and tingling. These exercises are done using your healthy shoulder to help stretch the muscles of your injured shoulder. Exercise E: Warehouse manager (External Rotation and Abduction)  1. Stand in a doorway with one of your feet slightly in front of the other. This is called a staggered stance. If you cannot reach your forearms to the door frame, stand facing a corner of a room. 2. Choose one of the following positions as told by your health care provider:  Place your hands and forearms on the door frame above your head.  Place your hands and forearms on the door frame at the height of your head.  Place your hands on the door frame at the height of your elbows. 3. Slowly move your weight onto your front foot until you feel a stretch across your chest and in the front of your shoulders. Keep your head and chest upright and keep your abdominal muscles tight. 4. Hold for __________ seconds. 5. To release the stretch, shift your weight to your back foot. Repeat __________ times. Complete this stretch __________ times a day. Exercise F:Extension, Standing 1. Stand and hold a broomstick, a cane, or a similar object behind your back.  Your hands should be a little wider than shoulder-width apart.  Your palms should face away from your back. 2. Keeping your elbows straight and keeping your shoulder muscles relaxed, move the stick away from your body until you feel a stretch in your shoulder.  Avoid shrugging  your shoulders while you move the stick. Keep your shoulder blade tucked down toward the middle of your back. 3. Hold for __________ seconds. 4. Slowly return to the starting position. Repeat __________ times. Complete this exercise __________ times a day. STRENGTHENING EXERCISES  These exercises build strength and endurance in your shoulder. Endurance is the ability to use your muscles for a long time, even after they get tired. Exercise G:External Rotation  1. Sit in a stable chair without armrests. 2. Secure an exercise band at elbow height on your left / right side. 3. Place a soft object, such as a folded towel or a small pillow, between your left / right upper arm and your body to move your elbow a few inches away (about 10 cm) from your side. 4. Hold the end of the band so it is tight and there is no slack. 5. Keeping your elbow pressed against the soft object, move your left / right forearm out, away from your abdomen. Keep your body steady so only your forearm moves. 6. Hold for __________ seconds.  7. Slowly return to the starting position. Repeat __________ times. Complete this exercise __________ times a day. Exercise H:Shoulder Abduction  1. Sit in a stable chair without armrests, or stand. 2. Hold a __________ weight in your left / right hand, or hold an exercise band with both hands. 3. Start with your arms straight down and your left / right palm facing in, toward your body. 4. Slowly lift your left / right hand out to your side. Do not lift your hand above shoulder height unless your health care provider tells you that this is safe.  Keep your arms straight.  Avoid shrugging your shoulder while you do this movement. Keep your shoulder blade tucked down toward the middle of your back. 5. Hold for __________ seconds. 6. Slowly lower your arm, and return to the starting position. Repeat __________ times. Complete this exercise __________ times a day. Exercise I:Shoulder  Extension 1. Sit in a stable chair without armrests, or stand. 2. Secure an exercise band to a stable object in front of you where it is at shoulder height. 3. Hold one end of the exercise band in each hand. Your palms should face each other. 4. Straighten your elbows and lift your hands up to shoulder height. 5. Step back, away from the secured end of the exercise band, until the band is tight and there is no slack. 6. Squeeze your shoulder blades together as you pull your hands down to the sides of your thighs. Stop when your hands are straight down by your sides. Do not let your hands go behind your body. 7. Hold for __________ seconds. 8. Slowly return to the starting position. Repeat __________ times. Complete this exercise __________ times a day. Exercise J:Standing Shoulder Row 1. Sit in a stable chair without armrests, or stand. 2. Secure an exercise band to a stable object in front of you so it is at waist height. 3. Hold one end of the exercise band in each hand. Your palms should be in a thumbs-up position. 4. Bend each of your elbows to an "L" shape (about 90 degrees) and keep your upper arms at your sides. 5. Step back until the band is tight and there is no slack. 6. Slowly pull your elbows back behind you. 7. Hold for __________ seconds. 8. Slowly return to the starting position. Repeat __________ times. Complete this exercise __________ times a day. Exercise K:Shoulder Press-Ups  1. Sit in a stable chair that has armrests. Sit upright, with your feet flat on the floor. 2. Put your hands on the armrests so your elbows are bent and your fingers are pointing forward. Your hands should be about even with the sides of your body. 3. Push down on the armrests and use your arms to lift yourself off of the chair. Straighten your elbows and lift yourself up as much as you comfortably can.  Move your shoulder blades down, and avoid letting your shoulders move up toward your  ears.  Keep your feet on the ground. As you get stronger, your feet should support less of your body weight as you lift yourself up. 4. Hold for __________ seconds. 5. Slowly lower yourself back into the chair. Repeat __________ times. Complete this exercise __________ times a day. Exercise L: Wall Push-Ups  1. Stand so you are facing a stable wall. Your feet should be about one arm-length away from the wall. 2. Lean forward and place your palms on the wall at shoulder height. 3. Keep your feet  flat on the floor as you bend your elbows and lean forward toward the wall. 4. Hold for __________ seconds. 5. Straighten your elbows to push yourself back to the starting position. Repeat __________ times. Complete this exercise __________ times a day. This information is not intended to replace advice given to you by your health care provider. Make sure you discuss any questions you have with your health care provider. Document Released: 03/31/2005 Document Revised: 02/09/2016 Document Reviewed: 01/26/2015 Elsevier Interactive Patient Education  2017 [employer].

## 2016-06-19 NOTE — Progress Notes (Signed)
Pre visit review using our clinic review tool, if applicable. No additional management support is needed unless otherwise documented below in the visit note. 

## 2016-06-19 NOTE — Progress Notes (Signed)
   Subjective:    Patient ID: Max [name], male    DOB: 1945/08/23, 70 y.o.   MRN: [id number]  HPI  71 year old male with history of chronic shoulder pain presents for continued shoulder pain. On 05/27/2016 he went to [facility]  In [place]: could not walk and had pain in right shoulder. Given steroid taper. Given oxycodone for pain.  Helped with symptoms.  he was feeling well until last week had pain in central chest, then spread to bilateral shoulder.. Now only pain in right shoulder. Decrease ROM in right shoulder. Pain with abduction, int rotation and ext rotation. Pain is anterior in shoulder.   No recent fall or injury.  He has been applying OTC pad for pain like icy hot.  Has had off and on pain in shoulders for years... Left injury in 1969, dislocations.  Review of Systems  Constitutional: Negative for fatigue and fever.  HENT: Negative for ear pain.   Eyes: Negative for pain.  Respiratory: Negative for shortness of breath.   Cardiovascular: Negative for palpitations and leg swelling.       Objective:   Physical Exam  Constitutional: Vital signs are normal. He appears well-developed and well-nourished.  HENT:  Head: Normocephalic.  Right Ear: Hearing normal.  Left Ear: Hearing normal.  Nose: Nose normal.  Mouth/Throat: Oropharynx is clear and moist and mucous membranes are normal.  Neck: Trachea normal. Carotid bruit is not present. No thyroid mass and no thyromegaly present.  Cardiovascular: Normal rate, regular rhythm and normal pulses.  Exam reveals no gallop, no distant heart sounds and no friction rub.   No murmur heard. No peripheral edema  Pulmonary/Chest: Effort normal and breath sounds normal. No respiratory distress.  Musculoskeletal:       Right shoulder: He exhibits decreased range of motion, tenderness and bony tenderness. He exhibits no swelling and no effusion.   Mildly positive neerr's,   ttp anterior subacromial space and over acp joint      Skin: Skin is warm, dry and intact. No rash noted.  Psychiatric: He has a normal mood and affect. His speech is normal and behavior is normal. Thought content normal.          Assessment & Plan:

## 2016-06-19 NOTE — Assessment & Plan Note (Addendum)
Appears most consistent with OA of shoulder as well as AC joint , less likely rotator cuff tendonitis.  Encouraged home PT. Start NSAID.  If not improving consider for X-ray and steroid injection.

## 2016-06-29 ENCOUNTER — Telehealth: Payer: Self-pay | Admitting: Internal Medicine

## 2016-06-29 NOTE — Telephone Encounter (Signed)
Called patient to schedule awv. Left msg for patient to call office to schedule appt.  °

## 2016-07-01 ENCOUNTER — Other Ambulatory Visit: Payer: Self-pay | Admitting: *Deleted

## 2016-07-01 ENCOUNTER — Other Ambulatory Visit: Payer: Self-pay | Admitting: Internal Medicine

## 2016-07-01 MED ORDER — DICLOFENAC SODIUM 75 MG PO TBEC: 75.0000 mg | DELAYED_RELEASE_TABLET | Freq: Two times a day (BID) | ORAL | 0 refills | Status: DC

## 2016-07-02 ENCOUNTER — Ambulatory Visit (INDEPENDENT_AMBULATORY_CARE_PROVIDER_SITE_OTHER): Payer: Medicare Other | Admitting: Physician Assistant

## 2016-07-02 ENCOUNTER — Telehealth: Payer: Self-pay

## 2016-07-02 VITALS — BP 142/76 | HR 100 | Temp 99.6°F | Resp 16 | Ht 67.0 in | Wt 168.0 lb

## 2016-07-02 DIAGNOSIS — M25512 Pain in left shoulder: Secondary | ICD-10-CM

## 2016-07-02 DIAGNOSIS — M25511 Pain in right shoulder: Secondary | ICD-10-CM

## 2016-07-02 DIAGNOSIS — M25532 Pain in left wrist: Secondary | ICD-10-CM

## 2016-07-02 MED ORDER — METAXALONE 400 MG PO TABS: 400.0000 mg | ORAL_TABLET | Freq: Three times a day (TID) | ORAL | 0 refills | Status: DC

## 2016-07-02 NOTE — Telephone Encounter (Signed)
skelaxsin not covered, meloxicam or tizanidine is please change and send to pharm if appropriate

## 2016-07-02 NOTE — Patient Instructions (Addendum)
Please start taking the medication from the diclofenac. I am providing you a muscle relaxant.  Please take as prescribed.  It may cause some sedation so be careful with use. I need you to follow up with your pcp, of this shoulder pain.   Ice the shoulder three times per day for 15 minutes.  Ice the wrist the same way as well.  IF you received an x-ray today, you will receive an invoice from [facility]. Please contact [facility] at [phone] with questions or concerns regarding your invoice.   IF you received labwork today, you will receive an invoice from [facility]. Please contact [facility] at [phone] with questions or concerns regarding your invoice.   Our billing staff will not be able to assist you with questions regarding bills from these companies.  You will be contacted with the lab results as soon as they are available. The fastest way to get your results is to activate your My Chart account. Instructions are located on the last page of this paperwork. If you have not heard from [place] regarding the results in 2 weeks, please contact this office.

## 2016-07-02 NOTE — Progress Notes (Signed)
rx called to [facility] , skelaxin per pt request

## 2016-07-02 NOTE — Progress Notes (Signed)
[facility] [address], [place] [address] [phone]  Date:  07/02/2016   Name:  Max [name]   DOB:  08-04-1945   MRN:  [id number]  PCP:  Binnie Rail, MD   Chief Complaint  Patient presents with  . Wrist Pain    x 2 months, Pt. thinks possible arthritis     History of Present Illness:  Max [name] is a 71 y.o. male patient who presents to [facility] for shoulder and wrist pain. Chronic shoulder pain that has progressively worsened pain is generalized to the shoulder without tenderness or numbness.  He has not noticed swelling.  No chest pains, diaphoresis, palpitations, or sob.  Wrist pain of the left side also present.   No swelling or erythema to the shoulder.  No true repetitive movement.  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  This is followed by the [place].     Patient Active Problem List   Diagnosis Date Noted  . Acute pain of right shoulder 06/19/2016  . Olecranon bursitis of right elbow 05/22/2014  . SHOULDER PAIN, RIGHT, CHRONIC 06/10/2010  . PRURITUS 08/25/2009  . ANXIETY, SITUATIONAL 08/04/2009  . SEPTIC ARTHRITIS 08/04/2009  . EOSINOPHILIA 05/29/2009  . Diabetes ([place]) 01/16/2009  . DERMATITIS 01/16/2009  . HYPERLIPIDEMIA 01/15/2009  . Essential hypertension 01/15/2009  . DEGENERATIVE DISC DISEASE 01/15/2009  . LOW BACK PAIN 01/15/2009  . RENAL CELL CANCER 01/25/2005    Past Medical History:  Diagnosis Date  . ANXIETY, SITUATIONAL   . DEGENERATIVE DISC DISEASE   . DERMATITIS   . DIABETES MELLITUS, TYPE II, ON INSULIN, CONTROLLED   . Eosinophilia   . HYPERLIPIDEMIA   . HYPERTENSION   . LOW BACK PAIN   . PRURITUS 2011  . RENAL CELL CANCER 01/25/2005   s/p partial nephrectomy  . SEPTIC ARTHRITIS 07/2009   L ankle - MSSA  . SHOULDER PAIN, RIGHT, CHRONIC     Past Surgical History:  Procedure Laterality Date  . BACK SURGERY  04/2005   Dr. Annette Stable  . LAPAROSCOPIC PARTIAL NEPHRECTOMY  01/25/05   Dr. Risa Grill  .  REPAIR ANKLE LIGAMENT  04/2009   disatal fib/infx    Social History  Substance Use Topics  . Smoking status: Former Smoker    Quit date: 05/31/1994  . Smokeless tobacco: Never Used     Comment: Married, lives with wife. Former Teacher, Max [name] as a foreign language  . Alcohol use Yes     Comment: occasionally    Family History  Problem Relation Age of Onset  . Asthma Father     No Known Allergies  Medication list has been reviewed and updated.  Current Outpatient Prescriptions on File Prior to Visit  Medication Sig Dispense Refill  . amLODipine (NORVASC) 10 MG tablet Take 1 tablet (10 mg total) by mouth daily. 90 tablet 1  . aspirin 81 MG tablet Take 81 mg by mouth daily.      . BD PEN NEEDLE NANO U/F 32G X 4 MM MISC use as directed 100 each 1  . cetirizine (ZYRTEC) 10 MG tablet Take 10 mg by mouth daily. Reported on 06/11/2015    . diclofenac (VOLTAREN) 75 MG EC tablet Take 1 tablet (75 mg total) by mouth 2 (two) times daily. Yearly physical w/labs are due must see MD for refills 30 tablet 0  . insulin glargine (LANTUS) 100 UNIT/ML injection Inject 38 Units into the skin daily.    Marland Kitchen lisinopril-hydrochlorothiazide (PRINZIDE,ZESTORETIC) 10-12.5  MG per tablet Take 1 tablet by mouth daily.    . metFORMIN (GLUCOPHAGE) 1000 MG tablet Take 1,000 mg by mouth 2 (two) times daily with a meal.      No current facility-administered medications on file prior to visit.     ROS ROS otherwise unremarkable unless listed above.  Physical Examination: BP (!) 142/76 (BP Location: Right Arm, Patient Position: Sitting, Cuff Size: Normal)   Pulse 100   Temp 99.6 F (37.6 C) (Oral)   Resp 16   Ht 5\' 7"  (1.702 m)   Wt 168 lb (76.2 kg)   SpO2 98%   BMI 26.31 kg/m  Ideal Body Weight: Weight in (lb) to have BMI = 25: 159.3  Physical Exam  Constitutional: He is oriented to person, place, and time. He appears well-developed and well-nourished. No distress.  HENT:  Head: Normocephalic and atraumatic.  Eyes:  Conjunctivae and EOM are normal. Pupils are equal, round, and reactive to light.  Cardiovascular: Normal rate.   Pulmonary/Chest: Effort normal. No respiratory distress.  Musculoskeletal:       Right shoulder: He exhibits normal range of motion and no tenderness.       Left shoulder: He exhibits pain (with external rotation). He exhibits normal range of motion and no tenderness.       Left wrist: He exhibits tenderness. He exhibits no bony tenderness.  Negative phalen, tinel Negative dequervain  Neurological: He is alert and oriented to person, place, and time.  Skin: Skin is warm and dry. He is not diaphoretic.  Psychiatric: He has a normal mood and affect. His behavior is normal.     Assessment and Plan: Max [name] is a 71 y.o. male who is here today for cc of shoulder and wrist pain. Advised to follow up with pcp regarding shoulder pain Ice three times per day as well as wrist. Given skelaxin and advised to give the diclofenac.  Pain in joint of right shoulder  Left wrist pain - Plan: DISCONTINUED: metaxalone (SKELAXIN) 400 MG tablet  Acute pain of left shoulder - Plan: DISCONTINUED: metaxalone (SKELAXIN) 400 MG tablet  Ivar Drape, PA-C [facility] 3/16/20189:36 AM

## 2016-07-03 ENCOUNTER — Telehealth: Payer: Self-pay

## 2016-07-03 ENCOUNTER — Other Ambulatory Visit: Payer: Self-pay | Admitting: *Deleted

## 2016-07-03 DIAGNOSIS — M25519 Pain in unspecified shoulder: Secondary | ICD-10-CM

## 2016-07-03 MED ORDER — METHOCARBAMOL 500 MG PO TABS: 500.0000 mg | ORAL_TABLET | Freq: Three times a day (TID) | ORAL | 0 refills | Status: AC | PRN

## 2016-07-03 MED ORDER — METHOCARBAMOL 500 MG PO TABS: 500.0000 mg | ORAL_TABLET | Freq: Three times a day (TID) | ORAL | 0 refills | Status: DC | PRN

## 2016-07-03 NOTE — Telephone Encounter (Signed)
See prior message, skelaxin not covered, waiting for change of med to covered

## 2016-07-03 NOTE — Telephone Encounter (Signed)
Pt is checking on the message he left yesterday about his medication not being called in from yesterdays visit

## 2016-07-03 NOTE — Telephone Encounter (Signed)
Pt would like [place] to use [facility] on [address]

## 2016-07-03 NOTE — Telephone Encounter (Signed)
Pended order.  Please send to patient.  [facility], [facility]

## 2016-07-03 NOTE — Addendum Note (Signed)
Addended by: Ivar Drape D on: 07/03/2016 02:52 PM   Modules accepted: Orders

## 2016-07-08 ENCOUNTER — Encounter (HOSPITAL_COMMUNITY): Payer: Self-pay | Admitting: Emergency Medicine

## 2016-07-08 ENCOUNTER — Emergency Department (HOSPITAL_COMMUNITY)
Admission: EM | Admit: 2016-07-08 | Discharge: 2016-07-08 | Disposition: A | Discharge status: Home / Self Care | Payer: Medicare Other | Attending: Emergency Medicine | Admitting: Emergency Medicine

## 2016-07-08 DIAGNOSIS — Z79899 Other long term (current) drug therapy: Secondary | ICD-10-CM | POA: Insufficient documentation

## 2016-07-08 DIAGNOSIS — Z794 Long term (current) use of insulin: Secondary | ICD-10-CM | POA: Insufficient documentation

## 2016-07-08 DIAGNOSIS — Z7982 Long term (current) use of aspirin: Secondary | ICD-10-CM | POA: Insufficient documentation

## 2016-07-08 DIAGNOSIS — G8929 Other chronic pain: Secondary | ICD-10-CM | POA: Diagnosis not present

## 2016-07-08 DIAGNOSIS — M25512 Pain in left shoulder: Secondary | ICD-10-CM | POA: Insufficient documentation

## 2016-07-08 DIAGNOSIS — Z87891 Personal history of nicotine dependence: Secondary | ICD-10-CM | POA: Insufficient documentation

## 2016-07-08 DIAGNOSIS — E119 Type 2 diabetes mellitus without complications: Secondary | ICD-10-CM | POA: Insufficient documentation

## 2016-07-08 DIAGNOSIS — I10 Essential (primary) hypertension: Secondary | ICD-10-CM | POA: Insufficient documentation

## 2016-07-08 MED ORDER — ACETAMINOPHEN 500 MG PO TABS
1000.0000 mg | ORAL_TABLET | Freq: Once | ORAL | Status: AC
  Administered 2016-07-08: 1000 mg via ORAL
  Filled 2016-07-08: qty 2

## 2016-07-08 NOTE — ED Triage Notes (Signed)
Pt sts bilateral shoulder pain and leg pain; pt sts chronic in nature; pt seen at [facility] for same and given oxycodone

## 2016-07-08 NOTE — Discharge Instructions (Signed)
You may use IcyHot or other topical creams for arthritis in addition to the medicine you are taking. Follow up with your PCP as this will require long term management by a primary care provider.

## 2016-07-08 NOTE — ED Notes (Signed)
NAD at this time. Pt is stable and going home.  

## 2016-07-08 NOTE — ED Provider Notes (Signed)
[facility] DEPT Provider Note   CSN: [id number] Arrival date & time: 07/08/16  1045     History   Chief Complaint Chief Complaint  Patient presents with  . Shoulder Pain  . Leg Pain    HPI Max [name] is a 71 y.o. male.  HPI 71 year old male with a history of diabetes, hyperlipidemia, hypertension, chronic low back pain and chronic shoulder pain presenting with left shoulder pain. He states that since December he has had single left shoulder pain. 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 He was seen at the [place] in December and given oxycodone and had x-rays performed that were negative for fracture or dislocation. He has not had any recent falls or traumas. Denies any numbness or weakness. He states that he was also seen at urgent care on Saturday and given Robaxin and diclofenac. His shoulder still hurts therefore he wanted to be reevaluated. Denies chest pain, headache, shortness of breath, abdominal pain, fevers.  Past Medical History:  Diagnosis Date  . ANXIETY, SITUATIONAL   . DEGENERATIVE DISC DISEASE   . DERMATITIS   . DIABETES MELLITUS, TYPE II, ON INSULIN, CONTROLLED   . Eosinophilia   . HYPERLIPIDEMIA   . HYPERTENSION   . LOW BACK PAIN   . PRURITUS 2011  . RENAL CELL CANCER 01/25/2005   s/p partial nephrectomy  . SEPTIC ARTHRITIS 07/2009   L ankle - [facility]  . SHOULDER PAIN, RIGHT, CHRONIC     Patient Active Problem List   Diagnosis Date Noted  . Acute pain of right shoulder 06/19/2016  . Olecranon bursitis of right elbow 05/22/2014  . SHOULDER PAIN, RIGHT, CHRONIC 06/10/2010  . PRURITUS 08/25/2009  . ANXIETY, SITUATIONAL 08/04/2009  . SEPTIC ARTHRITIS 08/04/2009  . EOSINOPHILIA 05/29/2009  . Diabetes ([place]) 01/16/2009  . DERMATITIS 01/16/2009  . HYPERLIPIDEMIA 01/15/2009  . Essential hypertension 01/15/2009  . DEGENERATIVE DISC DISEASE 01/15/2009  . LOW BACK PAIN 01/15/2009  . RENAL CELL CANCER 01/25/2005    Past Surgical History:  Procedure  Laterality Date  . BACK SURGERY  04/2005   Dr. Annette Stable  . LAPAROSCOPIC PARTIAL NEPHRECTOMY  01/25/05   Dr. Risa Grill  . REPAIR ANKLE LIGAMENT  04/2009   disatal fib/infx       Home Medications    Prior to Admission medications   Medication Sig Start Date End Date Taking? Authorizing Provider  amLODipine (NORVASC) 10 MG tablet Take 1 tablet (10 mg total) by mouth daily. 02/02/12   Rowe Clack, MD  aspirin 81 MG tablet Take 81 mg by mouth daily.      Historical Provider, MD  BD PEN NEEDLE NANO U/F 32G X 4 MM MISC use as directed 02/17/13   Rowe Clack, MD  cetirizine (ZYRTEC) 10 MG tablet Take 10 mg by mouth daily. Reported on 06/11/2015    Historical Provider, MD  diclofenac (VOLTAREN) 75 MG EC tablet Take 1 tablet (75 mg total) by mouth 2 (two) times daily. Yearly physical w/labs are due must see MD for refills 07/01/16   Binnie Rail, MD  insulin glargine (LANTUS) 100 UNIT/ML injection Inject 38 Units into the skin daily.    Historical Provider, MD  lisinopril-hydrochlorothiazide (PRINZIDE,ZESTORETIC) 10-12.5 MG per tablet Take 1 tablet by mouth daily.    Historical Provider, MD  metaxalone (SKELAXIN) 400 MG tablet Take 1 tablet (400 mg total) by mouth 3 (three) times daily. 07/02/16   Dorian Heckle English, PA  metFORMIN (GLUCOPHAGE) 1000 MG tablet Take 1,000 mg  by mouth 2 (two) times daily with a meal.  12/09/10   Biagio Borg, MD  methocarbamol (ROBAXIN) 500 MG tablet Take 1 tablet (500 mg total) by mouth 3 (three) times daily as needed for muscle spasms. 07/03/16 07/08/16  Joretta Bachelor, PA    Family History Family History  Problem Relation Age of Onset  . Asthma Father     Social History Social History  Substance Use Topics  . Smoking status: Former Smoker    Quit date: 05/31/1994  . Smokeless tobacco: Never Used     Comment: Married, lives with wife. Former Teacher, English as a foreign language  . Alcohol use Yes     Comment: occasionally     Allergies   Patient has no known  allergies.   Review of Systems Review of Systems  Constitutional: Negative for chills and fever.  HENT: Negative for ear pain and sore throat.   Eyes: Negative for pain and visual disturbance.  Respiratory: Negative for cough and shortness of breath.   Cardiovascular: Negative for chest pain and palpitations.  Gastrointestinal: Negative for abdominal pain and vomiting.  Genitourinary: Negative for dysuria and hematuria.  Musculoskeletal: Negative for arthralgias, back pain and neck pain.  Skin: Negative for color change and rash.  Neurological: Negative for seizures and syncope.  All other systems reviewed and are negative.    Physical Exam Updated Vital Signs BP 122/64 (BP Location: Right Arm)   Pulse 78   Temp 98.1 F (36.7 C) (Oral)   Resp 14   SpO2 100%   Physical Exam  Constitutional: He is oriented to person, place, and time. He appears well-developed and well-nourished.  HENT:  Head: Normocephalic and atraumatic.  Eyes: Conjunctivae are normal.  Neck: Neck supple.  Cardiovascular: Normal rate and regular rhythm.   No murmur heard. Pulmonary/Chest: Effort normal and breath sounds normal. No respiratory distress.  Abdominal: Soft. There is no tenderness.  Musculoskeletal: He exhibits no edema.       Left shoulder: He exhibits decreased range of motion, tenderness and pain. He exhibits no bony tenderness and no deformity.  2+ BL radial pulses.   Neurological: He is alert and oriented to person, place, and time. He has normal strength. No cranial nerve deficit or sensory deficit.  LUE NVI.  Skin: Skin is warm and dry.  Psychiatric: He has a normal mood and affect.  Nursing note and vitals reviewed.    ED Treatments / Results  Labs (all labs ordered are listed, but only abnormal results are displayed) Labs Reviewed - No data to display  EKG  EKG Interpretation None       Radiology No results found.  Procedures Procedures (including critical care  time)  Medications Ordered in ED Medications  acetaminophen (TYLENOL) tablet 1,000 mg (1,000 mg Oral Given 07/08/16 1223)     Initial Impression / Assessment and Plan / ED Course  I have reviewed the triage vital signs and the nursing notes.  Pertinent labs & imaging results that were available during my care of the patient were reviewed by me and considered in my medical decision making (see chart for details).    71 year old male presenting with chronic shoulder pain. He states he has had recent x-rays showed no fracture or dislocation. He was given oxycodone from the [place] and Robaxin and diclofenac at the urgent care on Saturday. On exam he has decreased range of motion however no point bony tenderness. Shoulder appears located. Left upper extremity neurovascularly intact. Pain likely from chronic  arthritis. As he is already on the medication that he needs to be on I informed him that this is a chronic issue and needs to be managed by his primary care provider. as he wanted something for pain and today he was given Tylenol. He was amenable to this plan and discharged in good condition. Strict return precautions given.  Patient care discussed and supervised by my attending, Dr. Leonette Monarch. Drucie Ip, MD   Final Clinical Impressions(s) / ED Diagnoses   Final diagnoses:  Chronic left shoulder pain    New Prescriptions Discharge Medication List as of 07/08/2016 11:55 AM       Khloee Garza Mali Meaghann Choo, MD 07/08/16 [phone]

## 2016-07-08 NOTE — ED Provider Notes (Signed)
I have personally seen and examined the patient. I have reviewed the documentation on PMH/FH/Soc Hx. I have discussed the plan of care with the resident and patient.  I have reviewed and agree with the resident's documentation. Please see associated encounter note.   EKG Interpretation None         Fatima Blank, MD 07/08/16 1158

## 2016-07-09 ENCOUNTER — Encounter: Payer: Self-pay | Admitting: Nurse Practitioner

## 2016-07-09 ENCOUNTER — Ambulatory Visit (INDEPENDENT_AMBULATORY_CARE_PROVIDER_SITE_OTHER): Payer: Medicare Other | Admitting: Nurse Practitioner

## 2016-07-09 VITALS — BP 156/86 | HR 91 | Temp 98.4°F | Ht 67.0 in | Wt 168.0 lb

## 2016-07-09 DIAGNOSIS — M25511 Pain in right shoulder: Secondary | ICD-10-CM

## 2016-07-09 MED ORDER — KETOROLAC TROMETHAMINE 30 MG/ML IJ SOLN: 30.0000 mg | Freq: Once | INTRAMUSCULAR | Status: AC

## 2016-07-09 MED ORDER — HYDROCODONE-HOMATROPINE 5-1.5 MG/5ML PO SYRP: 5.0000 mL | ORAL_SOLUTION | Freq: Every evening | ORAL | 0 refills | Status: DC | PRN

## 2016-07-09 MED ORDER — DM-GUAIFENESIN ER 30-600 MG PO TB12: 1.0000 | ORAL_TABLET | Freq: Two times a day (BID) | ORAL | 0 refills | Status: DC | PRN

## 2016-07-09 MED ORDER — METHOCARBAMOL 500 MG PO TABS: 500.0000 mg | ORAL_TABLET | Freq: Three times a day (TID) | ORAL | 0 refills | Status: DC | PRN

## 2016-07-09 MED ORDER — OSELTAMIVIR PHOSPHATE 75 MG PO CAPS
75.0000 mg | ORAL_CAPSULE | Freq: Two times a day (BID) | ORAL | 0 refills | Status: DC
Start: 2016-07-09 — End: 2016-07-09

## 2016-07-09 MED ORDER — PREDNISONE 20 MG PO TABS: 40.0000 mg | ORAL_TABLET | Freq: Every day | ORAL | 0 refills | Status: DC

## 2016-07-09 MED ORDER — IPRATROPIUM BROMIDE 0.06 % NA SOLN: 2.0000 | Freq: Two times a day (BID) | NASAL | 0 refills | Status: DC

## 2016-07-09 NOTE — Patient Instructions (Addendum)
Go to basement for repeat x-ray. Schedule appt to follow up with Dr. Tamala Julian.

## 2016-07-09 NOTE — Progress Notes (Signed)
Subjective:  Patient ID: Max [name], male    DOB: 11/01/1945  Age: 71 y.o. MRN: [id number]  CC: hospital follow up (pain in left shoulder and right leg, went to [facility], got blood work done at [place], was given a muscle rrelaxer and has ran out)   Shoulder Pain   The pain is present in the right shoulder. This is a recurrent problem. The current episode started 1 to 4 weeks ago. There has been no history of extremity trauma. The problem occurs constantly. The problem has been unchanged. The quality of the pain is described as aching and burning. The pain is at a severity of 10/10. The pain is severe. Associated symptoms include an inability to bear weight, a limited range of motion and stiffness. Pertinent negatives include no fever, itching, joint locking, joint swelling, numbness or tingling. The symptoms are aggravated by activity and lying down. He has tried rest, heat, NSAIDS and oral narcotics (and muscle relaxant) for the symptoms. Family history does not include gout or rheumatoid arthritis. His past medical history is significant for diabetes and osteoarthritis. There is no history of gout or rheumatoid arthritis.    Outpatient Medications Prior to Visit  Medication Sig Dispense Refill  . amLODipine (NORVASC) 10 MG tablet Take 1 tablet (10 mg total) by mouth daily. 90 tablet 1  . aspirin 81 MG tablet Take 81 mg by mouth daily.      . BD PEN NEEDLE NANO U/F 32G X 4 MM MISC use as directed 100 each 1  . cetirizine (ZYRTEC) 10 MG tablet Take 10 mg by mouth daily. Reported on 06/11/2015    . diclofenac (VOLTAREN) 75 MG EC tablet Take 1 tablet (75 mg total) by mouth 2 (two) times daily. Yearly physical w/labs are due must see MD for refills 30 tablet 0  . insulin glargine (LANTUS) 100 UNIT/ML injection Inject 38 Units into the skin daily.    Marland Kitchen lisinopril-hydrochlorothiazide (PRINZIDE,ZESTORETIC) 10-12.5 MG per tablet Take 1 tablet by mouth daily.    . metFORMIN (GLUCOPHAGE) 1000 MG  tablet Take 1,000 mg by mouth 2 (two) times daily with a meal.     . metaxalone (SKELAXIN) 400 MG tablet Take 1 tablet (400 mg total) by mouth 3 (three) times daily. 30 tablet 0   No facility-administered medications prior to visit.     ROS See HPI  Objective:  BP (!) 156/86 (BP Location: Right Arm, Patient Position: Sitting, Cuff Size: Normal)   Pulse 91   Temp 98.4 F (36.9 C) (Oral)   Ht 5\' 7"  (1.702 m)   Wt 168 lb (76.2 kg)   SpO2 99%   BMI 26.31 kg/m   BP Readings from Last 3 Encounters:  07/09/16 (!) 156/86  07/08/16 122/64  07/02/16 (!) 142/76    Wt Readings from Last 3 Encounters:  07/09/16 168 lb (76.2 kg)  07/02/16 168 lb (76.2 kg)  06/19/16 169 lb 8 oz (76.9 kg)    Physical Exam  Musculoskeletal:       Right shoulder: He exhibits decreased range of motion, tenderness and pain. He exhibits no bony tenderness, no swelling, no effusion, no deformity, no laceration, no spasm, normal pulse and normal strength.       Right elbow: Normal.      Right wrist: Normal.       Cervical back: Normal.       Right upper arm: Normal.       Right forearm: Normal.  Right hand: Normal.    Lab Results  Component Value Date   WBC 5.0 07/04/2013   HGB 13.1 07/04/2013   HCT 39.9 07/04/2013   PLT 227.0 07/04/2013   GLUCOSE 37 (LL) 05/14/2015   CHOL 154 07/04/2013   TRIG 162.0 (H) 07/04/2013   HDL 34.20 (L) 07/04/2013   LDLDIRECT 143.2 02/02/2012   LDLCALC 87 07/04/2013   ALT 10 05/14/2015   AST 12 05/14/2015   NA 141 05/14/2015   K 4.3 05/14/2015   CL 105 05/14/2015   CREATININE 1.25 05/14/2015   BUN 18 05/14/2015   CO2 28 05/14/2015   TSH 1.75 07/04/2013   HGBA1C 8.4 (H) 05/14/2015   MICROALBUR 7.8 (H) 05/14/2015    No results found.  Assessment & Plan:   Tyrian was seen today for hospital follow up.  Diagnoses and all orders for this visit:  Right anterior shoulder pain -     ketorolac (TORADOL) 30 MG/ML injection 30 mg; Inject 1 mL (30 mg total)  into the muscle once. -     Ambulatory referral to Sports Medicine -     predniSONE (DELTASONE) 20 MG tablet; Take 2 tablets (40 mg total) by mouth daily with breakfast. -     DG Shoulder Right; Future -     methocarbamol (ROBAXIN) 500 MG tablet; Take 1 tablet (500 mg total) by mouth every 8 (eight) hours as needed for muscle spasms.  Other orders -     Discontinue: oseltamivir (TAMIFLU) 75 MG capsule; Take 1 capsule (75 mg total) by mouth 2 (two) times daily. -     Discontinue: HYDROcodone-homatropine (HYCODAN) 5-1.5 MG/5ML syrup; Take 5 mLs by mouth at bedtime as needed for cough. -     Discontinue: dextromethorphan-guaiFENesin (MUCINEX DM) 30-600 MG 12hr tablet; Take 1 tablet by mouth 2 (two) times daily as needed for cough. -     Discontinue: ipratropium (ATROVENT) 0.06 % nasal spray; Place 2 sprays into both nostrils 2 (two) times daily.   I have discontinued Mr. Floor's metaxalone, oseltamivir, HYDROcodone-homatropine, dextromethorphan-guaiFENesin, and ipratropium. I am also having him start on predniSONE and methocarbamol. Additionally, I am having him maintain his aspirin, amLODipine, metFORMIN, BD PEN NEEDLE NANO U/F, lisinopril-hydrochlorothiazide, cetirizine, insulin glargine, and diclofenac. We will continue to administer ketorolac.  Meds ordered this encounter  Medications  . DISCONTD: oseltamivir (TAMIFLU) 75 MG capsule    Sig: Take 1 capsule (75 mg total) by mouth 2 (two) times daily.    Dispense:  10 capsule    Refill:  0    Order Specific Question:   Supervising Provider    Answer:   Cassandria Anger [[id number]]  . DISCONTD: HYDROcodone-homatropine (HYCODAN) 5-1.5 MG/5ML syrup    Sig: Take 5 mLs by mouth at bedtime as needed for cough.    Dispense:  120 mL    Refill:  0    Order Specific Question:   Supervising Provider    Answer:   Cassandria Anger [[id number]]  . DISCONTD: dextromethorphan-guaiFENesin (MUCINEX DM) 30-600 MG 12hr tablet    Sig: Take 1 tablet by mouth 2  (two) times daily as needed for cough.    Dispense:  14 tablet    Refill:  0    Order Specific Question:   Supervising Provider    Answer:   Cassandria Anger [[id number]]  . DISCONTD: ipratropium (ATROVENT) 0.06 % nasal spray    Sig: Place 2 sprays into both nostrils 2 (two) times daily.    Dispense:  15 mL    Refill:  0    Order Specific Question:   Supervising Provider    Answer:   Cassandria Anger [[id number]]  . ketorolac (TORADOL) 30 MG/ML injection 30 mg  . predniSONE (DELTASONE) 20 MG tablet    Sig: Take 2 tablets (40 mg total) by mouth daily with breakfast.    Dispense:  10 tablet    Refill:  0    Order Specific Question:   Supervising Provider    Answer:   Cassandria Anger [[id number]]  . methocarbamol (ROBAXIN) 500 MG tablet    Sig: Take 1 tablet (500 mg total) by mouth every 8 (eight) hours as needed for muscle spasms.    Dispense:  21 tablet    Refill:  0    Order Specific Question:   Supervising Provider    Answer:   Cassandria Anger [[id number]]    Follow-up: Return if symptoms worsen or fail to improve.  Wilfred Lacy, NP

## 2016-07-09 NOTE — Progress Notes (Signed)
Pre visit review using our clinic review tool, if applicable. No additional management support is needed unless otherwise documented below in the visit note. 

## 2016-07-17 ENCOUNTER — Ambulatory Visit (INDEPENDENT_AMBULATORY_CARE_PROVIDER_SITE_OTHER): Payer: Medicare Other | Admitting: Adult Health

## 2016-07-17 ENCOUNTER — Encounter: Payer: Self-pay | Admitting: Adult Health

## 2016-07-17 VITALS — BP 140/70 | Temp 98.7°F | Resp 14 | Ht 67.0 in | Wt 168.0 lb

## 2016-07-17 DIAGNOSIS — M1990 Unspecified osteoarthritis, unspecified site: Secondary | ICD-10-CM | POA: Diagnosis not present

## 2016-07-17 MED ORDER — TRAMADOL HCL 50 MG PO TABS: 50.0000 mg | ORAL_TABLET | Freq: Three times a day (TID) | ORAL | 0 refills | Status: AC | PRN

## 2016-07-17 MED ORDER — TRAMADOL HCL 50 MG PO TABS: 50.0000 mg | ORAL_TABLET | Freq: Three times a day (TID) | ORAL | 0 refills | Status: DC | PRN

## 2016-07-17 NOTE — Progress Notes (Signed)
Pre visit review using our clinic review tool, if applicable. No additional management support is needed unless otherwise documented below in the visit note. 

## 2016-07-17 NOTE — Progress Notes (Signed)
Subjective:    Patient ID: Max [name], male    DOB: 09-Jan-1946, 71 y.o.   MRN: [id number]  HPI  71 year old male who  has a past medical history of ANXIETY, SITUATIONAL; DEGENERATIVE DISC DISEASE; DERMATITIS; DIABETES MELLITUS, TYPE II, ON INSULIN, CONTROLLED; Eosinophilia; HYPERLIPIDEMIA; HYPERTENSION; LOW BACK PAIN; PRURITUS (2011); RENAL CELL CANCER (01/25/2005); SEPTIC ARTHRITIS (07/2009); and SHOULDER PAIN, RIGHT, CHRONIC. He is a patient of Dr. Quay Burow, who I am seeing today in Saturday clinic for pain in multiple joint sites. His pain is located in bilateral shoulders, left hand, and right knee. It has been an ongoing issues since December. Reports having imaging done at the [place] which showed no fractures - per patient.   He was seen by Wilfred Lacy, NP on 07/09/2016 and was treated for this isse with prednisone, robaxin and a toradol injection. He reports that some of his stiffness and pain is improving but he was unable to sleep at night due the discomfort.   He reports decreased ROM   He has an upcoming appointment with sports medicine on March 1  Review of Systems  All other systems reviewed and are negative.    See HPI  Past Medical History:  Diagnosis Date  . ANXIETY, SITUATIONAL   . DEGENERATIVE DISC DISEASE   . DERMATITIS   . DIABETES MELLITUS, TYPE II, ON INSULIN, CONTROLLED   . Eosinophilia   . HYPERLIPIDEMIA   . HYPERTENSION   . LOW BACK PAIN   . PRURITUS 2011  . RENAL CELL CANCER 01/25/2005   s/p partial nephrectomy  . SEPTIC ARTHRITIS 07/2009   L ankle - [facility]  . SHOULDER PAIN, RIGHT, CHRONIC     Social History   Social History  . Marital status: Married    Spouse name: N/A  . Number of children: N/A  . Years of education: N/A   Occupational History  . Not on file.   Social History Main Topics  . Smoking status: Former Smoker    Quit date: 05/31/1994  . Smokeless tobacco: Never Used     Comment: Married, lives with wife. Former Teacher, English as a foreign language  .  Alcohol use Yes     Comment: occasionally  . Drug use: No  . Sexual activity: Not on file   Other Topics Concern  . Not on file   Social History Narrative  . No narrative on file    Past Surgical History:  Procedure Laterality Date  . BACK SURGERY  04/2005   Dr. Annette Stable  . LAPAROSCOPIC PARTIAL NEPHRECTOMY  01/25/05   Dr. Risa Grill  . REPAIR ANKLE LIGAMENT  04/2009   disatal fib/infx    Family History  Problem Relation Age of Onset  . Asthma Father     No Known Allergies  Current Outpatient Prescriptions on File Prior to Visit  Medication Sig Dispense Refill  . amLODipine (NORVASC) 10 MG tablet Take 1 tablet (10 mg total) by mouth daily. 90 tablet 1  . aspirin 81 MG tablet Take 81 mg by mouth daily.      . BD PEN NEEDLE NANO U/F 32G X 4 MM MISC use as directed 100 each 1  . cetirizine (ZYRTEC) 10 MG tablet Take 10 mg by mouth daily. Reported on 06/11/2015    . diclofenac (VOLTAREN) 75 MG EC tablet Take 1 tablet (75 mg total) by mouth 2 (two) times daily. Yearly physical w/labs are due must see MD for refills 30 tablet 0  . insulin glargine (LANTUS) 100 UNIT/ML  injection Inject 38 Units into the skin daily.    Marland Kitchen lisinopril-hydrochlorothiazide (PRINZIDE,ZESTORETIC) 10-12.5 MG per tablet Take 1 tablet by mouth daily.    . metFORMIN (GLUCOPHAGE) 1000 MG tablet Take 1,000 mg by mouth 2 (two) times daily with a meal.     . methocarbamol (ROBAXIN) 500 MG tablet Take 1 tablet (500 mg total) by mouth every 8 (eight) hours as needed for muscle spasms. 21 tablet 0  . predniSONE (DELTASONE) 20 MG tablet Take 2 tablets (40 mg total) by mouth daily with breakfast. 10 tablet 0   No current facility-administered medications on file prior to visit.     BP 140/70 (BP Location: Right Arm, Patient Position: Sitting, Cuff Size: Normal)   Temp 98.7 F (37.1 C) (Oral)   Resp 14   Ht 5\' 7"  (1.702 m)   Wt 168 lb (76.2 kg)   SpO2 98%   BMI 26.31 kg/m       Objective:   Physical Exam    Constitutional: He is oriented to person, place, and time. He appears well-developed and well-nourished. No distress.  Cardiovascular: Normal rate, regular rhythm, normal heart sounds and intact distal pulses.  Exam reveals no gallop and no friction rub.   No murmur heard. Pulmonary/Chest: Effort normal and breath sounds normal. No respiratory distress. He has no wheezes. He has no rales. He exhibits no tenderness.  Musculoskeletal: He exhibits edema and tenderness. He exhibits no deformity.  Has limited ROM in bilateral shoulders. L>R. Unable to raise left arm above head. Trace swelling in left hand. No redness, warmth, or deformities noted.     Neurological: He is alert and oriented to person, place, and time.  Skin: Skin is warm and dry. No rash noted. He is not diaphoretic. No erythema. No pallor.  Psychiatric: He has a normal mood and affect. His behavior is normal. Judgment and thought content normal.  Nursing note and vitals reviewed.     Assessment & Plan:  1. Arthritis - Due to being Saturday clinic there is limited options to do today. He does not want to go to the hospital for blood work or Production manager. I will give him  5 days of tramadol. He can call on Monday to see if he can get in to see sports medicine any sooner.  - traMADol (ULTRAM) 50 MG tablet; Take 1 tablet (50 mg total) by mouth every 8 (eight) hours as needed for moderate pain.  Dispense: 15 tablet; Refill: 0 - Continue with current medication regimen   Dorothyann Peng, NP

## 2016-07-21 NOTE — Progress Notes (Signed)
[name] [address] [place], [place] [address] Phone: [phone] Subjective:    I'm seeing this patient by the request  of:   Binnie Rail, MD   CC: right shoulder pain body aches.   [id number]  Max [name] is a 71 y.o. male coming in with complaint of right shoulder pain Patient actually states in all his body seems to be hurting. The last 2 months and seems to be worsening over the course of time. Patient has seen multiple different providers for this. Seemed to start more in the right shoulder and since then is cut and expanded. Patient states that now it seems to be in the left shoulder, bilateral hips, as well as just all ovaries body. Patient's has been given different medications. States that the only thing that had been very helpful was the oxycodone that he was given an emergency room patient does not want to take this on a regular basis. Was given prednisone without any significant improvement and it didn't hurt his blood sugars he states. Patient states that unfortunately this pain is unrelenting day and night. Doesn't matter if it's with activity or without activity. Patient is concerned because if he continues to worsen he will be unable to do anything including daily activities. Rates the severity of pain is 10 out of 10.  Patient has not had any x-rays taken at this time. Discussed with him about having some done which he did not tear. States that he did have limited outside facility.    Past Medical History:  Diagnosis Date  . ANXIETY, SITUATIONAL   . DEGENERATIVE DISC DISEASE   . DERMATITIS   . DIABETES MELLITUS, TYPE II, ON INSULIN, CONTROLLED   . Eosinophilia   . HYPERLIPIDEMIA   . HYPERTENSION   . LOW BACK PAIN   . PRURITUS 2011  . RENAL CELL CANCER 01/25/2005   s/p partial nephrectomy  . SEPTIC ARTHRITIS 07/2009   L ankle - [facility]  . SHOULDER PAIN, RIGHT, CHRONIC    Past Surgical History:  Procedure Laterality Date  .  BACK SURGERY  04/2005   Dr. Annette Stable  . LAPAROSCOPIC PARTIAL NEPHRECTOMY  01/25/05   Dr. Risa Grill  . REPAIR ANKLE LIGAMENT  04/2009   disatal fib/infx   Social History   Social History  . Marital status: Married    Spouse name: N/A  . Number of children: N/A  . Years of education: N/A   Social History Main Topics  . Smoking status: Former Smoker    Quit date: 05/31/1994  . Smokeless tobacco: Never Used     Comment: Married, lives with wife. Former Teacher, English as a foreign language  . Alcohol use Yes     Comment: occasionally  . Drug use: No  . Sexual activity: Not Asked   Other Topics Concern  . None   Social History Narrative  . None   No Known Allergies Family History  Problem Relation Age of Onset  . Asthma Father     Past medical history, social, surgical and family history all reviewed in electronic medical record.  No pertanent information unless stated regarding to the chief complaint.   Review of Systems:Review of systems updated and as accurate as of 07/22/16  No , visual changes, nausea, vomiting, diarrhea, constipation, dizziness, abdominal pain, skin rash, fevers, chills, night sweats, weight loss, swollen lymph nodes,  chest pain, shortness of breath, mood changes.  Positive for muscle aches, body aches, joint pain, headache,  Objective  Blood pressure 128/72, pulse 86, height 5\' 7"  (1.702 m), weight 164 lb (74.4 kg), SpO2 96 %. Systems examined below as of 07/22/16   General: No apparent distress alert and oriented x3 mood and affect normal, dressed appropriately.  HEENT: Pupils equal, extraocular movements intact  Respiratory: Patient's speak in full sentences and does not appear short of breath  Cardiovascular: No lower extremity edema, non tender, no erythema  Skin: Warm dry intact with no signs of infection or rash on extremities or on axial skeleton.  Abdomen: Soft nontender  Neuro: Cranial nerves II through XII are intact, neurovascularly intact in all extremities with  2+ DTRs and 2+ pulses.  Lymph: No lymphadenopathy of posterior or anterior cervical chain or axillae bilaterally.  Gait Severely antalgic gait  MSK:  tender with full range of motion and good stability and symmetric strength and tone of shoulders, elbows, wrist, hip, knee and ankles bilaterally. Moderate to severe degenerative changes of multiple joints.  With testing patient does have symmetric strength of the shoulders before out of 5. Nearly full range of motion and noted. Patient is severely tender the overall muscles.    Impression and Recommendations:     This case required medical decision making of moderate complexity.      Note: This dictation was prepared with Dragon dictation along with smaller phrase technology. Any transcriptional errors that result from this process are unintentional.

## 2016-07-22 ENCOUNTER — Ambulatory Visit (INDEPENDENT_AMBULATORY_CARE_PROVIDER_SITE_OTHER): Payer: Medicare Other | Admitting: Family Medicine

## 2016-07-22 ENCOUNTER — Encounter: Payer: Self-pay | Admitting: Family Medicine

## 2016-07-22 ENCOUNTER — Other Ambulatory Visit (INDEPENDENT_AMBULATORY_CARE_PROVIDER_SITE_OTHER): Payer: Medicare Other

## 2016-07-22 ENCOUNTER — Other Ambulatory Visit: Payer: Self-pay | Admitting: Family Medicine

## 2016-07-22 VITALS — BP 128/72 | HR 86 | Ht 67.0 in | Wt 164.0 lb

## 2016-07-22 DIAGNOSIS — M2550 Pain in unspecified joint: Secondary | ICD-10-CM | POA: Diagnosis not present

## 2016-07-22 LAB — C-REACTIVE PROTEIN: CRP: 18.6 mg/dL (ref 0.5–20.0)

## 2016-07-22 LAB — VITAMIN D 25 HYDROXY (VIT D DEFICIENCY, FRACTURES): VITD: 5.54 ng/mL — AB (ref 30.00–100.00)

## 2016-07-22 LAB — CBC WITH DIFFERENTIAL/PLATELET
BASOS ABS: 0.1 10*3/uL (ref 0.0–0.1)
BASOS PCT: 1 % (ref 0.0–3.0)
EOS PCT: 1.9 % (ref 0.0–5.0)
Eosinophils Absolute: 0.2 10*3/uL (ref 0.0–0.7)
HEMATOCRIT: 28.5 % — AB (ref 39.0–52.0)
Hemoglobin: 9.4 g/dL — ABNORMAL LOW (ref 13.0–17.0)
LYMPHS ABS: 0.9 10*3/uL (ref 0.7–4.0)
LYMPHS PCT: 10.7 % — AB (ref 12.0–46.0)
MCHC: 33 g/dL (ref 30.0–36.0)
MCV: 85.7 fl (ref 78.0–100.0)
Monocytes Absolute: 0.9 10*3/uL (ref 0.1–1.0)
Monocytes Relative: 10.8 % (ref 3.0–12.0)
NEUTROS ABS: 6.3 10*3/uL (ref 1.4–7.7)
NEUTROS PCT: 75.6 % (ref 43.0–77.0)
PLATELETS: 413 10*3/uL — AB (ref 150.0–400.0)
RBC: 3.33 Mil/uL — ABNORMAL LOW (ref 4.22–5.81)
RDW: 14.2 % (ref 11.5–15.5)
WBC: 8.3 10*3/uL (ref 4.0–10.5)

## 2016-07-22 LAB — COMPREHENSIVE METABOLIC PANEL
ALBUMIN: 3.5 g/dL (ref 3.5–5.2)
ALT: 5 U/L (ref 0–53)
AST: 8 U/L (ref 0–37)
Alkaline Phosphatase: 76 U/L (ref 39–117)
BILIRUBIN TOTAL: 0.4 mg/dL (ref 0.2–1.2)
BUN: 17 mg/dL (ref 6–23)
CALCIUM: 9.1 mg/dL (ref 8.4–10.5)
CHLORIDE: 96 meq/L (ref 96–112)
CO2: 30 mEq/L (ref 19–32)
CREATININE: 1.18 mg/dL (ref 0.40–1.50)
GFR: 78.47 mL/min (ref >60.00)
Glucose, Bld: 172 mg/dL — ABNORMAL HIGH (ref 70–99)
Potassium: 3.9 mEq/L (ref 3.5–5.1)
SODIUM: 135 meq/L (ref 135–145)
Total Protein: 7.8 g/dL (ref 6.0–8.3)

## 2016-07-22 LAB — IBC PANEL
Iron: 3 ug/dL — ABNORMAL LOW (ref 42–165)
Saturation Ratios: 1 % — ABNORMAL LOW (ref 20.0–50.0)
Transferrin: 212 mg/dL (ref 212.0–360.0)

## 2016-07-22 LAB — CK: CK TOTAL: 44 U/L (ref 7–232)

## 2016-07-22 LAB — FOLATE: Folate: 9.9 ng/mL (ref >5.9)

## 2016-07-22 LAB — TSH: TSH: 0.84 u[IU]/mL (ref 0.35–4.50)

## 2016-07-22 LAB — VITAMIN B12: Vitamin B-12: 243 pg/mL (ref 211–911)

## 2016-07-22 LAB — SEDIMENTATION RATE: SED RATE: 118 mm/h — AB (ref 0–20)

## 2016-07-22 MED ORDER — MELOXICAM 7.5 MG PO TABS: 7.5000 mg | ORAL_TABLET | Freq: Every day | ORAL | 0 refills | Status: DC

## 2016-07-22 MED ORDER — GABAPENTIN 100 MG PO CAPS: 200.0000 mg | ORAL_CAPSULE | Freq: Every day | ORAL | 3 refills | Status: DC

## 2016-07-22 NOTE — Assessment & Plan Note (Signed)
Patient is having significant polyarthralgia at this point. Has had no past medical history significant for renal cell cancer as well as septic arthritis. Patient presentation is concerning for potential polymyalgia rheumatica as well. Laboratory workup ordered today for further evaluation with patient about pain control and made some changes in his medications. We will avoid any chronic narcotics due to patient's stating he would not like them. Monitor for any type of kidney discomfort as well with some the laboratories. We did discuss the possibility of getting x-rays which patient declined today. Do not feel that any type of injection will be beneficial with how much she is hurting at the moment. Patient is going to come back on the next 7-14 days for further evaluation. Worsening symptoms patient notices it medical attention in the emergency room setting.

## 2016-07-22 NOTE — Patient Instructions (Addendum)
Good to see you  Alvera Singh is your friend.  Gabapentin 200mg  nightly meloxicam daily for next 10 days.  We will get labs today  See me again in 2 weeks and we will make sure you are doing better.

## 2016-07-22 NOTE — Telephone Encounter (Signed)
Refill done.  

## 2016-07-23 ENCOUNTER — Telehealth: Payer: Self-pay | Admitting: Family Medicine

## 2016-07-23 ENCOUNTER — Encounter (HOSPITAL_COMMUNITY): Payer: Self-pay | Admitting: Emergency Medicine

## 2016-07-23 ENCOUNTER — Observation Stay (HOSPITAL_COMMUNITY)
Admission: EM | Admit: 2016-07-23 | Discharge: 2016-07-23 | Disposition: A | Discharge status: Home / Self Care | Payer: Medicare Other | Attending: Internal Medicine | Admitting: Internal Medicine

## 2016-07-23 DIAGNOSIS — R799 Abnormal finding of blood chemistry, unspecified: Secondary | ICD-10-CM | POA: Insufficient documentation

## 2016-07-23 DIAGNOSIS — Z794 Long term (current) use of insulin: Secondary | ICD-10-CM | POA: Insufficient documentation

## 2016-07-23 DIAGNOSIS — Z905 Acquired absence of kidney: Secondary | ICD-10-CM | POA: Diagnosis not present

## 2016-07-23 DIAGNOSIS — D509 Iron deficiency anemia, unspecified: Secondary | ICD-10-CM | POA: Diagnosis not present

## 2016-07-23 DIAGNOSIS — Z7982 Long term (current) use of aspirin: Secondary | ICD-10-CM | POA: Diagnosis not present

## 2016-07-23 DIAGNOSIS — Z79899 Other long term (current) drug therapy: Secondary | ICD-10-CM | POA: Insufficient documentation

## 2016-07-23 DIAGNOSIS — Z85528 Personal history of other malignant neoplasm of kidney: Secondary | ICD-10-CM | POA: Insufficient documentation

## 2016-07-23 DIAGNOSIS — Z87891 Personal history of nicotine dependence: Secondary | ICD-10-CM | POA: Diagnosis not present

## 2016-07-23 DIAGNOSIS — E119 Type 2 diabetes mellitus without complications: Secondary | ICD-10-CM | POA: Insufficient documentation

## 2016-07-23 DIAGNOSIS — I10 Essential (primary) hypertension: Secondary | ICD-10-CM | POA: Insufficient documentation

## 2016-07-23 DIAGNOSIS — R52 Pain, unspecified: Secondary | ICD-10-CM | POA: Diagnosis present

## 2016-07-23 LAB — FERRITIN: FERRITIN: 393 ng/mL — AB (ref 24–336)

## 2016-07-23 LAB — IRON AND TIBC
IRON: 11 ug/dL — AB (ref 45–182)
Saturation Ratios: 4 % — ABNORMAL LOW (ref 17.9–39.5)
TIBC: 272 ug/dL (ref 250–450)
UIBC: 261 ug/dL

## 2016-07-23 LAB — RHEUMATOID FACTOR: Rhuematoid fact SerPl-aCnc: <14 IU/mL (ref <14)

## 2016-07-23 LAB — ANA: Anti Nuclear Antibody(ANA): NEGATIVE

## 2016-07-23 LAB — ANGIOTENSIN CONVERTING ENZYME: ANGIOTENSIN-CONVERTING ENZYME: 6 U/L — AB (ref 9–67)

## 2016-07-23 LAB — CYCLIC CITRUL PEPTIDE ANTIBODY, IGG: Cyclic Citrullin Peptide Ab: <16 Units

## 2016-07-23 MED ORDER — FERROUS SULFATE 325 (65 FE) MG PO TABS: 325.0000 mg | ORAL_TABLET | Freq: Three times a day (TID) | ORAL | 0 refills | Status: DC

## 2016-07-23 MED ORDER — VITAMIN D (ERGOCALCIFEROL) 1.25 MG (50000 UNIT) PO CAPS: 50000.0000 [IU] | ORAL_CAPSULE | ORAL | 0 refills | Status: DC

## 2016-07-23 NOTE — ED Notes (Signed)
[name] at bedside with triage.

## 2016-07-23 NOTE — Progress Notes (Signed)
Patient is a 71 year old male who was recently established with a PCP and was referred to physical medicine and rehabilitation as a new outpatient for difficulty in ambulation. When patient was evaluated by PM&R, he had labs done which revealed a low vitamin D level. Patient was called in vitamin D for this. However, patient's iron level was found to be markedly low at 3. He was called with these results and told to go into the emergency room for an iron transfusion. Hospitalists were called for further assistance in setting up his iron transfusion.  In review of patient's lab work done as outpatient, it was noted that he did not have a ferritin level. His iron level was at 3 however his hemoglobin was only 9.4 and his MCV was 85.7. I discussed the results with Dr. Sharon Mt of hematology.  Given patient's stable MCV and overall stable hemoglobin, Dr. Benay Spice felt that emergent transfusion of iron was not indicated. He stated that iron results can be quite arbitrary given whatever patient had just eaten. Given that the patient can take by mouth and he is asymptomatic, it was recommended that patient just be started on iron sulfate 3 times a day and follow-up with his PCP and with physical medicine and rehabilitation in the next few weeks. I discussed this with the patient and he was quite amenable to this plan. Prior to discharge, I did order a repeat Iron level along with ferritin.

## 2016-07-23 NOTE — ED Provider Notes (Signed)
[facility] DEPT Provider Note   CSN: [id number] Arrival date & time: 07/23/16  0957     History   Chief Complaint Chief Complaint  Patient presents with  . Generalized Body Aches  . Abnormal Lab    HPI Max [name] is a 71 y.o. male.  Patient is a 71 year old male with a history of diabetes, hypertension, hyperlipidemia, renal cell cancer status post partial nephrectomy presenting today at his PCPs request. Patient has recently had multiple polymyalgias and polyarthralgias and was being seen by Dr. Tamala Julian with sports medicine. He over the last 6 months had been on multiple narcotic medications and muscle relaxers which would only help for short period of time. He had lab testing done yesterday which showed a low vitamin D level as well as a low iron level of 3. His doctor called him today and requested that he calm to the ER for an iron infusion and vitamin D injections were sent to his pharmacy.  Patient currently states he feels great today. Pain level of around 1 he denies any chest pain or shortness of breath. No dizziness or near syncope.   The history is provided by the patient.  Abnormal Lab  Time since result:  Low iron Patient referred by:  PCP Resulting agency:  Internal Result type: hematology     Past Medical History:  Diagnosis Date  . ANXIETY, SITUATIONAL   . DEGENERATIVE DISC DISEASE   . DERMATITIS   . DIABETES MELLITUS, TYPE II, ON INSULIN, CONTROLLED   . Eosinophilia   . HYPERLIPIDEMIA   . HYPERTENSION   . LOW BACK PAIN   . PRURITUS 2011  . RENAL CELL CANCER 01/25/2005   s/p partial nephrectomy  . SEPTIC ARTHRITIS 07/2009   L ankle - [facility]  . SHOULDER PAIN, RIGHT, CHRONIC     Patient Active Problem List   Diagnosis Date Noted  . Polyarthralgia 07/22/2016  . Acute pain of right shoulder 06/19/2016  . Olecranon bursitis of right elbow 05/22/2014  . SHOULDER PAIN, RIGHT, CHRONIC 06/10/2010  . PRURITUS 08/25/2009  . ANXIETY, SITUATIONAL 08/04/2009   . SEPTIC ARTHRITIS 08/04/2009  . EOSINOPHILIA 05/29/2009  . Diabetes ([place]) 01/16/2009  . DERMATITIS 01/16/2009  . HYPERLIPIDEMIA 01/15/2009  . Essential hypertension 01/15/2009  . DEGENERATIVE DISC DISEASE 01/15/2009  . LOW BACK PAIN 01/15/2009  . RENAL CELL CANCER 01/25/2005    Past Surgical History:  Procedure Laterality Date  . BACK SURGERY  04/2005   Dr. Annette Stable  . LAPAROSCOPIC PARTIAL NEPHRECTOMY  01/25/05   Dr. Risa Grill  . REPAIR ANKLE LIGAMENT  04/2009   disatal fib/infx       Home Medications    Prior to Admission medications   Medication Sig Start Date End Date Taking? Authorizing Provider  amLODipine (NORVASC) 10 MG tablet Take 1 tablet (10 mg total) by mouth daily. 02/02/12   Rowe Clack, MD  aspirin 81 MG tablet Take 81 mg by mouth daily.      Historical Provider, MD  BD PEN NEEDLE NANO U/F 32G X 4 MM MISC use as directed 02/17/13   Rowe Clack, MD  cetirizine (ZYRTEC) 10 MG tablet Take 10 mg by mouth daily. Reported on 06/11/2015    Historical Provider, MD  gabapentin (NEURONTIN) 100 MG capsule Take 2 capsules (200 mg total) by mouth at bedtime. 07/22/16   Lyndal Pulley, DO  insulin glargine (LANTUS) 100 UNIT/ML injection Inject 38 Units into the skin daily.    Historical Provider, MD  lisinopril-hydrochlorothiazide (  PRINZIDE,ZESTORETIC) 10-12.5 MG per tablet Take 1 tablet by mouth daily.    Historical Provider, MD  meloxicam (MOBIC) 7.5 MG tablet TAKE 1 TABLET(7.5 MG) BY MOUTH DAILY 07/22/16   Lyndal Pulley, DO  metFORMIN (GLUCOPHAGE) 1000 MG tablet Take 1,000 mg by mouth 2 (two) times daily with a meal.  12/09/10   Biagio Borg, MD  methocarbamol (ROBAXIN) 500 MG tablet Take 1 tablet (500 mg total) by mouth every 8 (eight) hours as needed for muscle spasms. 07/09/16   Flossie Buffy, NP  Vitamin D, Ergocalciferol, (DRISDOL) 50000 units CAPS capsule Take 1 capsule (50,000 Units total) by mouth every 7 (seven) days. 07/23/16   Lyndal Pulley, DO     Family History Family History  Problem Relation Age of Onset  . Asthma Father     Social History Social History  Substance Use Topics  . Smoking status: Former Smoker    Quit date: 05/31/1994  . Smokeless tobacco: Never Used     Comment: Married, lives with wife. Former Teacher, English as a foreign language  . Alcohol use Yes     Comment: occasionally     Allergies   Patient has no known allergies.   Review of Systems Review of Systems  All other systems reviewed and are negative.    Physical Exam Updated Vital Signs BP 165/76 (BP Location: Right Arm)   Pulse 99   Temp 99.3 F (37.4 C) (Oral)   Resp 18   Wt 164 lb (74.4 kg)   SpO2 100%   BMI 25.69 kg/m   Physical Exam  Constitutional: He is oriented to person, place, and time. He appears well-developed and well-nourished. No distress.  HENT:  Head: Normocephalic and atraumatic.  Mouth/Throat: Oropharynx is clear and moist.  Eyes: Conjunctivae and EOM are normal. Pupils are equal, round, and reactive to light.  Neck: Normal range of motion. Neck supple.  Cardiovascular: Normal rate, regular rhythm and intact distal pulses.   No murmur heard. Pulmonary/Chest: Effort normal and breath sounds normal. No respiratory distress. He has no wheezes. He has no rales.  Musculoskeletal: Normal range of motion. He exhibits no edema or tenderness.  No joint swelling or rashes  Neurological: He is alert and oriented to person, place, and time.  Skin: Skin is warm and dry. No rash noted. No erythema.  Psychiatric: He has a normal mood and affect. His behavior is normal.  Nursing note and vitals reviewed.    ED Treatments / Results  Labs (all labs ordered are listed, but only abnormal results are displayed) Labs Reviewed - No data to display  EKG  EKG Interpretation None       Radiology No results found.  Procedures Procedures (including critical care time)  Medications Ordered in ED Medications - No data to  display   Initial Impression / Assessment and Plan / ED Course  I have reviewed the triage vital signs and the nursing notes.  Pertinent labs & imaging results that were available during my care of the patient were reviewed by me and considered in my medical decision making (see chart for details).     Patient is here today because he was called by Dr. Tamala Julian to come to the emergency room to get an iron transfusion. Patient had lab testing done yesterday that showed a low vitamin D level as well as a low iron. Because of the significance of the low iron and were concerned he could develop complications. Patient currently is asymptomatic. He has had  a drop in hemoglobin from 11-9 in the last few months but denies any active bleeding. He currently started on some new medication yesterday for his polyarthralgias and states it has significantly helped his pain is only a 1.  Will discuss with hospitalist if this is possible or how to arrange an iron transfusion.  11:20 AM Spoke with Dr. Benay Spice and he does not feel that pt needs iron at this time but does need a few more tests and f/u with PCP Dr. Quay Burow.  Dr. Smitty Pluck will inform Dr. Tamala Julian  Final Clinical Impressions(s) / ED Diagnoses   Final diagnoses:  Iron deficiency anemia, unspecified iron deficiency anemia type    New Prescriptions New Prescriptions   FERROUS SULFATE 325 (65 FE) MG TABLET    Take 1 tablet (325 mg total) by mouth 3 (three) times daily with meals.     Blanchie Dessert, MD 07/23/16 1121

## 2016-07-23 NOTE — ED Triage Notes (Signed)
Pt verbalizes sent from PCP "for low Vitamin D." Pt verbalizes "labs checked because of generalized pain" onset 05/27/16.

## 2016-07-23 NOTE — Telephone Encounter (Signed)
Called patient to discuss labs. Patient's vitamin D extremely low. Once weekly vitamin D sent to his pharmacy. In addition this patient is severely iron deficient. Discussed with patient at this low amounts patient is at increased risk of kidney malfunction, as well as heart attack. Patient encouraged to go to emergency department. Patient does not want to at this moment. Patient wants to see if we can potentially getting outpatient IV infusion done. Told him at this moment this will be difficult but we'll try one more time but if having worsening symptoms patient needs to seek medical attention immediately. Patient agrees with plan.  We'll have staff help me see if we can get IV iron infusion today

## 2016-07-23 NOTE — Telephone Encounter (Signed)
Spoke with patient regarding getting the infusion through the ER today. He is going to head over to the ER for the infusion this morning.

## 2016-07-29 ENCOUNTER — Ambulatory Visit: Payer: Medicare Other | Admitting: Family Medicine

## 2016-08-05 ENCOUNTER — Ambulatory Visit (INDEPENDENT_AMBULATORY_CARE_PROVIDER_SITE_OTHER): Payer: Medicare Other | Admitting: Family Medicine

## 2016-08-05 ENCOUNTER — Encounter: Payer: Self-pay | Admitting: Family Medicine

## 2016-08-05 VITALS — BP 144/72 | HR 79 | Ht 67.0 in | Wt 165.0 lb

## 2016-08-05 DIAGNOSIS — D508 Other iron deficiency anemias: Secondary | ICD-10-CM

## 2016-08-05 NOTE — Assessment & Plan Note (Signed)
Patient is a severe iron deficiency anemia. We do not see any signs of significant continued family and the patient has had a history of renal cell cancer. Patient's has had difficulty also with reflex previously and I do feel a referral to gastroenterology to see if there is any blood loss can be beneficial. I would like patient to also be referred to hematology. Patient did have a severe amount of iron deficiency and did have an elevated ferritin level. No sign of infection and possibly rule out oncology that could be contributing discussed with patient that we may need to consider another IV infusion of iron patient starts having worsening symptoms again. Patient is in agreement with plan. We'll follow-up with me in 4 weeks.  Spent  25 minutes with patient face-to-face and had greater than 50% of counseling including as described above in assessment and plan.

## 2016-08-05 NOTE — Patient Instructions (Signed)
I am so glad you are feeling better.  I lied.  I do want you to see a couple docs. We will have GI and hematology see you.  Continue the vitamin D Iron 1 pill daily with 500mg  of vitamin C Meloxicam as needed for pain  See me again when you need me but I am here as well if you have questions.

## 2016-08-05 NOTE — Progress Notes (Signed)
[facility] [address] [place], [place] [address] Phone: [phone] Subjective:    I'm seeing this patient by the request  of:   Binnie Rail, MD   CC: right shoulder pain body aches f/u  [id number]  Max [name] is a 71 y.o. male coming in with complaint of body aches. Patient did see me and was found to have a severe iron deficiency. Patient was sent to the emergency room where he did have an effusion. Patient states since then he is felt approximately 80% better. Patient has noticed increasing energy, increasing strength and significant decrease in pain. Patient is taking iron orally 3 times a day but is having significant constipation. Patient did start the vitamin D supplementation as well and has done fairly well. Patient still states that because of multiple flights of stairs he seems to be somewhat colloid. Does state that he has had a black stools but contributed more to the iron. Denies any abdominal pain.      Past Medical History:  Diagnosis Date  . ANXIETY, SITUATIONAL   . DEGENERATIVE DISC DISEASE   . DERMATITIS   . DIABETES MELLITUS, TYPE II, ON INSULIN, CONTROLLED   . Eosinophilia   . HYPERLIPIDEMIA   . HYPERTENSION   . LOW BACK PAIN   . PRURITUS 2011  . RENAL CELL CANCER 01/25/2005   s/p partial nephrectomy  . SEPTIC ARTHRITIS 07/2009   L ankle - [facility]  . SHOULDER PAIN, RIGHT, CHRONIC    Past Surgical History:  Procedure Laterality Date  . BACK SURGERY  04/2005   Dr. Annette Stable  . LAPAROSCOPIC PARTIAL NEPHRECTOMY  01/25/05   Dr. Risa Grill  . REPAIR ANKLE LIGAMENT  04/2009   disatal fib/infx   Social History   Social History  . Marital status: Married    Spouse name: N/A  . Number of children: N/A  . Years of education: N/A   Social History Main Topics  . Smoking status: Former Smoker    Quit date: 05/31/1994  . Smokeless tobacco: Never Used     Comment: Married, lives with wife. Former Teacher, English as a foreign language  . Alcohol use Yes      Comment: occasionally  . Drug use: No  . Sexual activity: Not Asked   Other Topics Concern  . None   Social History Narrative  . None   No Known Allergies Family History  Problem Relation Age of Onset  . Asthma Father     Past medical history, social, surgical and family history all reviewed in electronic medical record.  No pertanent information unless stated regarding to the chief complaint.   Review of Systems:Review of systems updated and as accurate as of 08/05/16 Continues to be positive for mild body aches, muscle aches, constipation, black stool Denies any fever, chills, any abnormal weight loss, any abdominal pain, headaches, visual changes, shortness of breath, leg swelling.  Objective  Blood pressure (!) 144/72, pulse 79, height 5\' 7"  (1.702 m), weight 165 lb (74.8 kg), SpO2 100 %.   Systems examined below as of 08/05/16 General: NAD A&O x3 mood, affect normal  HEENT: Pupils equal, extraocular movements intact no nystagmus Respiratory: not short of breath at rest or with speaking Cardiovascular: No lower extremity edema, non tender Skin: Warm dry intact with no signs of infection or rash on extremities or on axial skeleton. Abdomen: Soft nontender, no masses Neuro: Cranial nerves  intact, neurovascularly intact in all extremities with 2+ DTRs and 2+ pulses. Lymph:  No lymphadenopathy appreciated today  Gait normal with good balance and coordination.  MSK: Non tender with full range of motion and good stability and symmetric strength and tone of shoulders, elbows, wrist,  knee hips and ankles bilaterally.  Has arthritic changes of multiple joints but nontender on exam today.   Impression and Recommendations:     This case required medical decision making of moderate complexity.      Note: This dictation was prepared with Dragon dictation along with smaller phrase technology. Any transcriptional errors that result from this process are unintentional.

## 2016-08-06 ENCOUNTER — Encounter: Payer: Self-pay | Admitting: Gastroenterology

## 2016-08-12 ENCOUNTER — Other Ambulatory Visit: Payer: Self-pay | Admitting: Family Medicine

## 2016-08-17 ENCOUNTER — Encounter (HOSPITAL_COMMUNITY): Payer: Self-pay

## 2016-08-17 ENCOUNTER — Emergency Department (HOSPITAL_COMMUNITY)
Admission: EM | Admit: 2016-08-17 | Discharge: 2016-08-17 | Disposition: A | Discharge status: Home / Self Care | Payer: Medicare Other | Attending: Dermatology | Admitting: Dermatology

## 2016-08-17 DIAGNOSIS — R0789 Other chest pain: Secondary | ICD-10-CM | POA: Diagnosis not present

## 2016-08-17 DIAGNOSIS — R079 Chest pain, unspecified: Secondary | ICD-10-CM | POA: Diagnosis not present

## 2016-08-17 DIAGNOSIS — Z5321 Procedure and treatment not carried out due to patient leaving prior to being seen by health care provider: Secondary | ICD-10-CM | POA: Insufficient documentation

## 2016-08-17 NOTE — ED Triage Notes (Signed)
Patient has a raised and tender area on his upper sternum x 1 year, but states the area is tender when he pushes on it. Patient denies SOB.

## 2016-08-17 NOTE — ED Notes (Signed)
Patient called x 3 for transfer to room 14, but no answer.

## 2016-09-14 DIAGNOSIS — L309 Dermatitis, unspecified: Secondary | ICD-10-CM | POA: Diagnosis not present

## 2016-09-20 ENCOUNTER — Ambulatory Visit: Payer: Medicare Other | Admitting: Gastroenterology

## 2016-10-18 ENCOUNTER — Telehealth: Payer: Self-pay | Admitting: Internal Medicine

## 2016-10-18 NOTE — Telephone Encounter (Signed)
Attempted to call patient to schedule awv. Patient's phone is busy. Will try to contact patient at a later time.

## 2017-04-23 ENCOUNTER — Encounter (HOSPITAL_COMMUNITY): Payer: Self-pay

## 2017-04-23 ENCOUNTER — Emergency Department (HOSPITAL_COMMUNITY)
Admission: EM | Admit: 2017-04-23 | Discharge: 2017-04-23 | Disposition: A | Discharge status: Home / Self Care | Payer: Medicare Other | Attending: Emergency Medicine | Admitting: Emergency Medicine

## 2017-04-23 DIAGNOSIS — Z794 Long term (current) use of insulin: Secondary | ICD-10-CM | POA: Insufficient documentation

## 2017-04-23 DIAGNOSIS — S0501XA Injury of conjunctiva and corneal abrasion without foreign body, right eye, initial encounter: Secondary | ICD-10-CM | POA: Insufficient documentation

## 2017-04-23 DIAGNOSIS — Z7982 Long term (current) use of aspirin: Secondary | ICD-10-CM | POA: Insufficient documentation

## 2017-04-23 DIAGNOSIS — Y999 Unspecified external cause status: Secondary | ICD-10-CM | POA: Diagnosis not present

## 2017-04-23 DIAGNOSIS — Z87891 Personal history of nicotine dependence: Secondary | ICD-10-CM | POA: Insufficient documentation

## 2017-04-23 DIAGNOSIS — I10 Essential (primary) hypertension: Secondary | ICD-10-CM | POA: Diagnosis not present

## 2017-04-23 DIAGNOSIS — Z79899 Other long term (current) drug therapy: Secondary | ICD-10-CM | POA: Insufficient documentation

## 2017-04-23 DIAGNOSIS — E119 Type 2 diabetes mellitus without complications: Secondary | ICD-10-CM | POA: Insufficient documentation

## 2017-04-23 DIAGNOSIS — X58XXXA Exposure to other specified factors, initial encounter: Secondary | ICD-10-CM | POA: Diagnosis not present

## 2017-04-23 DIAGNOSIS — Y9389 Activity, other specified: Secondary | ICD-10-CM | POA: Insufficient documentation

## 2017-04-23 DIAGNOSIS — Y929 Unspecified place or not applicable: Secondary | ICD-10-CM | POA: Insufficient documentation

## 2017-04-23 MED ORDER — TETRACAINE HCL 0.5 % OP SOLN
1.0000 [drp] | Freq: Once | OPHTHALMIC | Status: AC
  Administered 2017-04-23: 1 [drp] via OPHTHALMIC
  Filled 2017-04-23: qty 4

## 2017-04-23 MED ORDER — FLUORESCEIN SODIUM 1 MG OP STRP
1.0000 | ORAL_STRIP | Freq: Once | OPHTHALMIC | Status: AC
  Administered 2017-04-23: 1 via OPHTHALMIC
  Filled 2017-04-23: qty 1

## 2017-04-23 MED ORDER — ERYTHROMYCIN 5 MG/GM OP OINT
TOPICAL_OINTMENT | Freq: Four times a day (QID) | OPHTHALMIC | Status: DC
  Administered 2017-04-23: 1 via OPHTHALMIC
  Filled 2017-04-23: qty 3.5

## 2017-04-23 NOTE — ED Provider Notes (Signed)
[facility] DEPT Provider Note  CSN: [id number] Arrival date & time: 04/23/17 4580  Chief Complaint(s) Eye Injury  HPI Max [name] is a 71 y.o. male who presents with sensation of foreign body in the right eye following traumatic injury that occurred yesterday.  He reports that while cleaning out his wife's closet he had his right eye on the corner of a box.  7-9 hours later he began having foreign body sensation.  This has been persistent since last night around 9 PM.  Exacerbated with eye movement.  No alleviating factors.  He has associated increased lacrimation.  Denies any visual changes, eye pain.  No facial pain or swelling.  HPI  Past Medical History Past Medical History:  Diagnosis Date  . ANXIETY, SITUATIONAL   . DEGENERATIVE DISC DISEASE   . DERMATITIS   . DIABETES MELLITUS, TYPE II, ON INSULIN, CONTROLLED   . Eosinophilia   . HYPERLIPIDEMIA   . HYPERTENSION   . LOW BACK PAIN   . PRURITUS 2011  . RENAL CELL CANCER 01/25/2005   s/p partial nephrectomy  . SEPTIC ARTHRITIS 07/2009   L ankle - [facility]  . SHOULDER PAIN, RIGHT, CHRONIC    Patient Active Problem List   Diagnosis Date Noted  . Iron deficiency anemia 07/23/2016  . Polyarthralgia 07/22/2016  . Acute pain of right shoulder 06/19/2016  . Olecranon bursitis of right elbow 05/22/2014  . SHOULDER PAIN, RIGHT, CHRONIC 06/10/2010  . PRURITUS 08/25/2009  . ANXIETY, SITUATIONAL 08/04/2009  . SEPTIC ARTHRITIS 08/04/2009  . EOSINOPHILIA 05/29/2009  . Diabetes ([place]) 01/16/2009  . DERMATITIS 01/16/2009  . HYPERLIPIDEMIA 01/15/2009  . Essential hypertension 01/15/2009  . DEGENERATIVE DISC DISEASE 01/15/2009  . LOW BACK PAIN 01/15/2009  . RENAL CELL CANCER 01/25/2005   Home Medication(s) Prior to Admission medications   Medication Sig Start Date End Date Taking? Authorizing Provider  amLODipine (NORVASC) 10 MG tablet Take 1 tablet (10 mg total) by mouth daily. 02/02/12   Rowe Clack, MD  aspirin 81 MG tablet Take 81 mg by mouth daily.      [provider]  BD PEN NEEDLE NANO U/F 32G X 4 MM MISC use as directed 02/17/13   Rowe Clack, MD  cetirizine (ZYRTEC) 10 MG tablet Take 10 mg by mouth daily. Reported on 06/11/2015    [provider]  ferrous sulfate 325 (65 FE) MG tablet Take 1 tablet (325 mg total) by mouth 3 (three) times daily with meals. 07/23/16   Blanchie Dessert, MD  gabapentin (NEURONTIN) 100 MG capsule Take 2 capsules (200 mg total) by mouth at bedtime. 07/22/16   Lyndal Pulley, DO  insulin glargine (LANTUS) 100 UNIT/ML injection Inject 38 Units into the skin daily.    [provider]  lisinopril-hydrochlorothiazide (PRINZIDE,ZESTORETIC) 10-12.5 MG per tablet Take 1 tablet by mouth daily.    [provider]  meloxicam (MOBIC) 7.5 MG tablet TAKE 1 TABLET(7.5 MG) BY MOUTH DAILY 07/22/16   Lyndal Pulley, DO  metFORMIN (GLUCOPHAGE) 1000 MG tablet Take 1,000 mg by mouth 2 (two) times daily with a meal.  12/09/10   Biagio Borg, MD  methocarbamol (ROBAXIN) 500 MG tablet Take 1 tablet (500 mg total) by mouth every 8 (eight) hours as needed for muscle spasms. 07/09/16   Nche, Charlene Brooke, NP  Vitamin D, Ergocalciferol, (DRISDOL) 50000 units CAPS capsule TAKE ONE CAPSULE BY MOUTH EVERY 7 DAYS 08/12/16   Lyndal Pulley, DO  Past Surgical History Past Surgical History:  Procedure Laterality Date  . BACK SURGERY  04/2005   Dr. Annette Stable  . LAPAROSCOPIC PARTIAL NEPHRECTOMY  01/25/05   Dr. Risa Grill  . REPAIR ANKLE LIGAMENT  04/2009   disatal fib/infx   Family History Family History  Problem Relation Age of Onset  . Asthma Father     Social History Social History   Tobacco Use  . Smoking status: Former Smoker    Last attempt to quit: 05/31/1994    Years since quitting: 22.9  . Smokeless  tobacco: Never Used  . 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 Alcohol use: Yes    Comment: occasionally  . Drug use: No   Allergies Patient has no known allergies.  Review of Systems Review of Systems All other systems are reviewed and are negative for acute change except as noted in the HPI  Physical Exam Vital Signs  I have reviewed the triage vital signs BP (!) 160/78 (BP Location: Right Arm)   Pulse 86   Temp 98 F (36.7 C) (Oral)   Resp 18   SpO2 100%   Physical Exam  Constitutional: He is oriented to person, place, and time. He appears well-developed and well-nourished. No distress.  HENT:  Head: Normocephalic and atraumatic.  Right Ear: External ear normal.  Left Ear: External ear normal.  Nose: Nose normal.  Mouth/Throat: Mucous membranes are normal. No trismus in the jaw.  Eyes: Conjunctivae and EOM are normal. No scleral icterus.  Slit lamp exam:      The right eye shows corneal abrasion (at about 6 and 7 o'clock.  No Seidel's sign.) and an anterior chamber bulge. The right eye shows no corneal ulcer, no foreign body and no hyphema.       The left eye shows no corneal abrasion, no corneal ulcer, no foreign body, no hyphema and no fluorescein uptake.  Visual acuity: 20/70 bilaterally without corrective lenses.  Corrective lenses unavailable at this time, as they are in his car.   Neck: Normal range of motion and phonation normal.  Cardiovascular: Normal rate and regular rhythm.  Pulmonary/Chest: Effort normal. No stridor. No respiratory distress.  Abdominal: He exhibits no distension.  Musculoskeletal: Normal range of motion. He exhibits no edema.  Neurological: He is alert and oriented to person, place, and time.  Skin: He is not diaphoretic.  Psychiatric: He has a normal mood and affect. His behavior is normal.  Vitals reviewed.   ED Results and Treatments Labs (all labs ordered are listed, but only  abnormal results are displayed) Labs Reviewed - No data to display                                                                                                                       EKG  EKG Interpretation  Date/Time:    Ventricular Rate:    PR Interval:    QRS Duration:   QT Interval:  QTC Calculation:   R Axis:     Text Interpretation:        Radiology No results found. Pertinent labs & imaging results that were available during my care of the patient were reviewed by me and considered in my medical decision making (see chart for details).  Medications Ordered in ED Medications  erythromycin ophthalmic ointment (not administered)  fluorescein ophthalmic strip 1 strip (1 strip Both Eyes Given by Other 04/23/17 0854)  tetracaine (PONTOCAINE) 0.5 % ophthalmic solution 1 drop (1 drop Both Eyes Given by Other 04/23/17 0854)                                                                                                                                    Procedures Procedures  (including critical care time)  Medical Decision Making / ED Course I have reviewed the nursing notes for this encounter and the patient's prior records (if available in EHR or on provided paperwork).    Presentation consistent with corneal abrasion.  No evidence to suggest ruptured globe or hyphema.  Visual fields and acuity intact.  No foreign body noted.  Will treat with ophthalmic ointment.  The patient is safe for discharge with strict return precautions.   Final Clinical Impression(s) / ED Diagnoses Final diagnoses:  Abrasion of right cornea, initial encounter    Disposition: Discharge  Condition: Good  I have discussed the results, Dx and Tx plan with the patient who expressed understanding and agree(s) with the plan. Discharge instructions discussed at great length. The patient was given strict return precautions who verbalized understanding of the instructions. No further questions at  time of discharge.    ED Discharge Orders    None       Follow Up: Binnie Rail, MD [place] [address] [phone]   As needed  Ophthalmologist  Schedule an appointment as soon as possible for a visit in 1 week      This chart was dictated using voice recognition software.  Despite best efforts to proofread,  errors can occur which can change the documentation meaning.   Fatima Blank, MD 04/23/17 [id number]

## 2017-04-23 NOTE — Discharge Instructions (Signed)
Erythromycin ophthalmic ointment: Right Eye, Every 6 hours, For 5 days

## 2017-04-23 NOTE — ED Triage Notes (Signed)
Pt cleaning out closet last night.  Struck right eye on a box.  Pt states painful and watering.  No vision changes.

## 2017-05-17 ENCOUNTER — Ambulatory Visit (INDEPENDENT_AMBULATORY_CARE_PROVIDER_SITE_OTHER): Payer: Medicare Other | Admitting: Internal Medicine

## 2017-05-17 ENCOUNTER — Encounter: Payer: Self-pay | Admitting: Internal Medicine

## 2017-05-17 VITALS — BP 128/74 | HR 84 | Temp 98.8°F | Resp 16 | Wt 167.0 lb

## 2017-05-17 DIAGNOSIS — L03115 Cellulitis of right lower limb: Secondary | ICD-10-CM | POA: Diagnosis not present

## 2017-05-17 MED ORDER — DOXYCYCLINE HYCLATE 100 MG PO TABS: 100.0000 mg | ORAL_TABLET | Freq: Two times a day (BID) | ORAL | 0 refills | Status: DC

## 2017-05-17 NOTE — Assessment & Plan Note (Signed)
Symptoms and exam consistent with cellulitis.  No abscess Cause unknown-possibly related to eczema, increasing dry skin and he is itching more.  No obvious wound or break in skin Start doxycycline 100 mg twice daily times 10 days He is a diabetic and his sugars are well controlled, but will monitor them closely Stressed monitoring this infection closely and if he is not seeing improvement over the next 24-48 hours he needs to call or return

## 2017-05-17 NOTE — Progress Notes (Signed)
Subjective:    Patient ID: Max [name], male    DOB: [birth year], 71 y.o.   MRN: [id number]  HPI He is here for an acute visit.    Right knee and proximal lower leg swollen:  It started 3-4 days ago.  He denies any injury or accident to the knee.  He wonders if he was bit by something.  He does recall being on his knees a couple of times.  He does have increasing dry skin from eczema at this time a year.  His distal part of the knee is slightly painful.  His skin feels irritated and itchy.  He thinks there is some mild redness.  He denies any knee pain with walking or bending the knee.  Over the past few days he has felt a little cooler and has had needed to heat higher than usual.  He denies any fevers.  He denies any wounds or obvious insect bites.  He does have diabetes and his sugars have been well controlled at home.  [facility] is [facility]-to[facility] with his [facility] work and his diabetes is controlled.    Medications and allergies reviewed with patient and updated if appropriate.  Patient Active Problem List   Diagnosis Date Noted  . Iron deficiency anemia 07/23/2016  . Polyarthralgia 07/22/2016  . Acute pain of right shoulder 06/19/2016  . Olecranon bursitis of right elbow 05/22/2014  . SHOULDER PAIN, RIGHT, CHRONIC 06/10/2010  . PRURITUS 08/25/2009  . ANXIETY, SITUATIONAL 08/04/2009  . SEPTIC ARTHRITIS 08/04/2009  . EOSINOPHILIA 05/29/2009  . Diabetes ([place]) 01/16/2009  . DERMATITIS 01/16/2009  . HYPERLIPIDEMIA 01/15/2009  . Essential hypertension 01/15/2009  . DEGENERATIVE DISC DISEASE 01/15/2009  . LOW BACK PAIN 01/15/2009  . RENAL CELL CANCER 01/25/2005    Current Outpatient Medications on File Prior to Visit  Medication Sig Dispense Refill  . amLODipine (NORVASC) 10 MG tablet Take 1 tablet (10 mg total) by mouth daily. 90 tablet 1  . aspirin 81 MG tablet Take 81 mg by mouth daily.      . BD PEN NEEDLE NANO U/F 32G X 4 MM MISC use as directed 100 each 1    . cetirizine (ZYRTEC) 10 MG tablet Take 10 mg by mouth daily. Reported on 06/11/2015    . ferrous sulfate 325 (65 FE) MG tablet Take 1 tablet (325 mg total) by mouth 3 (three) times daily with meals. 90 tablet 0  . gabapentin (NEURONTIN) 100 MG capsule Take 2 capsules (200 mg total) by mouth at bedtime. 60 capsule 3  . insulin glargine (LANTUS) 100 UNIT/ML injection Inject 38 Units into the skin daily.    Marland Kitchen lisinopril-hydrochlorothiazide (PRINZIDE,ZESTORETIC) 10-12.5 MG per tablet Take 1 tablet by mouth daily.    . meloxicam (MOBIC) 7.5 MG tablet TAKE 1 TABLET(7.5 MG) BY MOUTH DAILY 90 tablet 0  . metFORMIN (GLUCOPHAGE) 1000 MG tablet Take 1,000 mg by mouth 2 (two) times daily with a meal.     . methocarbamol (ROBAXIN) 500 MG tablet Take 1 tablet (500 mg total) by mouth every 8 (eight) hours as needed for muscle spasms. 21 tablet 0  . Vitamin D, Ergocalciferol, (DRISDOL) 50000 units CAPS capsule TAKE ONE CAPSULE BY MOUTH EVERY 7 DAYS 12 capsule 0   No current facility-administered medications on file prior to visit.     Past Medical History:  Diagnosis Date  . ANXIETY, SITUATIONAL   . DEGENERATIVE DISC DISEASE   . DERMATITIS   . DIABETES  MELLITUS, TYPE II, ON INSULIN, CONTROLLED   . Eosinophilia   . HYPERLIPIDEMIA   . HYPERTENSION   . LOW BACK PAIN   . PRURITUS 2011  . RENAL CELL CANCER 01/25/2005   s/p partial nephrectomy  . SEPTIC ARTHRITIS 07/2009   L ankle - MSSA  . SHOULDER PAIN, RIGHT, CHRONIC     Past Surgical History:  Procedure Laterality Date  . BACK SURGERY  04/2005   Dr. Annette Stable  . LAPAROSCOPIC PARTIAL NEPHRECTOMY  01/25/05   Dr. Risa Grill  . REPAIR ANKLE LIGAMENT  04/2009   disatal fib/infx    Social History   Socioeconomic History  . Marital status: Married    Spouse name: None  . Number of children: None  . Years of education: None  . Highest education level: None  Social Needs  . Financial resource strain: None  . Food insecurity - worry: None  . Food  insecurity - inability: None  . Transportation needs - medical: None  . Transportation needs - non-medical: None  Occupational History  . None  Tobacco Use  . Smoking status: Former Smoker    Last attempt to quit: 05/31/1994    Years since quitting: 22.9  . Smokeless tobacco: Never Used  . 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 Alcohol use: Yes    Comment: occasionally  . Drug use: No  . Sexual activity: None  Other Topics Concern  . None  Social History Narrative  . None    Family History  Problem Relation Age of Onset  . Asthma Father     Review of Systems  Constitutional: Positive for chills. Negative for fever.  Cardiovascular: Positive for leg swelling.  Musculoskeletal: Negative for arthralgias and joint swelling.  Skin: Positive for color change (erythema). Negative for rash and wound.  Neurological: Negative for light-headedness and headaches.       Objective:   Vitals:   05/17/17 1547  BP: 128/74  Pulse: 84  Resp: 16  Temp: 98.8 F (37.1 C)  SpO2: 98%   Wt Readings from Last 3 Encounters:  05/17/17 167 lb (75.8 kg)  08/17/16 165 lb (74.8 kg)  08/05/16 165 lb (74.8 kg)   Body mass index is 26.16 kg/m.   Physical Exam  Constitutional: He is oriented to person, place, and time. He appears well-developed and well-nourished. No distress.  Musculoskeletal: He exhibits edema (Swelling from distal knee near the tibial process to mid calf-mild in nature, nonpitting).  Mild swelling distally at the tibial process, no patellar tendon tenderness.  No joint tenderness.  No pain with movement of the knee, except with extreme flexion  Neurological: He is alert and oriented to person, place, and time.  Skin: He is not diaphoretic. There is erythema (Mild erythema and warmth to touch proximal lower right extremity.  No open wounds, contact or insect bite).             Assessment & Plan:    See  Problem List for Assessment and Plan of chronic medical problems.

## 2017-05-17 NOTE — Patient Instructions (Addendum)
Take the antibiotic as prescribed and finish the complete course.   If your symptoms are not improving please call.    Make sure your diabetes is well controlled.      Cellulitis, Adult Cellulitis is a skin infection. The infected area is usually red and tender. This condition occurs most often in the arms and lower legs. The infection can travel to the muscles, blood, and underlying tissue and become serious. It is very important to get treated for this condition. What are the causes? Cellulitis is caused by bacteria. The bacteria enter through a break in the skin, such as a cut, burn, insect bite, open sore, or crack. What increases the risk? This condition is more likely to occur in people who:  Have a weak defense system (immune system).  Have open wounds on the skin such as cuts, Junetta Hearn, bites, and scrapes. Bacteria can enter the body through these open wounds.  Are older.  Have diabetes.  Have a type of long-lasting (chronic) liver disease (cirrhosis) or kidney disease.  Use IV drugs.  What are the signs or symptoms? Symptoms of this condition include:  Redness, streaking, or spotting on the skin.  Swollen area of the skin.  Tenderness or pain when an area of the skin is touched.  Warm skin.  Fever.  Chills.  Blisters.  How is this diagnosed? This condition is diagnosed based on a medical history and physical exam. You may also have tests, including:  Blood tests.  Lab tests.  Imaging tests.  How is this treated? Treatment for this condition may include:  Medicines, such as antibiotic medicines or antihistamines.  Supportive care, such as rest and application of cold or warm cloths (cold or warm compresses) to the skin.  Hospital care, if the condition is severe.  The infection usually gets better within 1-2 days of treatment. Follow these instructions at home:  Take over-the-counter and prescription medicines only as told by your health care  provider.  If you were prescribed an antibiotic medicine, take it as told by your health care provider. Do not stop taking the antibiotic even if you start to feel better.  Drink enough fluid to keep your urine clear or pale yellow.  Do not touch or rub the infected area.  Raise (elevate) the infected area above the level of your heart while you are sitting or lying down.  Apply warm or cold compresses to the affected area as told by your health care provider.  Keep all follow-up visits as told by your health care provider. This is important. These visits let your health care provider make sure a more serious infection is not developing. Contact a health care provider if:  You have a fever.  Your symptoms do not improve within 1-2 days of starting treatment.  Your bone or joint underneath the infected area becomes painful after the skin has healed.  Your infection returns in the same area or another area.  You notice a swollen bump in the infected area.  You develop new symptoms.  You have a general ill feeling (malaise) with muscle aches and pains. Get help right away if:  Your symptoms get worse.  You feel very sleepy.  You develop vomiting or diarrhea that persists.  You notice red streaks coming from the infected area.  Your red area gets larger or turns dark in color. This information is not intended to replace advice given to you by your health care provider. Make sure you discuss any  questions you have with your health care provider. Document Released: 02/24/2005 Document Revised: 09/25/2015 Document Reviewed: 03/26/2015 Elsevier Interactive Patient Education  2017 [employer].

## 2017-06-14 DIAGNOSIS — L308 Other specified dermatitis: Secondary | ICD-10-CM | POA: Diagnosis not present

## 2018-01-18 DIAGNOSIS — L2084 Intrinsic (allergic) eczema: Secondary | ICD-10-CM | POA: Diagnosis not present
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
## 2018-01-31 ENCOUNTER — Ambulatory Visit (INDEPENDENT_AMBULATORY_CARE_PROVIDER_SITE_OTHER): Payer: Medicare Other | Admitting: Internal Medicine

## 2018-01-31 ENCOUNTER — Encounter: Payer: Self-pay | Admitting: Internal Medicine

## 2018-01-31 VITALS — BP 150/68 | HR 83 | Temp 98.2°F | Resp 16 | Ht 67.0 in | Wt 170.8 lb

## 2018-01-31 DIAGNOSIS — E119 Type 2 diabetes mellitus without complications: Secondary | ICD-10-CM

## 2018-01-31 DIAGNOSIS — I10 Essential (primary) hypertension: Secondary | ICD-10-CM

## 2018-01-31 DIAGNOSIS — Z794 Long term (current) use of insulin: Secondary | ICD-10-CM

## 2018-01-31 NOTE — Patient Instructions (Addendum)
Follow up with the [facility] regarding your paperwork and diabetes care.     [facility] [address]  [phone]

## 2018-01-31 NOTE — Assessment & Plan Note (Signed)
Blood pressure elevated here today, but typically it is well controlled This is managed by the [facility] and he does follow-up with them regularly so no changes will be made today
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
## 2018-04-20 ENCOUNTER — Telehealth: Payer: Self-pay | Admitting: Internal Medicine

## 2018-04-20 NOTE — Telephone Encounter (Signed)
Attempted to call [name] to schedule AWV, but patient did not answer. Will try to call patient at a later time. SF

## 2018-07-13 ENCOUNTER — Ambulatory Visit: Admit: 2018-07-13 | Payer: Medicare Other | Admitting: Gastroenterology

## 2018-07-13 SURGERY — COLONOSCOPY WITH PROPOFOL
Anesthesia: General

## 2019-03-05 ENCOUNTER — Encounter: Payer: Self-pay | Admitting: Gastroenterology

## 2019-05-03 ENCOUNTER — Ambulatory Visit: Payer: Medicare Other | Admitting: Family Medicine

## 2019-05-11 ENCOUNTER — Encounter: Payer: Self-pay | Admitting: Family Medicine

## 2019-05-11 ENCOUNTER — Ambulatory Visit (INDEPENDENT_AMBULATORY_CARE_PROVIDER_SITE_OTHER): Payer: Medicare Other | Admitting: Family Medicine

## 2019-05-11 ENCOUNTER — Other Ambulatory Visit: Payer: Self-pay

## 2019-05-11 VITALS — BP 160/80 | HR 83 | Ht 67.0 in | Wt 170.0 lb

## 2019-05-11 DIAGNOSIS — D508 Other iron deficiency anemias: Secondary | ICD-10-CM | POA: Diagnosis not present

## 2019-05-11 DIAGNOSIS — M2550 Pain in unspecified joint: Secondary | ICD-10-CM

## 2019-05-11 MED ORDER — VITAMIN D (ERGOCALCIFEROL) 1.25 MG (50000 UNIT) PO CAPS: 50000.0000 [IU] | ORAL_CAPSULE | ORAL | 0 refills | Status: DC

## 2019-05-11 MED ORDER — MELOXICAM 7.5 MG PO TABS: 7.5000 mg | ORAL_TABLET | Freq: Every day | ORAL | 0 refills | Status: DC

## 2019-05-11 NOTE — Assessment & Plan Note (Signed)
History of significant iron deficiency.  Patient has not been taking the iron on a regular basis.  Discussed iron and vitamin C supplementation.  Has also had a significant low vitamin D and encouraged him to start the once weekly again.  These has helped him in the past.  Meloxicam given for short course and will do 7.5 secondary to patient's advanced age.  Follow-up with me again in 3 weeks to make sure patient improves.

## 2019-05-11 NOTE — Progress Notes (Signed)
[name] [place], [place] [address] Phone: [phone] Subjective:   Fontaine No, am serving as a scribe for Dr. Hulan Saas. This visit occurred during the SARS-CoV-2 public health emergency.  Safety protocols were in place, including screening questions prior to the visit, additional usage of staff PPE, and extensive cleaning of exam room while observing appropriate contact time as indicated for disinfecting solutions.    CC: Pain all over  [id number]   07/22/2016 Patient is having significant polyarthralgia at this point. Has had no past medical history significant for renal cell cancer as well as septic arthritis. Patient presentation is concerning for potential polymyalgia rheumatica as well. Laboratory workup ordered today for further evaluation with patient about pain control and made some changes in his medications. We will avoid any chronic narcotics due to patient's stating he would not like them. Monitor for any type of kidney discomfort as well with some the laboratories. We did discuss the possibility of getting x-rays which patient declined today. Do not feel that any type of injection will be beneficial with how much she is hurting at the moment. Patient is going to come back on the next 7-14 days for further evaluation. Worsening symptoms patient notices it medical attention in the emergency room setting.  Update 05/11/2019 Max [name] is a 73 y.o. male coming in with complaint of left hip pain and right hand pain. Using Vitamin D 500mg  daily. Also having right hand pain cramps.  Patient states that the cramping seems to be increasing over the course of time here now.  Patient denies any significant swelling though.  Patient likes to stay active but finds it difficult to do so secondary to some of the pain.     Past Medical History:  Diagnosis Date  . ANXIETY, SITUATIONAL   . DEGENERATIVE DISC DISEASE   . DERMATITIS     . DIABETES MELLITUS, TYPE II, ON INSULIN, CONTROLLED   . Eosinophilia   . HYPERLIPIDEMIA   . HYPERTENSION   . LOW BACK PAIN   . PRURITUS 2011  . RENAL CELL CANCER 01/25/2005   s/p partial nephrectomy  . SEPTIC ARTHRITIS 07/2009   L ankle - MSSA  . SHOULDER PAIN, RIGHT, CHRONIC    Past Surgical History:  Procedure Laterality Date  . BACK SURGERY  04/2005   Dr. Annette Stable  . LAPAROSCOPIC PARTIAL NEPHRECTOMY  01/25/05   Dr. Risa Grill  . REPAIR ANKLE LIGAMENT  04/2009   disatal fib/infx   Social History   Socioeconomic History  . Marital status: Married    Spouse name: Not on file  . Number of children: Not on file  . Years of education: Not on file  . Highest education level: Not on file  Occupational History  . Not on file  Tobacco Use  . Smoking status: Former Smoker    Quit date: 05/31/1994    Years since quitting: 24.9  . Smokeless tobacco: Never Used  . 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 Alcohol use: Yes    Comment: occasionally  . Drug use: No  . Sexual activity: Not on file  Other Topics Concern  . Not on file  Social History Narrative  . Not on file   Social Determinants of Health   Financial Resource Strain:   . Difficulty of Paying Living Expenses: Not on file  Food Insecurity:   . Worried About [employer] of  Food in the Last Year: Not on file  . Ran Out of Food in the Last Year: Not on file  Transportation Needs:   . Lack of Transportation (Medical): Not on file  . Lack of Transportation (Non-Medical): Not on file  Physical Activity:   . Days of Exercise per Week: Not on file  . Minutes of Exercise per Session: Not on file  Stress:   . Feeling of Stress : Not on file  Social Connections:   . Frequency of Communication with Friends and Family: Not on file  . Frequency of Social Gatherings with Friends and Family: Not on file  . Attends Religious Services: Not on file  . Active Member of  Clubs or Organizations: Not on file  . Attends Archivist Meetings: Not on file  . Marital Status: Not on file   No Known Allergies Family History  Problem Relation Age of Onset  . Asthma Father     Current Outpatient Medications (Endocrine & Metabolic):  .  insulin glargine (LANTUS) 100 UNIT/ML injection, Inject 38 Units into the skin daily. .  metFORMIN (GLUCOPHAGE) 1000 MG tablet, Take 1,000 mg by mouth 2 (two) times daily with a meal.   Current Outpatient Medications (Cardiovascular):  .  amLODipine (NORVASC) 10 MG tablet, Take 1 tablet (10 mg total) by mouth daily. Marland Kitchen  lisinopril-hydrochlorothiazide (PRINZIDE,ZESTORETIC) 10-12.5 MG per tablet, Take 1 tablet by mouth daily.  Current Outpatient Medications (Respiratory):  .  cetirizine (ZYRTEC) 10 MG tablet, Take 10 mg by mouth daily. Reported on 06/11/2015  Current Outpatient Medications (Analgesics):  .  aspirin 81 MG tablet, Take 81 mg by mouth daily.   .  meloxicam (MOBIC) 7.5 MG tablet, TAKE 1 TABLET(7.5 MG) BY MOUTH DAILY .  meloxicam (MOBIC) 7.5 MG tablet, Take 1 tablet (7.5 mg total) by mouth daily.  Current Outpatient Medications (Hematological):  .  ferrous sulfate 325 (65 FE) MG tablet, Take 1 tablet (325 mg total) by mouth 3 (three) times daily with meals.  Current Outpatient Medications (Other):  Marland Kitchen  BD PEN NEEDLE NANO U/F 32G X 4 MM MISC, use as directed .  gabapentin (NEURONTIN) 100 MG capsule, Take 2 capsules (200 mg total) by mouth at bedtime. .  Vitamin D, Ergocalciferol, (DRISDOL) 50000 units CAPS capsule, TAKE ONE CAPSULE BY MOUTH EVERY 7 DAYS .  Vitamin D, Ergocalciferol, (DRISDOL) 1.25 MG (50000 UT) CAPS capsule, Take 1 capsule (50,000 Units total) by mouth every 7 (seven) days.    Past medical history, social, surgical and family history all reviewed in electronic medical record.  No pertanent information unless stated regarding to the chief complaint.   Review of Systems:  No headache, visual  changes, nausea, vomiting, diarrhea, constipation, dizziness, abdominal pain, skin rash, fevers, chills, night sweats, weight loss, swollen lymph nodes, body aches, joint swelling, , chest pain, shortness of breath, mood changes.  Positive muscle aches  Objective  Blood pressure (!) 160/80, pulse 83, height 5\' 7"  (1.702 m), weight 170 lb (77.1 kg), SpO2 97 %.    General: No apparent distress alert and oriented x3 mood and affect normal, dressed appropriately.  HEENT: Pupils equal, extraocular movements intact  Respiratory: Patient's speak in full sentences and does not appear short of breath  Cardiovascular: No lower extremity edema, non tender, no erythema  Skin: Warm dry intact with no signs of infection or rash on extremities or on axial skeleton.  Abdomen: Soft nontender  Neuro: Cranial nerves II through XII are intact, neurovascularly  intact in all extremities with 2+ DTRs and 2+ pulses.  Lymph: No lymphadenopathy of posterior or anterior cervical chain or axillae bilaterally.  Gait normal with good balance and coordination.  MSK:  tender with limited range of motion and good stability and symmetric strength and tone of shoulders, elbows, wrist, hip, knee and ankles bilaterally.  Moderate arthritic changes of multiple joints    Impression and Recommendations:     This case required medical decision making of moderate complexity. The above documentation has been reviewed and is accurate and complete Lyndal Pulley, DO       Note: This dictation was prepared with Dragon dictation along with smaller phrase technology. Any transcriptional errors that result from this process are unintentional.

## 2019-05-11 NOTE — Patient Instructions (Addendum)
Iron 65mg  with 500mg  Vitamin C Vitamin D once weekly Meloxicam daily for 2 weeks than as needed Call us in 3 weeks if not better Happy Holidays!    [address] [place], [place] [address] Phone [phone]

## 2019-07-05 DIAGNOSIS — L259 Unspecified contact dermatitis, unspecified cause: Secondary | ICD-10-CM | POA: Diagnosis not present

## 2019-07-05 DIAGNOSIS — L2084 Intrinsic (allergic) eczema: Secondary | ICD-10-CM | POA: Diagnosis not present

## 2019-07-29 ENCOUNTER — Ambulatory Visit: Payer: Medicare Other

## 2019-08-01 DIAGNOSIS — L2081 Atopic neurodermatitis: Secondary | ICD-10-CM | POA: Diagnosis not present

## 2019-08-14 DIAGNOSIS — L2081 Atopic neurodermatitis: Secondary | ICD-10-CM | POA: Diagnosis not present

## 2019-08-28 DIAGNOSIS — L2081 Atopic neurodermatitis: Secondary | ICD-10-CM | POA: Diagnosis not present

## 2019-09-11 DIAGNOSIS — L2084 Intrinsic (allergic) eczema: Secondary | ICD-10-CM | POA: Diagnosis not present

## 2019-09-25 DIAGNOSIS — L2089 Other atopic dermatitis: Secondary | ICD-10-CM | POA: Diagnosis not present

## 2019-10-11 DIAGNOSIS — L2084 Intrinsic (allergic) eczema: Secondary | ICD-10-CM | POA: Diagnosis not present

## 2020-01-20 ENCOUNTER — Emergency Department (HOSPITAL_COMMUNITY)
Admission: EM | Admit: 2020-01-20 | Discharge: 2020-01-20 | Disposition: A | Discharge status: Home / Self Care | Payer: Medicare Other | Attending: Emergency Medicine | Admitting: Emergency Medicine

## 2020-01-20 ENCOUNTER — Emergency Department (HOSPITAL_COMMUNITY): Payer: Medicare Other

## 2020-01-20 ENCOUNTER — Other Ambulatory Visit: Payer: Self-pay

## 2020-01-20 ENCOUNTER — Encounter (HOSPITAL_COMMUNITY): Payer: Self-pay | Admitting: Emergency Medicine

## 2020-01-20 DIAGNOSIS — Z87891 Personal history of nicotine dependence: Secondary | ICD-10-CM | POA: Insufficient documentation

## 2020-01-20 DIAGNOSIS — R079 Chest pain, unspecified: Secondary | ICD-10-CM | POA: Diagnosis not present

## 2020-01-20 DIAGNOSIS — Y939 Activity, unspecified: Secondary | ICD-10-CM | POA: Insufficient documentation

## 2020-01-20 DIAGNOSIS — Z79899 Other long term (current) drug therapy: Secondary | ICD-10-CM | POA: Diagnosis not present

## 2020-01-20 DIAGNOSIS — S3992XA Unspecified injury of lower back, initial encounter: Secondary | ICD-10-CM | POA: Diagnosis present

## 2020-01-20 DIAGNOSIS — S20212A Contusion of left front wall of thorax, initial encounter: Secondary | ICD-10-CM | POA: Diagnosis not present

## 2020-01-20 DIAGNOSIS — Z794 Long term (current) use of insulin: Secondary | ICD-10-CM | POA: Diagnosis not present

## 2020-01-20 DIAGNOSIS — E119 Type 2 diabetes mellitus without complications: Secondary | ICD-10-CM | POA: Insufficient documentation

## 2020-01-20 DIAGNOSIS — S300XXA Contusion of lower back and pelvis, initial encounter: Secondary | ICD-10-CM | POA: Insufficient documentation

## 2020-01-20 DIAGNOSIS — Y929 Unspecified place or not applicable: Secondary | ICD-10-CM | POA: Diagnosis not present

## 2020-01-20 DIAGNOSIS — Y999 Unspecified external cause status: Secondary | ICD-10-CM | POA: Insufficient documentation

## 2020-01-20 DIAGNOSIS — W108XXA Fall (on) (from) other stairs and steps, initial encounter: Secondary | ICD-10-CM | POA: Insufficient documentation

## 2020-01-20 DIAGNOSIS — S299XXA Unspecified injury of thorax, initial encounter: Secondary | ICD-10-CM | POA: Diagnosis not present

## 2020-01-20 DIAGNOSIS — I10 Essential (primary) hypertension: Secondary | ICD-10-CM | POA: Diagnosis not present

## 2020-01-20 DIAGNOSIS — Z7982 Long term (current) use of aspirin: Secondary | ICD-10-CM | POA: Diagnosis not present

## 2020-01-20 DIAGNOSIS — S20222A Contusion of left back wall of thorax, initial encounter: Secondary | ICD-10-CM

## 2020-01-20 MED ORDER — METHOCARBAMOL 500 MG PO TABS: 500.0000 mg | ORAL_TABLET | Freq: Three times a day (TID) | ORAL | 0 refills | Status: DC | PRN

## 2020-01-20 MED ORDER — DEXAMETHASONE 4 MG PO TABS
8.0000 mg | ORAL_TABLET | Freq: Once | ORAL | Status: AC
  Administered 2020-01-20: 09:00:00 8 mg via ORAL
  Filled 2020-01-20: qty 2

## 2020-01-20 MED ORDER — IBUPROFEN 200 MG PO TABS
400.0000 mg | ORAL_TABLET | Freq: Once | ORAL | Status: AC
  Administered 2020-01-20: 09:00:00 400 mg via ORAL
  Filled 2020-01-20: qty 2

## 2020-01-20 NOTE — Discharge Instructions (Addendum)
Your x-rays do not show any fractures in your back or ribs. This is likely muscular strain/contusion. I expect you to be sore for the next few days. Please take prescribed medicine as needed. Activity as tolerated.

## 2020-01-20 NOTE — ED Provider Notes (Signed)
[facility] DEPT Provider Note   CSN: [id number] Arrival date & time: 01/20/20  1791     History Chief Complaint  Patient presents with  . Fall  . Back Pain    Max [name] is a 74 y.o. male.  HPI   74 year old male with left mid back/left chest pain. He had a mechanical fall Friday. He tripped over a pair boots on his porch. He fell from the porch which she estimates was 1.5 to 2 feet off the ground. He had pain in this area which initially improved. Increased pain again the next day. Worse with movement. He has not tried taking anything for it. His breathing feels fine. No midline pain. No HA or neck pain.   Past Medical History:  Diagnosis Date  . ANXIETY, SITUATIONAL   . DEGENERATIVE DISC DISEASE   . DERMATITIS   . DIABETES MELLITUS, TYPE II, ON INSULIN, CONTROLLED   . Eosinophilia   . HYPERLIPIDEMIA   . HYPERTENSION   . LOW BACK PAIN   . PRURITUS 2011  . RENAL CELL CANCER 01/25/2005   s/p partial nephrectomy  . SEPTIC ARTHRITIS 07/2009   L ankle - MSSA  . SHOULDER PAIN, RIGHT, CHRONIC     Patient Active Problem List   Diagnosis Date Noted  . Cellulitis of right lower extremity 05/17/2017  . Iron deficiency anemia 07/23/2016  . Polyarthralgia 07/22/2016  . Acute pain of right shoulder 06/19/2016  . Olecranon bursitis of right elbow 05/22/2014  . SHOULDER PAIN, RIGHT, CHRONIC 06/10/2010  . ANXIETY, SITUATIONAL 08/04/2009  . SEPTIC ARTHRITIS 08/04/2009  . EOSINOPHILIA 05/29/2009  . Diabetes ([place]) 01/16/2009  . DERMATITIS 01/16/2009  . HYPERLIPIDEMIA 01/15/2009  . Essential hypertension 01/15/2009  . DEGENERATIVE DISC DISEASE 01/15/2009  . LOW BACK PAIN 01/15/2009  . RENAL CELL CANCER 01/25/2005    Past Surgical History:  Procedure Laterality Date  . BACK SURGERY  04/2005   Dr. Annette Stable  . LAPAROSCOPIC PARTIAL NEPHRECTOMY  01/25/05   Dr. Risa Grill  . REPAIR ANKLE LIGAMENT  04/2009   disatal fib/infx       Family History    Problem Relation Age of Onset  . Asthma Father     Social History   Tobacco Use  . Smoking status: Former Smoker    Quit date: 05/31/1994    Years since quitting: 25.6  . Smokeless tobacco: Never Used  . 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 Alcohol use: Yes    Comment: occasionally  . Drug use: No    Home Medications Prior to Admission medications   Medication Sig Start Date End Date Taking? Authorizing Provider  amLODipine (NORVASC) 10 MG tablet Take 1 tablet (10 mg total) by mouth daily. 02/02/12  Yes Rowe Clack, MD  aspirin 81 MG tablet Take 81 mg by mouth daily.     Yes [provider]  insulin glargine (LANTUS) 100 UNIT/ML injection Inject 28 Units into the skin 2 (two) times daily.    Yes [provider]  metFORMIN (GLUCOPHAGE) 1000 MG tablet Take 1,000 mg by mouth 2 (two) times daily with a meal.  12/09/10  Yes Biagio Borg, MD  BD PEN NEEDLE NANO U/F 32G X 4 MM MISC use as directed 02/17/13   Rowe Clack, MD  methocarbamol (ROBAXIN) 500 MG tablet Take 1 tablet (500 mg total) by mouth every 8 (eight) hours as needed for muscle spasms. 01/20/20   Camron Monday,  Annie Main, MD    Allergies    Patient has no known allergies.  Review of Systems   Review of Systems All systems reviewed and negative, other than as noted in HPI.  Physical Exam Updated Vital Signs BP (!) 157/81   Pulse 78   Temp 98 F (36.7 C) (Oral)   Resp 19   SpO2 98%   Physical Exam Vitals and nursing note reviewed.  Constitutional:      General: He is not in acute distress.    Appearance: He is well-developed.  HENT:     Head: Normocephalic and atraumatic.  Eyes:     General:        Right eye: No discharge.        Left eye: No discharge.     Conjunctiva/sclera: Conjunctivae normal.  Cardiovascular:     Rate and Rhythm: Normal rate and regular rhythm.     Heart sounds: Normal heart sounds. No murmur heard.  No  friction rub. No gallop.   Pulmonary:     Effort: Pulmonary effort is normal. No respiratory distress.     Breath sounds: Normal breath sounds.  Abdominal:     General: There is no distension.     Palpations: Abdomen is soft.     Tenderness: There is no abdominal tenderness.  Musculoskeletal:        General: No tenderness.       Arms:     Cervical back: Neck supple.     Comments: Mild tenderness to palpation in pictured area. No bruising or other skin changes. No crepitus. No midline spinal tenderness. Lungs clear/symmetric.  Skin:    General: Skin is warm and dry.  Neurological:     Mental Status: He is alert.  Psychiatric:        Behavior: Behavior normal.        Thought Content: Thought content normal.     ED Results / Procedures / Treatments   Labs (all labs ordered are listed, but only abnormal results are displayed) Labs Reviewed - No data to display  EKG None  Radiology DG Ribs Unilateral W/Chest Left  Result Date: 01/20/2020 CLINICAL DATA:  Fall.  Patient fell 2 days ago.  Flank pain. EXAM: LEFT RIBS AND CHEST - 3+ VIEW COMPARISON:  March 06, 2012 FINDINGS: The heart, hila, and mediastinum are normal. The left hemidiaphragm remains elevated but stable. Mild atelectasis in the left base. No pneumothorax. The lungs are otherwise clear. No definite rib fractures identified. IMPRESSION: No rib fractures noted.  No pneumothorax. Electronically Signed   By: Dorise Bullion III M.D   On: 01/20/2020 08:49    Procedures Procedures (including critical care time)  Medications Ordered in ED Medications  ibuprofen (ADVIL) tablet 400 mg (has no administration in time range)  dexamethasone (DECADRON) tablet 8 mg (has no administration in time range)    ED Course  I have reviewed the triage vital signs and the nursing notes.  Pertinent labs & imaging results that were available during my care of the patient were reviewed by me and considered in my medical decision making  (see chart for details).    MDM Rules/Calculators/A&P                          74 year old male with likely musculoskeletal contusion/sprain. No midline tenderness. No acute respiratory complaints. Imaging negative. Plan symptomatic treatment. Activity as tolerated. Outpatient follow-up as needed. Return precautions discussed.  Final Clinical Impression(s) /  ED Diagnoses Final diagnoses:  Contusion of left side of back, initial encounter    Rx / DC Orders ED Discharge Orders         Ordered    methocarbamol (ROBAXIN) 500 MG tablet  Every 8 hours PRN        01/20/20 0856           Virgel Manifold, MD 01/20/20 [phone]

## 2020-01-20 NOTE — ED Triage Notes (Signed)
Pt reports that he fall Friday when taking out his trash. C/o left back pain. Denies LOC or taking blood thinners.

## 2020-01-30 ENCOUNTER — Ambulatory Visit (INDEPENDENT_AMBULATORY_CARE_PROVIDER_SITE_OTHER): Payer: Medicare Other | Admitting: Internal Medicine

## 2020-01-30 ENCOUNTER — Encounter: Payer: Self-pay | Admitting: Internal Medicine

## 2020-01-30 ENCOUNTER — Other Ambulatory Visit: Payer: Self-pay

## 2020-01-30 DIAGNOSIS — R0781 Pleurodynia: Secondary | ICD-10-CM

## 2020-01-30 DIAGNOSIS — M7022 Olecranon bursitis, left elbow: Secondary | ICD-10-CM | POA: Diagnosis not present

## 2020-01-30 MED ORDER — MELOXICAM 7.5 MG PO TABS: 7.5000 mg | ORAL_TABLET | Freq: Every day | ORAL | 0 refills | Status: DC

## 2020-01-30 MED ORDER — METHOCARBAMOL 500 MG PO TABS: 500.0000 mg | ORAL_TABLET | Freq: Three times a day (TID) | ORAL | 0 refills | Status: DC | PRN

## 2020-01-30 MED ORDER — CEPHALEXIN 500 MG PO CAPS: 500.0000 mg | ORAL_CAPSULE | Freq: Three times a day (TID) | ORAL | 0 refills | Status: DC

## 2020-01-30 NOTE — Patient Instructions (Addendum)
For your left side - take the methocarbamol as needed ( muscle relaxer)   For the elbow - avoid any pressure on it, apply ice, take the two prescribed medication - meloxicam daily ( anti-inflammatory) and cephalexin ( antibiotic)   Please call if there is no improvement in your symptoms.

## 2020-01-30 NOTE — Assessment & Plan Note (Signed)
Acute Had mechanical fall and fell on his left side Seen in the ED, x-ray negative Likely rib contusion Much improved Okay to continue methocarbamol for few more days if needed-refilled

## 2020-01-30 NOTE — Progress Notes (Signed)
Subjective:    Patient ID: Max [name], male    DOB: [birth year], 74 y.o.   MRN: [id number]  HPI The patient is here for follow up from the ED.  ED 8/22 - went to ED for fall, back pain  He had a mechanical fall 2 days prior.  He fell off the porch and landed on his left side.  He had increased pain in left mid back the following day, worse with movement.  Rib/chest xray in the ED neg for fx. He was given ibuprofen and dexamethasone. He was dx with contusion of left side of back and discharged with methocarbamol.  The pain has improved.  He only has occasional pain - typically with coughing.  The medication has helped.   He has swelling in the left elbow.  He denies pain.  He does rest his elbow on his chair all the time.     Medications and allergies reviewed with patient and updated if appropriate.  Patient Active Problem List   Diagnosis Date Noted  . Cellulitis of right lower extremity 05/17/2017  . Iron deficiency anemia 07/23/2016  . Polyarthralgia 07/22/2016  . Acute pain of right shoulder 06/19/2016  . Olecranon bursitis of right elbow 05/22/2014  . SHOULDER PAIN, RIGHT, CHRONIC 06/10/2010  . ANXIETY, SITUATIONAL 08/04/2009  . SEPTIC ARTHRITIS 08/04/2009  . EOSINOPHILIA 05/29/2009  . Diabetes ([place]) 01/16/2009  . DERMATITIS 01/16/2009  . HYPERLIPIDEMIA 01/15/2009  . Essential hypertension 01/15/2009  . DEGENERATIVE DISC DISEASE 01/15/2009  . LOW BACK PAIN 01/15/2009  . RENAL CELL CANCER 01/25/2005    Current Outpatient Medications on File Prior to Visit  Medication Sig Dispense Refill  . amLODipine (NORVASC) 10 MG tablet Take 1 tablet (10 mg total) by mouth daily. 90 tablet 1  . aspirin 81 MG tablet Take 81 mg by mouth daily.      . BD PEN NEEDLE NANO U/F 32G X 4 MM MISC use as directed 100 each 1  . insulin glargine (LANTUS) 100 UNIT/ML injection Inject 28 Units into the skin 2 (two) times daily.     . metFORMIN (GLUCOPHAGE) 1000 MG tablet Take 1,000 mg by  mouth 2 (two) times daily with a meal.     . methocarbamol (ROBAXIN) 500 MG tablet Take 1 tablet (500 mg total) by mouth every 8 (eight) hours as needed for muscle spasms. 15 tablet 0   No current facility-administered medications on file prior to visit.    Past Medical History:  Diagnosis Date  . ANXIETY, SITUATIONAL   . DEGENERATIVE DISC DISEASE   . DERMATITIS   . DIABETES MELLITUS, TYPE II, ON INSULIN, CONTROLLED   . Eosinophilia   . HYPERLIPIDEMIA   . HYPERTENSION   . LOW BACK PAIN   . PRURITUS 2011  . RENAL CELL CANCER 01/25/2005   s/p partial nephrectomy  . SEPTIC ARTHRITIS 07/2009   L ankle - [facility]  . SHOULDER PAIN, RIGHT, CHRONIC     Past Surgical History:  Procedure Laterality Date  . BACK SURGERY  04/2005   Dr. Annette Stable  . LAPAROSCOPIC PARTIAL NEPHRECTOMY  01/25/05   Dr. Risa Grill  . REPAIR ANKLE LIGAMENT  04/2009   disatal fib/infx    Social History   Socioeconomic History  . Marital status: Married    Spouse name: Not on file  . Number of children: Not on file  . Years of education: Not on file  . Highest education level: Not on file  Occupational History  . Not  on file  Tobacco Use  . Smoking status: Former Smoker    Quit date: 05/31/1994    Years since quitting: 25.6  . Smokeless tobacco: Never Used  . 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 Alcohol use: Yes    Comment: occasionally  . Drug use: No  . Sexual activity: Not on file  Other Topics Concern  . Not on file  Social History Narrative  . Not on file   Social Determinants of Health   Financial Resource Strain:   . Difficulty of Paying Living Expenses: Not on file  Food Insecurity:   . Worried About Charity fundraiser in the Last Year: Not on file  . Ran Out of Food in the Last Year: Not on file  Transportation Needs:   . Lack of Transportation (Medical): Not on file  . Lack of Transportation (Non-Medical): Not on file  Physical  Activity:   . Days of Exercise per Week: Not on file  . Minutes of Exercise per Session: Not on file  Stress:   . Feeling of Stress : Not on file  Social Connections:   . Frequency of Communication with Friends and Family: Not on file  . Frequency of Social Gatherings with Friends and Family: Not on file  . Attends Religious Services: Not on file  . Active Member of Clubs or Organizations: Not on file  . Attends Archivist Meetings: Not on file  . Marital Status: Not on file    Family History  Problem Relation Age of Onset  . Asthma Father     Review of Systems  Constitutional: Negative for chills and fever.  Respiratory: Negative for cough, shortness of breath and wheezing.   Cardiovascular: Negative for chest pain.  Neurological: Negative for light-headedness and headaches.       Objective:   Vitals:   01/30/20 1108  BP: (!) 142/80  Pulse: 76  Temp: 98.4 F (36.9 C)  SpO2: 97%   BP Readings from Last 3 Encounters:  01/30/20 (!) 142/80  01/20/20 (!) 145/78  05/11/19 (!) 160/80   Wt Readings from Last 3 Encounters:  01/30/20 167 lb 6.4 oz (75.9 kg)  05/11/19 170 lb (77.1 kg)  01/31/18 170 lb 12.8 oz (77.5 kg)   Body mass index is 26.22 kg/m.   Physical Exam    Constitutional: Appears well-developed and well-nourished. No distress.  HENT:  Head: Normocephalic and atraumatic.  Neck: Neck supple. No tracheal deviation present. No thyromegaly present.  No cervical lymphadenopathy Cardiovascular: Normal rate, regular rhythm and normal heart sounds.   No murmur heard.   No edema Pulmonary/Chest: No tenderness with palpation left side of ribs.  Effort normal and breath sounds normal. No respiratory distress. No has no wheezes. No rales. Musculoskeletal: Left elbow with swollen bursa, slight warmth, nontender.  Overlying scab Skin: Skin is warm and dry. Not diaphoretic.  Psychiatric: Normal mood and affect. Behavior is normal.      Assessment &  Plan:    See Problem List for Assessment and Plan of chronic medical problems.    This visit occurred during the SARS-CoV-2 public health emergency.  Safety protocols were in place, including screening questions prior to the visit, additional usage of staff PPE, and extensive cleaning of exam room while observing appropriate contact time as indicated for disinfecting solutions.

## 2020-01-30 NOTE — Assessment & Plan Note (Signed)
Acute Significant swelling, slight warmth to touch, but no pain.  There is an overlying scab Stressed applying no pressure on the elbows Can apply ice Start meloxicam 7.5 mg daily-short-term.  Advised to take with food Start Keflex 500 mg 3 times daily x10 days He would like to avoid having this drained if not needed Advised him to call if there is no improvement or if he decides he would like this drained

## 2020-02-16 ENCOUNTER — Other Ambulatory Visit: Payer: Self-pay | Admitting: Internal Medicine

## 2020-06-19 ENCOUNTER — Ambulatory Visit: Payer: Medicare Other | Admitting: Family Medicine

## 2020-06-19 ENCOUNTER — Encounter: Payer: Self-pay | Admitting: Family Medicine

## 2020-06-19 ENCOUNTER — Other Ambulatory Visit: Payer: Self-pay

## 2020-06-19 DIAGNOSIS — R200 Anesthesia of skin: Secondary | ICD-10-CM

## 2020-06-19 DIAGNOSIS — R202 Paresthesia of skin: Secondary | ICD-10-CM

## 2020-06-19 NOTE — Progress Notes (Signed)
Max Max [name] Phone: [phone] Subjective:   Max Max [name], am serving as a scribe for Dr. Hulan Saas. This visit occurred during the SARS-CoV-2 public health emergency.  Safety protocols were in place, including screening questions prior to the visit, additional usage of staff PPE, and extensive cleaning of exam room while observing appropriate contact time as indicated for disinfecting solutions.   I'm seeing this patient by the request  of:  Binnie Rail, MD  CC: Right hand pain and numbness  FTD:[id number]  Max Max [name] is a 75 y.o. male coming in with complaint of right hand numbness that occurred 2 weeks ago. Max [name] pattern to symptoms. Patient states that he was on some pills a few years ago that helped him. Notices numbness when he is riding his motorcycle.  Patient states that the only time that it is happened.  Patient is going on another ride and wants to be concerning that it might come back.      Past Medical History:  Diagnosis Date   ANXIETY, SITUATIONAL    DEGENERATIVE DISC DISEASE    DERMATITIS    DIABETES MELLITUS, TYPE II, ON INSULIN, CONTROLLED    Eosinophilia    HYPERLIPIDEMIA    HYPERTENSION    LOW BACK PAIN    PRURITUS 2011   RENAL CELL CANCER 01/25/2005   s/p partial nephrectomy   SEPTIC ARTHRITIS 07/2009   L ankle - [facility]   SHOULDER PAIN, RIGHT, CHRONIC    Past Surgical History:  Procedure Laterality Date   BACK SURGERY  04/2005   Dr. Annette Stable   LAPAROSCOPIC PARTIAL NEPHRECTOMY  01/25/05   Dr. 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, English as a foreign language  Substance and Sexual Activity   Alcohol use: Yes    Comment: occasionally   Drug use: Max [name]   Sexual activity: Not on file  Other Topics Concern   Not on file  Social History Narrative   Not on file   Social Determinants of Health   Financial Resource Strain: Not on file  Food Insecurity: Not on file  Transportation Needs: Not on file  Physical Activity: Not on file  Stress: Not on file  Social Connections: Not on file   Max [name] Known Allergies Family History  Problem Relation Age of Onset   Asthma Father     Current Outpatient Medications (Endocrine & Metabolic):    insulin glargine (LANTUS) 100 UNIT/ML injection, Inject 28 Units into the skin 2 (two) times daily.    metFORMIN (GLUCOPHAGE) 1000 MG tablet, Take 1,000 mg by mouth 2 (two) times daily with a meal.   Current Outpatient Medications (Cardiovascular):    amLODipine (NORVASC) 10 MG tablet, Take 1 tablet (10 mg total) by mouth daily.   Current Outpatient Medications (Analgesics):    aspirin 81 MG tablet, Take 81 mg by mouth daily.   meloxicam (MOBIC) 7.5 MG tablet, TAKE 1  TABLET(7.5 MG) BY MOUTH DAILY WITH FOOD   Current Outpatient Medications (Other):    BD PEN NEEDLE NANO U/F 32G X 4 MM MISC, use as directed   cephALEXin (KEFLEX) 500 MG capsule, Take 1 capsule (500 mg total) by mouth 3 (three) times daily.   DUPIXENT 300 MG/2ML prefilled syringe, Inject into the skin every 14 (fourteen) days.   methocarbamol (ROBAXIN) 500 MG tablet, Take 1 tablet (500 mg total) by mouth every 8 (eight) hours as needed for muscle spasms.   Reviewed prior external information including notes and imaging from  primary care provider As well as notes that were available from care everywhere and other healthcare systems.  Past medical history, social, surgical and family history  all reviewed in electronic medical record.  Max [name] pertanent information unless stated regarding to the chief complaint.   Review of Systems:  Max [name] headache, visual changes, nausea, vomiting, diarrhea, constipation, dizziness, abdominal pain, skin rash, fevers, chills, night sweats, weight loss, swollen lymph nodes, body aches, joint swelling, chest pain, shortness of breath, mood changes. POSITIVE muscle aches  Objective  Blood pressure (!) 142/90, pulse 63, height 5\' 7"  (1.702 m), weight 164 lb (74.4 kg), SpO2 98 %.   General: Max [name] apparent distress alert and oriented x3 mood and affect normal, dressed appropriately.  HEENT: Pupils equal, extraocular movements intact  Respiratory: Patient's speak in full sentences and does not appear short of breath  Cardiovascular: Max [name] lower extremity edema, non tender, Max [name] erythema  Gait normal with good balance and coordination.  MSK: Right hand exam shows the patient does have some mild arthritic changes.  The patient has a negative Tinel's today.  Good range of motion.  Good grip strength.  Max [name] thenar eminence wasting.  Full range of motion of the wrist noted.  Good capillary refill.    Impression and Recommendations:     The above documentation has been reviewed and is accurate and complete Lyndal Pulley, DO

## 2020-06-19 NOTE — Patient Instructions (Signed)
  Arnica lotion Throttle rocker Exercises If it comes back let me know and we will recheck

## 2020-06-19 NOTE — Assessment & Plan Note (Signed)
Patient had 1 episode of hand numbness on the right side when he was only riding his motorcycle.  When he discontinued it seemed to go well.  Did make him nervous.  Patient initially saw me previously and had responded well to gabapentin at this point because it was only 1 episode we will hold on doing the prescription.  Given home exercises discussed over-the-counter topical anti-inflammatory that can be helpful.  Follow-up again more as needed.

## 2020-06-20 ENCOUNTER — Encounter: Payer: Self-pay | Admitting: Family Medicine

## 2020-07-01 ENCOUNTER — Ambulatory Visit: Payer: Medicare Other | Admitting: Family Medicine

## 2020-09-13 ENCOUNTER — Emergency Department (HOSPITAL_COMMUNITY)
Admission: EM | Admit: 2020-09-13 | Discharge: 2020-09-13 | Disposition: A | Discharge status: Home / Self Care | Payer: Medicare Other | Attending: Emergency Medicine | Admitting: Emergency Medicine

## 2020-09-13 ENCOUNTER — Other Ambulatory Visit: Payer: Self-pay

## 2020-09-13 DIAGNOSIS — Z87891 Personal history of nicotine dependence: Secondary | ICD-10-CM | POA: Diagnosis not present

## 2020-09-13 DIAGNOSIS — T7840XA Allergy, unspecified, initial encounter: Secondary | ICD-10-CM | POA: Diagnosis not present

## 2020-09-13 DIAGNOSIS — I10 Essential (primary) hypertension: Secondary | ICD-10-CM | POA: Diagnosis not present

## 2020-09-13 DIAGNOSIS — L299 Pruritus, unspecified: Secondary | ICD-10-CM | POA: Insufficient documentation

## 2020-09-13 DIAGNOSIS — Z7984 Long term (current) use of oral hypoglycemic drugs: Secondary | ICD-10-CM | POA: Diagnosis not present

## 2020-09-13 DIAGNOSIS — L539 Erythematous condition, unspecified: Secondary | ICD-10-CM | POA: Diagnosis not present

## 2020-09-13 DIAGNOSIS — Z85528 Personal history of other malignant neoplasm of kidney: Secondary | ICD-10-CM | POA: Diagnosis not present

## 2020-09-13 DIAGNOSIS — E119 Type 2 diabetes mellitus without complications: Secondary | ICD-10-CM | POA: Diagnosis not present

## 2020-09-13 DIAGNOSIS — H5789 Other specified disorders of eye and adnexa: Secondary | ICD-10-CM | POA: Diagnosis not present

## 2020-09-13 DIAGNOSIS — Z7982 Long term (current) use of aspirin: Secondary | ICD-10-CM | POA: Diagnosis not present

## 2020-09-13 DIAGNOSIS — Z794 Long term (current) use of insulin: Secondary | ICD-10-CM | POA: Diagnosis not present

## 2020-09-13 DIAGNOSIS — Z79899 Other long term (current) drug therapy: Secondary | ICD-10-CM | POA: Diagnosis not present

## 2020-09-13 MED ORDER — DIPHENHYDRAMINE HCL 25 MG PO CAPS
50.0000 mg | ORAL_CAPSULE | Freq: Once | ORAL | Status: AC
Start: 2020-09-13 — End: 2020-09-13
  Administered 2020-09-13: 50 mg via ORAL
  Filled 2020-09-13: qty 2

## 2020-09-13 MED ORDER — PREDNISONE 20 MG PO TABS
60.0000 mg | ORAL_TABLET | Freq: Once | ORAL | Status: AC
  Administered 2020-09-13: 60 mg via ORAL
  Filled 2020-09-13: qty 3

## 2020-09-13 MED ORDER — PREDNISONE 20 MG PO TABS: 60.0000 mg | ORAL_TABLET | Freq: Every day | ORAL | 0 refills | Status: AC

## 2020-09-13 NOTE — Discharge Instructions (Addendum)
Please take Benadryl 25 mg every 8 hours for facial swelling and/or itching.

## 2020-09-13 NOTE — ED Triage Notes (Addendum)
Patient reports he woke up this morning with both eyes swollen. Says he thinks it is due to pollen. Patient denies pain but says his head itches. Patient has taken nothing for relief. Denies difficulty breathing and does not appear to be in any distress. Bilateral eyelids visibly swollen.

## 2020-09-13 NOTE — ED Provider Notes (Signed)
[facility] DEPT Provider Note   CSN: [id number] Arrival date & time: 09/13/20  0746     History Chief Complaint  Patient presents with  . Facial Swelling    Max [name] is a 75 y.o. male.  The history is provided by the patient.  Eye Problem Location:  Both eyes Quality:  Burning Severity:  Severe Onset quality:  Sudden Duration: awoke like this. Timing:  Constant Progression:  Worsening Chronicity:  New Context comment:  Worked outside, exposed to pollen yesterday Relieved by:  Nothing Worsened by:  Nothing Ineffective treatments:  None tried Associated symptoms: itching   Associated symptoms: no crusting, no decreased vision, no double vision and no vomiting        Past Medical History:  Diagnosis Date  . ANXIETY, SITUATIONAL   . DEGENERATIVE DISC DISEASE   . DERMATITIS   . DIABETES MELLITUS, TYPE II, ON INSULIN, CONTROLLED   . Eosinophilia   . HYPERLIPIDEMIA   . HYPERTENSION   . LOW BACK PAIN   . PRURITUS 2011  . RENAL CELL CANCER 01/25/2005   s/p partial nephrectomy  . SEPTIC ARTHRITIS 07/2009   L ankle - [facility]  . SHOULDER PAIN, RIGHT, CHRONIC     Patient Active Problem List   Diagnosis Date Noted  . Numbness and tingling in right hand 06/19/2020  . Rib pain on left side 01/30/2020  . Olecranon bursitis, left elbow 01/30/2020  . Cellulitis of right lower extremity 05/17/2017  . Iron deficiency anemia 07/23/2016  . Polyarthralgia 07/22/2016  . Acute pain of right shoulder 06/19/2016  . Olecranon bursitis of right elbow 05/22/2014  . SHOULDER PAIN, RIGHT, CHRONIC 06/10/2010  . ANXIETY, SITUATIONAL 08/04/2009  . SEPTIC ARTHRITIS 08/04/2009  . EOSINOPHILIA 05/29/2009  . Diabetes ([place]) 01/16/2009  . DERMATITIS 01/16/2009  . HYPERLIPIDEMIA 01/15/2009  . Essential hypertension 01/15/2009  . DEGENERATIVE DISC DISEASE 01/15/2009  . LOW BACK PAIN 01/15/2009  . RENAL CELL CANCER 01/25/2005    Past Surgical History:   Procedure Laterality Date  . BACK SURGERY  04/2005   Dr. Annette Stable  . LAPAROSCOPIC PARTIAL NEPHRECTOMY  01/25/05   Dr. Risa Grill  . REPAIR ANKLE LIGAMENT  04/2009   disatal fib/infx       Family History  Problem Relation Age of Onset  . Asthma Father     Social History   Tobacco Use  . Smoking status: Former Smoker    Quit date: 05/31/1994    Years since quitting: 26.3  . Smokeless tobacco: Never Used  . 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 Alcohol use: Yes    Comment: occasionally  . Drug use: No    Home Medications Prior to Admission medications   Medication Sig Start Date End Date Taking? Authorizing Provider  amLODipine (NORVASC) 10 MG tablet Take 1 tablet (10 mg total) by mouth daily. 02/02/12   Rowe Clack, MD  aspirin 81 MG tablet Take 81 mg by mouth daily.    [provider]  BD PEN NEEDLE NANO U/F 32G X 4 MM MISC use as directed 02/17/13   Rowe Clack, MD  cephALEXin (KEFLEX) 500 MG capsule Take 1 capsule (500 mg total) by mouth 3 (three) times daily. 01/30/20   Binnie Rail, MD  DUPIXENT 300 MG/2ML prefilled syringe Inject into the skin every 14 (fourteen) days. 10/15/19   [provider]  insulin glargine (LANTUS) 100 UNIT/ML injection Inject 28 Units into the  skin 2 (two) times daily.     [provider]  meloxicam (MOBIC) 7.5 MG tablet TAKE 1 TABLET(7.5 MG) BY MOUTH DAILY WITH FOOD 02/18/20   Binnie Rail, MD  metFORMIN (GLUCOPHAGE) 1000 MG tablet Take 1,000 mg by mouth 2 (two) times daily with a meal.  12/09/10   Biagio Borg, MD  methocarbamol (ROBAXIN) 500 MG tablet Take 1 tablet (500 mg total) by mouth every 8 (eight) hours as needed for muscle spasms. 01/30/20   Binnie Rail, MD    Allergies    Patient has no known allergies.  Review of Systems   Review of Systems  Constitutional: Negative for chills and fever.  HENT: Negative for ear pain and sore throat.   Eyes:  Positive for itching. Negative for double vision, pain and visual disturbance.  Respiratory: Negative for cough and shortness of breath.   Cardiovascular: Negative for chest pain and palpitations.  Gastrointestinal: Negative for abdominal pain and vomiting.  Genitourinary: Negative for dysuria and hematuria.  Musculoskeletal: Negative for arthralgias and back pain.  Skin: Negative for color change and rash.       Scalp itching  Neurological: Negative for seizures and syncope.  All other systems reviewed and are negative.   Physical Exam Updated Vital Signs BP 131/75 (BP Location: Right Arm)   Pulse 72   Temp 98.9 F (37.2 C) (Oral)   Resp 16   Ht 5\' 10"  (1.778 m)   Wt 78 kg   SpO2 97%   BMI 24.68 kg/m   Physical Exam Vitals and nursing note reviewed.  Constitutional:      Appearance: Normal appearance.  HENT:     Head: Normocephalic and atraumatic.     Mouth/Throat:     Mouth: Mucous membranes are moist.     Pharynx: Oropharynx is clear. No oropharyngeal exudate or posterior oropharyngeal erythema.  Eyes:     Conjunctiva/sclera: Conjunctivae normal.     Comments: Patient has erythema and swelling of bilateral upper and lower eyelids.  The eyes themselves are within normal limits without discharge or erythema.  Pulmonary:     Effort: Pulmonary effort is normal. No respiratory distress.  Musculoskeletal:        General: No deformity. Normal range of motion.     Cervical back: Normal range of motion.  Skin:    General: Skin is warm and dry.  Neurological:     General: No focal deficit present.     Mental Status: He is alert and oriented to person, place, and time. Mental status is at baseline.  Psychiatric:        Mood and Affect: Mood normal.     ED Results / Procedures / Treatments   Labs (all labs ordered are listed, but only abnormal results are displayed) Labs Reviewed - No data to display  EKG None  Radiology No results found.  Procedures Procedures    Medications Ordered in ED Medications  predniSONE (DELTASONE) tablet 60 mg (has no administration in time range)  diphenhydrAMINE (BENADRYL) capsule 50 mg (has no administration in time range)    ED Course  I have reviewed the triage vital signs and the nursing notes.  Pertinent labs & imaging results that were available during my care of the patient were reviewed by me and considered in my medical decision making (see chart for details).    MDM Rules/Calculators/A&P  Shandy Priola presents with bilateral eyelid swelling and scalp itching that appears consistent with some sort of allergic reaction.  No evidence of anaphylaxis or airway/mucosal involvement.  He was given oral meds with some very mild improvement but counseled that it could take some time for symptoms to gradually resolve.  I will place him on a short course of prednisone, and I have recommended that he continue to take Benadryl.  Symptoms do not appear infectious in etiology. Final Clinical Impression(s) / ED Diagnoses Final diagnoses:  Allergic reaction, initial encounter    Rx / DC Orders ED Discharge Orders         Ordered    predniSONE (DELTASONE) 20 MG tablet  Daily        09/13/20 0929           Arnaldo Natal, MD 09/13/20 1028

## 2021-02-18 ENCOUNTER — Ambulatory Visit (INDEPENDENT_AMBULATORY_CARE_PROVIDER_SITE_OTHER): Payer: Medicare Other | Admitting: Internal Medicine

## 2021-02-18 ENCOUNTER — Other Ambulatory Visit: Payer: Self-pay

## 2021-02-18 ENCOUNTER — Encounter: Payer: Self-pay | Admitting: Internal Medicine

## 2021-02-18 VITALS — BP 160/80 | HR 85 | Temp 98.1°F | Wt 168.0 lb

## 2021-02-18 DIAGNOSIS — H01119 Allergic dermatitis of unspecified eye, unspecified eyelid: Secondary | ICD-10-CM | POA: Diagnosis not present

## 2021-02-18 NOTE — Progress Notes (Signed)
Acute office Visit     This visit occurred during the SARS-CoV-2 public health emergency.  Safety protocols were in place, including screening questions prior to the visit, additional usage of staff PPE, and extensive cleaning of exam room while observing appropriate contact time as indicated for disinfecting solutions.    CC/Reason for Visit: "Puffy eyelids"  HPI: Max [name] is a 75 y.o. male who is coming in today for the above mentioned reasons.  He applied some calamine spray yesterday to his arms after being in contact with poison ivy and developing a rash.  Unfortunately he then laid his head down on his forearms and immediately had itching in his eyes.  He states he jumped in the shower and washed it out with water but when he woke up this morning both of his eyes were puffy over the upper eyelid, left more so than the right.  He decided to come in today for evaluation.  Past Medical/Surgical History: Past Medical History:  Diagnosis Date   ANXIETY, SITUATIONAL    DEGENERATIVE DISC DISEASE    DERMATITIS    DIABETES MELLITUS, TYPE II, ON INSULIN, CONTROLLED    Eosinophilia    HYPERLIPIDEMIA    HYPERTENSION    LOW BACK PAIN    PRURITUS 2011   RENAL CELL CANCER 01/25/2005   s/p partial nephrectomy   SEPTIC ARTHRITIS 07/2009   L ankle - [facility]   SHOULDER PAIN, RIGHT, CHRONIC     Past Surgical History:  Procedure Laterality Date   BACK SURGERY  04/2005   Dr. Annette Stable   LAPAROSCOPIC PARTIAL NEPHRECTOMY  01/25/05   Dr. Risa Grill   REPAIR ANKLE LIGAMENT  04/2009   disatal fib/infx    Social History:  reports that he quit smoking about 26 years ago. His smoking use included cigarettes. He has never used smokeless tobacco. He reports current alcohol use. He reports that he does not use drugs.  Allergies: No Known Allergies  Family History:  Family History  Problem Relation Age of Onset   Asthma Father      Current Outpatient Medications:    amLODipine (NORVASC) 10  MG tablet, Take 1 tablet (10 mg total) by mouth daily., Disp: 90 tablet, Rfl: 1   aspirin 81 MG tablet, Take 81 mg by mouth daily., Disp: , Rfl:    BD PEN NEEDLE NANO U/F 32G X 4 MM MISC, use as directed, Disp: 100 each, Rfl: 1   DUPIXENT 300 MG/2ML prefilled syringe, Inject into the skin every 14 (fourteen) days., Disp: , Rfl:    insulin glargine (LANTUS) 100 UNIT/ML injection, Inject 28 Units into the skin 2 (two) times daily. , Disp: , Rfl:    meloxicam (MOBIC) 7.5 MG tablet, TAKE 1 TABLET(7.5 MG) BY MOUTH DAILY WITH FOOD, Disp: 20 tablet, Rfl: 0   metFORMIN (GLUCOPHAGE) 1000 MG tablet, Take 1,000 mg by mouth 2 (two) times daily with a meal. , Disp: , Rfl:    methocarbamol (ROBAXIN) 500 MG tablet, Take 1 tablet (500 mg total) by mouth every 8 (eight) hours as needed for muscle spasms., Disp: 10 tablet, Rfl: 0  Review of Systems:  Constitutional: Denies fever, chills, diaphoresis, appetite change and fatigue.  HEENT: Denies photophobia, , redness, hearing loss, ear pain, congestion, sore throat, rhinorrhea, sneezing, mouth sores, trouble swallowing, neck pain, neck stiffness and tinnitus.   Respiratory: Denies SOB, DOE, cough, chest tightness,  and wheezing.   Cardiovascular: Denies chest pain, palpitations and leg swelling.  Gastrointestinal: Denies  nausea, vomiting, abdominal pain, diarrhea, constipation, blood in stool and abdominal distention.  Genitourinary: Denies dysuria, urgency, frequency, hematuria, flank pain and difficulty urinating.  Endocrine: Denies: hot or cold intolerance, sweats, changes in hair or nails, polyuria, polydipsia. Musculoskeletal: Denies myalgias, back pain, joint swelling, arthralgias and gait problem.  Skin: Denies pallor, rash and wound.  Neurological: Denies dizziness, seizures, syncope, weakness, light-headedness, numbness and headaches.  Hematological: Denies adenopathy. Easy bruising, personal or family bleeding history  Psychiatric/Behavioral: Denies  suicidal ideation, mood changes, confusion, nervousness, sleep disturbance and agitation    Physical Exam: Vitals:   02/18/21 1024  BP: (!) 160/80  Pulse: 85  Temp: 98.1 F (36.7 C)  TempSrc: Oral  SpO2: 98%  Weight: 168 lb (76.2 kg)    Body mass index is 24.11 kg/m.   Constitutional: NAD, calm, comfortable Eyes: PERRL, no scleral or conjunctival injection, both eyelids are edematous the left more so than the right. ENMT: Mucous membranes are moist.  Neurologic: Grossly intact and nonfocal Psychiatric: Normal judgment and insight. Alert and oriented x 3. Normal mood.    Impression and Plan:  Contact dermatitis of eyelid, unspecified laterality -I have irrigated both eyes today with sterile saline, no foreign body identified. -He will do cold compresses, do not believe medications are necessary. -He knows to follow-up with [place] if issues persist after 5 to 6 days.     Lelon Frohlich, MD [facility]

## 2021-04-21 LAB — LIPID PANEL
Cholesterol: 228 — AB (ref 0–200)
HDL: 91 — AB (ref 35–70)
LDL Cholesterol: 105
Triglycerides: 162 — AB (ref 40–160)

## 2021-05-12 DIAGNOSIS — L578 Other skin changes due to chronic exposure to nonionizing radiation: Secondary | ICD-10-CM | POA: Diagnosis not present

## 2021-05-12 DIAGNOSIS — L2089 Other atopic dermatitis: Secondary | ICD-10-CM | POA: Diagnosis not present

## 2021-06-07 NOTE — Progress Notes (Signed)
Subjective:    Patient ID: Max [name], male    DOB: [birth year], 76 y.o.   MRN: [id number]  This visit occurred during the SARS-CoV-2 public health emergency.  Safety protocols were in place, including screening questions prior to the visit, additional usage of staff PPE, and extensive cleaning of exam room while observing appropriate contact time as indicated for disinfecting solutions.     HPI The patient is here for follow up of their chronic medical problems, including htn, DM, hld  He does go to the [place] regularly, but was advised that he should also be following up with me next Doing it on a regular basis.  He did have blood work done in the last few months at the [place] and brought a copy  The [place] does prescribe this medication and he is taking it as prescribed.  He used gain detergent a few days ago and started getting swelling in his eyes Sunday.  He also got itching throughout his body.  He always uses the same detergent and feels this was likely an allergic reaction to the new detergent, which she has stopped.  Has has used warm compresses on his eyes.  The itching has gotten better, but he still has swelling around his eyes.     He is not exercising regularly, but plans on starting to walk    Medications and allergies reviewed with patient and updated if appropriate.  Patient Active Problem List   Diagnosis Date Noted   Numbness and tingling in right hand 06/19/2020   Olecranon bursitis, left elbow 01/30/2020   Cellulitis of right lower extremity 05/17/2017   Iron deficiency anemia 07/23/2016   Polyarthralgia 07/22/2016   Olecranon bursitis of right elbow 05/22/2014   SHOULDER PAIN, RIGHT, CHRONIC 06/10/2010   ANXIETY, SITUATIONAL 08/04/2009   SEPTIC ARTHRITIS 08/04/2009   EOSINOPHILIA 05/29/2009   Diabetes ([place]) 01/16/2009   DERMATITIS 01/16/2009   Hyperlipidemia 01/15/2009   Essential hypertension 01/15/2009   DEGENERATIVE DISC DISEASE 01/15/2009   LOW BACK  PAIN 01/15/2009   RENAL CELL CANCER 01/25/2005    Current Outpatient Medications on File Prior to Visit  Medication Sig Dispense Refill   albuterol (VENTOLIN HFA) 108 (90 Base) MCG/ACT inhaler INHALE 2 PUFFS BY MOUTH FOUR TIMES A DAY AS NEEDED     amLODipine (NORVASC) 10 MG tablet Take 1 tablet (10 mg total) by mouth daily. 90 tablet 1   aspirin 81 MG chewable tablet CHEW ONE TABLET BY MOUTH EVERY DAY     BD PEN NEEDLE NANO U/F 32G X 4 MM MISC use as directed 100 each 1   DUPIXENT 300 MG/2ML prefilled syringe Inject into the skin every 14 (fourteen) days.     FLUoxetine (PROZAC) 20 MG capsule Take 20 mg by mouth daily at 6 (six) AM.     insulin glargine (LANTUS) 100 UNIT/ML injection Inject 28 Units into the skin 2 (two) times daily.      lisinopril-hydrochlorothiazide (ZESTORETIC) 10-12.5 MG tablet TAKE 1 TABLET BY MOUTH DAILY FOR BLOOD PRESSURE AND KIDNEY PROTECTION     metFORMIN (GLUCOPHAGE) 1000 MG tablet Take 1,000 mg by mouth 2 (two) times daily with a meal.      meloxicam (MOBIC) 7.5 MG tablet TAKE 1 TABLET(7.5 MG) BY MOUTH DAILY WITH FOOD (Patient not taking: Reported on 06/10/2021) 20 tablet 0   No current facility-administered medications on file prior to visit.    Past Medical History:  Diagnosis Date   ANXIETY, SITUATIONAL  DEGENERATIVE DISC DISEASE    DERMATITIS    DIABETES MELLITUS, TYPE II, ON INSULIN, CONTROLLED    Eosinophilia    HYPERLIPIDEMIA    HYPERTENSION    LOW BACK PAIN    PRURITUS 2011   RENAL CELL CANCER 01/25/2005   s/p partial nephrectomy   SEPTIC ARTHRITIS 07/2009   L ankle - MSSA   SHOULDER PAIN, RIGHT, CHRONIC     Past Surgical History:  Procedure Laterality Date   BACK SURGERY  04/2005   Dr. Annette Stable   LAPAROSCOPIC PARTIAL NEPHRECTOMY  01/25/05   Dr. Risa Grill   REPAIR ANKLE LIGAMENT  04/2009   disatal fib/infx    Social History   Socioeconomic History   Marital status: Widowed    Spouse name: Not on file   Number of children: Not on file    Years of education: Not on file   Highest education level: Not on file  Occupational History   Not on file  Tobacco Use   Smoking status: Former    Types: Cigarettes    Quit date: 05/31/1994    Years since quitting: 27.0   Smokeless tobacco: Never   Tobacco comments:    Married, lives with wife. Former Teacher, English as a foreign language  Substance and Sexual Activity   Alcohol use: Yes    Comment: occasionally   Drug use: No   Sexual activity: Not on file  Other Topics Concern   Not on file  Social History Narrative   Not on file   Social Determinants of Health   Financial Resource Strain: Not on file  Food Insecurity: Not on file  Transportation Needs: Not on file  Physical Activity: Not on file  Stress: Not on file  Social Connections: Not on file    Family History  Problem Relation Age of Onset   Asthma Father     Review of Systems  Constitutional:  Negative for fever.  Respiratory:  Positive for cough. Negative for shortness of breath and wheezing.   Cardiovascular:  Negative for chest pain, palpitations and leg swelling.  Gastrointestinal:  Negative for abdominal pain and nausea.       No gerd  Musculoskeletal:  Positive for arthralgias (occ, mild). Negative for back pain.  Neurological:  Negative for dizziness, light-headedness, numbness (tingling right hand - intermittent) and headaches.      Objective:   Vitals:   06/10/21 1034  BP: (!) 160/72  Pulse: 82  Temp: 98.5 F (36.9 C)  SpO2: 97%   BP Readings from Last 3 Encounters:  06/10/21 (!) 160/72  02/18/21 (!) 160/80  09/13/20 (!) 139/93   Wt Readings from Last 3 Encounters:  06/10/21 167 lb 12.8 oz (76.1 kg)  02/18/21 168 lb (76.2 kg)  09/13/20 172 lb (78 kg)   Body mass index is 24.08 kg/m.   Physical Exam    Constitutional: Appears well-developed and well-nourished. No distress.  HENT:  Head: Normocephalic and atraumatic. Eyes: Bilateral, L>R upper eyelid swelling, mild bilateral lower eyelid swelling,  no conjunctival erythema, vision at baseline Neck: Neck supple. No tracheal deviation present. No thyromegaly present.  No cervical lymphadenopathy Cardiovascular: Normal rate, regular rhythm and normal heart sounds.   No murmur heard. No carotid bruit .  No edema Pulmonary/Chest: Effort normal and breath sounds normal. No respiratory distress. No has no wheezes. No rales.  Skin: Skin is warm and dry. Not diaphoretic.  Psychiatric: Normal mood and affect. Behavior is normal.      Assessment & Plan:  I spent 20 minutes dedicated to the care of this patient on the date of this encounter including review of recent labs, [facility] notes, obtaining history, communicating with the patient, ordering and documenting clinical information in the EHR   See Problem List for Assessment and Plan of chronic medical problems.

## 2021-06-10 ENCOUNTER — Ambulatory Visit (INDEPENDENT_AMBULATORY_CARE_PROVIDER_SITE_OTHER): Payer: Medicare Other | Admitting: Internal Medicine

## 2021-06-10 ENCOUNTER — Encounter: Payer: Self-pay | Admitting: Internal Medicine

## 2021-06-10 ENCOUNTER — Other Ambulatory Visit: Payer: Self-pay

## 2021-06-10 VITALS — BP 160/72 | HR 82 | Temp 98.5°F | Ht 70.0 in | Wt 167.8 lb

## 2021-06-10 DIAGNOSIS — I10 Essential (primary) hypertension: Secondary | ICD-10-CM

## 2021-06-10 DIAGNOSIS — T7849XA Other allergy, initial encounter: Secondary | ICD-10-CM | POA: Diagnosis not present

## 2021-06-10 DIAGNOSIS — E119 Type 2 diabetes mellitus without complications: Secondary | ICD-10-CM | POA: Diagnosis not present

## 2021-06-10 DIAGNOSIS — E782 Mixed hyperlipidemia: Secondary | ICD-10-CM | POA: Diagnosis not present

## 2021-06-10 DIAGNOSIS — Z794 Long term (current) use of insulin: Secondary | ICD-10-CM | POA: Diagnosis not present

## 2021-06-10 MED ORDER — LISINOPRIL 10 MG PO TABS: 10.0000 mg | ORAL_TABLET | Freq: Every day | ORAL | 3 refills | Status: AC

## 2021-06-10 NOTE — Assessment & Plan Note (Addendum)
Chronic Regular exercise and healthy diet encouraged LDL 105-blood work reviewed from [place] Not currently on a statin-did not tolerate a statin in the past-the [place] did advise the patient to consider retrying a statin-we will discuss further at his next appointment

## 2021-06-10 NOTE — Assessment & Plan Note (Addendum)
[facility] Taking Lantus 28 units once daily, metformin 100 mg twice daily A1c 03/2021 was 8.2%-his Lantus dose was increased at that time and the goal for him is less than 8%

## 2021-06-10 NOTE — Patient Instructions (Addendum)
° ° ° °  Medications changes include :   try zyrtec and cool compresses for your eye swelling    Please followup in 6 months

## 2021-06-10 NOTE — Assessment & Plan Note (Addendum)
Chronic Blood pressure elevated here, but he states typically it is better controlled CMP done at the [facility] November 2022 his kidney function is slightly decreased-GFR 49 Continue amlodipine 10 mg daily, lisinopril 10 mg daily

## 2021-06-10 NOTE — Assessment & Plan Note (Signed)
Acute Used gain detergent and had whole body itchy and swelling in his eye lids Itching has resolved Still has some eyelid swelling - advised cool compresses and start zyrtec for several days Call if no improvement

## 2021-06-30 DIAGNOSIS — L578 Other skin changes due to chronic exposure to nonionizing radiation: Secondary | ICD-10-CM | POA: Diagnosis not present

## 2021-07-02 ENCOUNTER — Telehealth: Payer: Self-pay | Admitting: Internal Medicine

## 2021-07-02 ENCOUNTER — Encounter: Payer: Self-pay | Admitting: Internal Medicine

## 2021-07-02 NOTE — Telephone Encounter (Signed)
Form received and will complete by next Thursday.

## 2021-07-02 NOTE — Progress Notes (Signed)
Outside notes received. Information abstracted. Notes sent to scan.  

## 2021-07-02 NOTE — Telephone Encounter (Signed)
Type of form received - Max [name] Leave of Absence Request  Form placed in- Provider mailbox    Additional instructions from the patient- Notify by phone when complete [phone]   Things to remember: [place] office: If form received in person, remind patient that forms take 7-10 business days CMA should attach charge sheet and put on Supervisor's desk

## 2021-07-08 NOTE — Telephone Encounter (Signed)
Form completed and faxed.  Copy saved and sent to scan place.  Original copy left up front for pick up with posting sheet for payment.

## 2021-07-14 DIAGNOSIS — L578 Other skin changes due to chronic exposure to nonionizing radiation: Secondary | ICD-10-CM | POA: Diagnosis not present

## 2021-07-29 DIAGNOSIS — L578 Other skin changes due to chronic exposure to nonionizing radiation: Secondary | ICD-10-CM | POA: Diagnosis not present

## 2021-08-12 DIAGNOSIS — L578 Other skin changes due to chronic exposure to nonionizing radiation: Secondary | ICD-10-CM | POA: Diagnosis not present

## 2021-08-25 ENCOUNTER — Encounter: Payer: Self-pay | Admitting: Internal Medicine

## 2021-08-25 NOTE — Patient Instructions (Addendum)
? ? ? ?Blood work was ordered.   ? ? ?Medications changes include :   none ? ? ? ?Return in about 6 months (around 02/26/2022) for 6 month f/u. ? ? ? ?Health Maintenance, Male ?Adopting a healthy lifestyle and getting preventive care are important in promoting health and wellness. Ask your health care provider about: ?The right schedule for you to have regular tests and exams. ?Things you can do on your own to prevent diseases and keep yourself healthy. ?What should I know about diet, weight, and exercise? ?Eat a healthy diet ? ?Eat a diet that includes plenty of vegetables, fruits, low-fat dairy products, and lean protein. ?Do not eat a lot of foods that are high in solid fats, added sugars, or sodium. ?Maintain a healthy weight ?Body mass index (BMI) is a measurement that can be used to identify possible weight problems. It estimates body fat based on height and weight. Your health care provider can help determine your BMI and help you achieve or maintain a healthy weight. ?Get regular exercise ?Get regular exercise. This is one of the most important things you can do for your health. Most adults should: ?Exercise for at least 150 minutes each week. The exercise should increase your heart rate and make you sweat (moderate-intensity exercise). ?Do strengthening exercises at least twice a week. This is in addition to the moderate-intensity exercise. ?Spend less time sitting. Even light physical activity can be beneficial. ?Watch cholesterol and blood lipids ?Have your blood tested for lipids and cholesterol at 75 years of age, then have this test every 5 years. ?You may need to have your cholesterol levels checked more often if: ?Your lipid or cholesterol levels are high. ?You are older than 75 years of age. ?You are at high risk for heart disease. ?What should I know about cancer screening? ?Many types of cancers can be detected early and may often be prevented. Depending on your health history and family history,  you may need to have cancer screening at various ages. This may include screening for: ?Colorectal cancer. ?Prostate cancer. ?Skin cancer. ?Lung cancer. ?What should I know about heart disease, diabetes, and high blood pressure? ?Blood pressure and heart disease ?High blood pressure causes heart disease and increases the risk of stroke. This is more likely to develop in people who have high blood pressure readings or are overweight. ?Talk with your health care provider about your target blood pressure readings. ?Have your blood pressure checked: ?Every 3-5 years if you are 2-41 years of age. ?Every year if you are 28 years old or older. ?If you are between the ages of 6 and 20 and are a current or former smoker, ask your health care provider if you should have a one-time screening for abdominal aortic aneurysm (AAA). ?Diabetes ?Have regular diabetes screenings. This checks your fasting blood sugar level. Have the screening done: ?Once every three years after age 27 if you are at a normal weight and have a low risk for diabetes. ?More often and at a younger age if you are overweight or have a high risk for diabetes. ?What should I know about preventing infection? ?Hepatitis B ?If you have a higher risk for hepatitis B, you should be screened for this virus. Talk with your health care provider to find out if you are at risk for hepatitis B infection. ?Hepatitis C ?Blood testing is recommended for: ?Everyone born from [birth year] through 1964. ?Anyone with known risk factors for hepatitis C. ?Sexually transmitted infections (STIs) ?  You should be screened each year for STIs, including gonorrhea and chlamydia, if: ?You are sexually active and are younger than 75 years of age. ?You are older than 75 years of age and your health care provider tells you that you are at risk for this type of infection. ?Your sexual activity has changed since you were last screened, and you are at increased risk for chlamydia or gonorrhea. Ask  your health care provider if you are at risk. ?Ask your health care provider about whether you are at high risk for HIV. Your health care provider may recommend a prescription medicine to help prevent HIV infection. If you choose to take medicine to prevent HIV, you should first get tested for HIV. You should then be tested every 3 months for as long as you are taking the medicine. ?Follow these instructions at home: ?Alcohol use ?Do not drink alcohol if your health care provider tells you not to drink. ?If you drink alcohol: ?Limit how much you have to 0-2 drinks a day. ?Know how much alcohol is in your drink. In the [place] one drink equals one 12 oz bottle of beer (355 mL), one 5 oz glass of wine (148 mL), or one 1? oz glass of hard liquor (44 mL). ?Lifestyle ?Do not use any products that contain nicotine or tobacco. These products include cigarettes, chewing tobacco, and vaping devices, such as e-cigarettes. If you need help quitting, ask your health care provider. ?Do not use street drugs. ?Do not share needles. ?Ask your health care provider for help if you need support or information about quitting drugs. ?General instructions ?Schedule regular health, dental, and eye exams. ?Stay current with your vaccines. ?Tell your health care provider if: ?You often feel depressed. ?You have ever been abused or do not feel safe at home. ?Summary ?Adopting a healthy lifestyle and getting preventive care are important in promoting health and wellness. ?Follow your health care provider's instructions about healthy diet, exercising, and getting tested or screened for diseases. ?Follow your health care provider's instructions on monitoring your cholesterol and blood pressure. ?This information is not intended to replace advice given to you by your health care provider. Make sure you discuss any questions you have with your health care provider. ?Document Revised: 10/06/2020 Document Reviewed: 10/06/2020 ?Elsevier Patient  Education ? [place]. ? ?

## 2021-08-25 NOTE — Progress Notes (Signed)
? ? ?Subjective:  ? ? Patient ID: Max [name], male    DOB: 11/19/1945, 76 y.o.   MRN: [id number] ? ? ?This visit occurred during the SARS-CoV-2 public health emergency.  Safety protocols were in place, including screening questions prior to the visit, additional usage of staff PPE, and extensive cleaning of exam room while observing appropriate contact time as indicated for disinfecting solutions. ? ? ?HPI ?Max [name] is here for  ?Chief Complaint  ?Patient presents with  ? Annual Exam  ? ? ? ?Seeing derm for itching/eczema.  He is doing injections.   ? ? ?His anxiety is up - he is on fluoxetine.   ? ? ?He is taking his other medications.  Overall he feels well and has no major concerns. ? ? ? ?Medications and allergies reviewed with patient and updated if appropriate. ? ? ?Current Outpatient Medications on File Prior to Visit  ?Medication Sig Dispense Refill  ? ADBRY 150 MG/ML SOSY     ? albuterol (VENTOLIN HFA) 108 (90 Base) MCG/ACT inhaler INHALE 2 PUFFS BY MOUTH FOUR TIMES A DAY AS NEEDED    ? amLODipine (NORVASC) 10 MG tablet Take 1 tablet (10 mg total) by mouth daily. 90 tablet 1  ? aspirin 81 MG chewable tablet CHEW ONE TABLET BY MOUTH EVERY DAY    ? BD PEN NEEDLE NANO U/F 32G X 4 MM MISC use as directed 100 each 1  ? FLUoxetine (PROZAC) 20 MG capsule Take 20 mg by mouth daily at 6 (six) AM.    ? insulin glargine-yfgn (SEMGLEE) 100 UNIT/ML Pen INJECT 24 UNITS SUBCUTANEOUSLY DAILY FOR DIABETES. (CONVERTED FROM LANTUS)    ? lisinopril (ZESTRIL) 10 MG tablet Take 1 tablet (10 mg total) by mouth daily. 90 tablet 3  ? metFORMIN (GLUCOPHAGE) 1000 MG tablet Take 1,000 mg by mouth 2 (two) times daily with a meal.     ? ?No current facility-administered medications on file prior to visit.  ? ? ?Review of Systems  ?Constitutional:  Negative for chills and fever.  ?Eyes:  Positive for visual disturbance (at times - will get eye checked).  ?Respiratory:  Negative for cough, shortness of breath and wheezing.    ?Cardiovascular:  Negative for chest pain, palpitations and leg swelling.  ?Gastrointestinal:  Negative for abdominal pain, blood in stool, constipation, diarrhea and nausea.  ?     No gerd  ?Genitourinary:  Negative for difficulty urinating, dysuria and hematuria.  ?Musculoskeletal:  Positive for arthralgias (mild). Negative for back pain.  ?Skin:  Positive for rash (eczema).  ?     Itching - eczema  ?Neurological:  Negative for dizziness, light-headedness and headaches.  ?Psychiatric/Behavioral:  Positive for dysphoric mood (occ). The patient is nervous/anxious.   ? ?   ?Objective:  ? ?Vitals:  ? 08/26/21 0927 08/26/21 1003  ?BP: (!) 168/80 (!) 146/82  ?Pulse: 63   ?Temp: 98.1 ?F (36.7 ?C)   ?SpO2: 99%   ? ?Filed Weights  ? 08/26/21 0927  ?Weight: 163 lb 6.4 oz (74.1 kg)  ? ?Body mass index is 23.45 kg/m?. ? ?BP Readings from Last 3 Encounters:  ?08/26/21 (!) 146/82  ?06/10/21 (!) 160/72  ?02/18/21 (!) 160/80  ? ? ?Wt Readings from Last 3 Encounters:  ?08/26/21 163 lb 6.4 oz (74.1 kg)  ?06/10/21 167 lb 12.8 oz (76.1 kg)  ?02/18/21 168 lb (76.2 kg)  ? ? ?  ?Physical Exam ?Constitutional: He appears well-developed and well-nourished. No distress.  ?HENT:  ?Head: Normocephalic and atraumatic.  ?  Right Ear: External ear normal.  ?Left Ear: External ear normal.  ?Mouth/Throat: Oropharynx is clear and moist.  ?Normal ear canals and TM b/l  ?Eyes: Conjunctivae and EOM are normal.  ?Neck: Neck supple. No tracheal deviation present. No thyromegaly present.  ?No carotid bruit  ?Cardiovascular: Normal rate, regular rhythm, normal heart sounds and intact distal pulses.   ?No murmur heard. ?Pulmonary/Chest: Effort normal and breath sounds normal. No respiratory distress. He has no wheezes. He has no rales.  ?Abdominal: Soft. He exhibits no distension. There is no tenderness.  ?Genitourinary: deferred  ?Musculoskeletal: He exhibits no edema.  ?Lymphadenopathy:   He has no cervical adenopathy.  ?Skin: Skin is warm and dry. He is  not diaphoretic.  ?Psychiatric: He has a normal mood and affect. His behavior is normal.  ? ? ? ? ?   ?Assessment & Plan:  ? ?Physical exam: ?Screening blood work  ordered ?Exercise   active, walks about one mile ?Weight  normal ?Substance abuse   none ? ? ?Reviewed recommended immunizations.  Immunizations up-to-date through [place]. ? ?He is scheduling an eye appointment soon. ? ?Other health maintenance up-to-date through the [place]. ? ? ?Health Maintenance  ?Topic Date Due  ? Pneumonia Vaccine 46+ Years old (1 - PCV) 06[birth year]/1952  ? OPHTHALMOLOGY EXAM  Never done  ? Zoster Vaccines- Shingrix (1 of 2) Never done  ? FOOT EXAM  07/04/2014  ? COLONOSCOPY (Pts 45-37yr Insurance coverage will need to be confirmed)  03/25/2019  ? COVID-19 Vaccine (3 - Moderna risk series) 09/21/2019  ? TETANUS/TDAP  07/29/2020  ? HEMOGLOBIN A1C  02/26/2022  ? INFLUENZA VACCINE  Completed  ? Hepatitis C Screening  Completed  ? HPV VACCINES  Aged Out  ? ? ? ?See Problem List for Assessment and Plan of chronic medical problems. ? ? ?

## 2021-08-26 ENCOUNTER — Ambulatory Visit (INDEPENDENT_AMBULATORY_CARE_PROVIDER_SITE_OTHER): Payer: Medicare Other | Admitting: Internal Medicine

## 2021-08-26 VITALS — BP 146/82 | HR 63 | Temp 98.1°F | Ht 70.0 in | Wt 163.4 lb

## 2021-08-26 DIAGNOSIS — Z0000 Encounter for general adult medical examination without abnormal findings: Secondary | ICD-10-CM | POA: Diagnosis not present

## 2021-08-26 DIAGNOSIS — Z794 Long term (current) use of insulin: Secondary | ICD-10-CM

## 2021-08-26 DIAGNOSIS — E119 Type 2 diabetes mellitus without complications: Secondary | ICD-10-CM | POA: Diagnosis not present

## 2021-08-26 DIAGNOSIS — I10 Essential (primary) hypertension: Secondary | ICD-10-CM | POA: Diagnosis not present

## 2021-08-26 DIAGNOSIS — E782 Mixed hyperlipidemia: Secondary | ICD-10-CM | POA: Diagnosis not present

## 2021-08-26 DIAGNOSIS — F419 Anxiety disorder, unspecified: Secondary | ICD-10-CM

## 2021-08-26 DIAGNOSIS — M609 Myositis, unspecified: Secondary | ICD-10-CM | POA: Insufficient documentation

## 2021-08-26 LAB — COMPREHENSIVE METABOLIC PANEL
ALT: 9 U/L (ref 0–53)
AST: 15 U/L (ref 0–37)
Albumin: 4.4 g/dL (ref 3.5–5.2)
Alkaline Phosphatase: 74 U/L (ref 39–117)
BUN: 22 mg/dL (ref 6–23)
CO2: 27 mEq/L (ref 19–32)
Calcium: 9.5 mg/dL (ref 8.4–10.5)
Chloride: 104 mEq/L (ref 96–112)
Creatinine, Ser: 1.39 mg/dL (ref 0.40–1.50)
GFR: 49.71 mL/min — ABNORMAL LOW (ref >60.00)
Glucose, Bld: 64 mg/dL — ABNORMAL LOW (ref 70–99)
Potassium: 3.9 mEq/L (ref 3.5–5.1)
Sodium: 140 mEq/L (ref 135–145)
Total Bilirubin: 0.4 mg/dL (ref 0.2–1.2)
Total Protein: 8.1 g/dL (ref 6.0–8.3)

## 2021-08-26 LAB — CBC WITH DIFFERENTIAL/PLATELET
Basophils Absolute: 0.1 10*3/uL (ref 0.0–0.1)
Basophils Relative: 1.1 % (ref 0.0–3.0)
Eosinophils Absolute: 0.5 10*3/uL (ref 0.0–0.7)
Eosinophils Relative: 7.5 % — ABNORMAL HIGH (ref 0.0–5.0)
HCT: 41.8 % (ref 39.0–52.0)
Hemoglobin: 13.7 g/dL (ref 13.0–17.0)
Lymphocytes Relative: 17.1 % (ref 12.0–46.0)
Lymphs Abs: 1.1 10*3/uL (ref 0.7–4.0)
MCHC: 32.7 g/dL (ref 30.0–36.0)
MCV: 94.8 fl (ref 78.0–100.0)
Monocytes Absolute: 0.6 10*3/uL (ref 0.1–1.0)
Monocytes Relative: 9.7 % (ref 3.0–12.0)
Neutro Abs: 4 10*3/uL (ref 1.4–7.7)
Neutrophils Relative %: 64.6 % (ref 43.0–77.0)
Platelets: 271 10*3/uL (ref 150.0–400.0)
RBC: 4.41 Mil/uL (ref 4.22–5.81)
RDW: 15.4 % (ref 11.5–15.5)
WBC: 6.2 10*3/uL (ref 4.0–10.5)

## 2021-08-26 LAB — MICROALBUMIN / CREATININE URINE RATIO
Creatinine,U: 92.1 mg/dL
Microalb Creat Ratio: 63.3 mg/g — ABNORMAL HIGH (ref 0.0–30.0)
Microalb, Ur: 58.2 mg/dL — ABNORMAL HIGH (ref 0.0–1.9)

## 2021-08-26 LAB — LIPID PANEL
Cholesterol: 221 mg/dL — ABNORMAL HIGH (ref 0–200)
HDL: 64.1 mg/dL (ref >39.00)
LDL Cholesterol: 135 mg/dL — ABNORMAL HIGH (ref 0–99)
NonHDL: 157.07
Total CHOL/HDL Ratio: 3
Triglycerides: 108 mg/dL (ref 0.0–149.0)
VLDL: 21.6 mg/dL (ref 0.0–40.0)

## 2021-08-26 LAB — HEMOGLOBIN A1C: Hgb A1c MFr Bld: 7.6 % — ABNORMAL HIGH (ref 4.6–6.5)

## 2021-08-26 NOTE — Assessment & Plan Note (Addendum)
Chronic ?Blood pressure not controlled-higher initially, but better on recheck ?[facility] ?He does not recall what his blood pressure readings are at the [facility] ?No changes in medication today ?CMP ?Continue lisinopril 10 mg daily, amlodipine 10 mg daily ?

## 2021-08-26 NOTE — Assessment & Plan Note (Addendum)
Chronic ?Check A1c ?Continue insulin glargine 24 units daily, metformin 1000 mg twice daily ?Encouraged diabetic diet, regular exercise ? ?

## 2021-08-26 NOTE — Assessment & Plan Note (Addendum)
Chronic ?History of statin induced myositis ?Check lipid panel, CMP ?Encouraged regular exercise, healthy diet ?Diet controlled ?

## 2021-08-26 NOTE — Assessment & Plan Note (Addendum)
Chronic Controlled, Stable Continue fluoxetine 20 mg daily 

## 2021-08-27 DIAGNOSIS — L578 Other skin changes due to chronic exposure to nonionizing radiation: Secondary | ICD-10-CM | POA: Diagnosis not present

## 2021-08-28 ENCOUNTER — Other Ambulatory Visit: Payer: Self-pay

## 2021-08-28 MED ORDER — BLOOD GLUCOSE METER KIT: PACK | 0 refills | Status: DC

## 2021-09-11 DIAGNOSIS — L578 Other skin changes due to chronic exposure to nonionizing radiation: Secondary | ICD-10-CM | POA: Diagnosis not present

## 2021-09-29 DIAGNOSIS — L578 Other skin changes due to chronic exposure to nonionizing radiation: Secondary | ICD-10-CM | POA: Diagnosis not present

## 2021-10-02 ENCOUNTER — Ambulatory Visit: Payer: Medicare Other | Admitting: *Deleted

## 2021-10-12 ENCOUNTER — Ambulatory Visit (INDEPENDENT_AMBULATORY_CARE_PROVIDER_SITE_OTHER): Payer: Medicare Other

## 2021-10-12 DIAGNOSIS — Z0000 Encounter for general adult medical examination without abnormal findings: Secondary | ICD-10-CM

## 2021-10-12 NOTE — Patient Instructions (Signed)
Max [name] , ?Thank you for taking time to come for your Medicare Wellness Visit. I appreciate your ongoing commitment to your health goals. Please review the following plan we discussed and let me know if I can assist you in the future.  ? ?Screening recommendations/referrals: ?Colonoscopy: last done 03/24/2009; due every 10 years ?Recommended yearly ophthalmology/optometry visit for glaucoma screening and checkup ?Recommended yearly dental visit for hygiene and checkup ? ?Vaccinations: ?Influenza vaccine: 03/14/2021 ?Pneumococcal vaccine: declined ?Tdap vaccine: due ?Shingles vaccine: declined   ?Covid-19: 07/27/2019, 08/24/2019 ? ?Advanced directives: No ? ?Conditions/risks identified: Yes ? ?Next appointment: Please schedule your next Medicare Wellness Visit with your Nurse Health Advisor in 1 year by calling [phone]. ? ?Preventive Care 75 Years and Older, Male ?Preventive care refers to lifestyle choices and visits with your health care provider that can promote health and wellness. ?What does preventive care include? ?A yearly physical exam. This is also called an annual well check. ?Dental exams once or twice a year. ?Routine eye exams. Ask your health care provider how often you should have your eyes checked. ?Personal lifestyle choices, including: ?Daily care of your teeth and gums. ?Regular physical activity. ?Eating a healthy diet. ?Avoiding tobacco and drug use. ?Limiting alcohol use. ?Practicing safe sex. ?Taking low doses of aspirin every day. ?Taking vitamin and mineral supplements as recommended by your health care provider. ?What happens during an annual well check? ?The services and screenings done by your health care provider during your annual well check will depend on your age, overall health, lifestyle risk factors, and family history of disease. ?Counseling  ?Your health care provider may ask you questions about your: ?Alcohol use. ?Tobacco use. ?Drug use. ?Emotional well-being. ?Home and  relationship well-being. ?Sexual activity. ?Eating habits. ?History of falls. ?Memory and ability to understand (cognition). ?Work and work Statistician. ?Screening  ?You may have the following tests or measurements: ?Height, weight, and BMI. ?Blood pressure. ?Lipid and cholesterol levels. These may be checked every 5 years, or more frequently if you are over 80 years old. ?Skin check. ?Lung cancer screening. You may have this screening every year starting at age 52 if you have a 30-pack-year history of smoking and currently smoke or have quit within the past 15 years. ?Fecal occult blood test (FOBT) of the stool. You may have this test every year starting at age 67. ?Flexible sigmoidoscopy or colonoscopy. You may have a sigmoidoscopy every 5 years or a colonoscopy every 10 years starting at age 57. ?Prostate cancer screening. Recommendations will vary depending on your family history and other risks. ?Hepatitis C blood test. ?Hepatitis B blood test. ?Sexually transmitted disease (STD) testing. ?Diabetes screening. This is done by checking your blood sugar (glucose) after you have not eaten for a while (fasting). You may have this done every 1-3 years. ?Abdominal aortic aneurysm (AAA) screening. You may need this if you are a current or former smoker. ?Osteoporosis. You may be screened starting at age 32 if you are at high risk. ?Talk with your health care provider about your test results, treatment options, and if necessary, the need for more tests. ?Vaccines  ?Your health care provider may recommend certain vaccines, such as: ?Influenza vaccine. This is recommended every year. ?Tetanus, diphtheria, and acellular pertussis (Tdap, Td) vaccine. You may need a Td booster every 10 years. ?Zoster vaccine. You may need this after age 67. ?Pneumococcal 13-valent conjugate (PCV13) vaccine. One dose is recommended after age 91. ?Pneumococcal polysaccharide (PPSV23) vaccine. One dose is recommended after  age 36. ?Talk to your  health care provider about which screenings and vaccines you need and how often you need them. ?This information is not intended to replace advice given to you by your health care provider. Make sure you discuss any questions you have with your health care provider. ?Document Released: 06/13/2015 Document Revised: 02/04/2016 Document Reviewed: 03/18/2015 ?Elsevier Interactive Patient Education ? 2017 [employer]. ? ?Fall Prevention in the Home ?Falls can cause injuries. They can happen to people of all ages. There are many things you can do to make your home safe and to help prevent falls. ?What can I do on the outside of my home? ?Regularly fix the edges of walkways and driveways and fix any cracks. ?Remove anything that might make you trip as you walk through a door, such as a raised step or threshold. ?Trim any bushes or trees on the path to your home. ?Use bright outdoor lighting. ?Clear any walking paths of anything that might make someone trip, such as rocks or tools. ?Regularly check to see if handrails are loose or broken. Make sure that both sides of any steps have handrails. ?Any raised decks and porches should have guardrails on the edges. ?Have any leaves, snow, or ice cleared regularly. ?Use sand or salt on walking paths during winter. ?Clean up any spills in your garage right away. This includes oil or grease spills. ?What can I do in the bathroom? ?Use night lights. ?Install grab bars by the toilet and in the tub and shower. Do not use towel bars as grab bars. ?Use non-skid mats or decals in the tub or shower. ?If you need to sit down in the shower, use a plastic, non-slip stool. ?Keep the floor dry. Clean up any water that spills on the floor as soon as it happens. ?Remove soap buildup in the tub or shower regularly. ?Attach bath mats securely with double-sided non-slip rug tape. ?Do not have throw rugs and other things on the floor that can make you trip. ?What can I do in the bedroom? ?Use night  lights. ?Make sure that you have a light by your bed that is easy to reach. ?Do not use any sheets or blankets that are too big for your bed. They should not hang down onto the floor. ?Have a firm chair that has side arms. You can use this for support while you get dressed. ?Do not have throw rugs and other things on the floor that can make you trip. ?What can I do in the kitchen? ?Clean up any spills right away. ?Avoid walking on wet floors. ?Keep items that you use a lot in easy-to-reach places. ?If you need to reach something above you, use a strong step stool that has a grab bar. ?Keep electrical cords out of the way. ?Do not use floor polish or wax that makes floors slippery. If you must use wax, use non-skid floor wax. ?Do not have throw rugs and other things on the floor that can make you trip. ?What can I do with my stairs? ?Do not leave any items on the stairs. ?Make sure that there are handrails on both sides of the stairs and use them. Fix handrails that are broken or loose. Make sure that handrails are as long as the stairways. ?Check any carpeting to make sure that it is firmly attached to the stairs. Fix any carpet that is loose or worn. ?Avoid having throw rugs at the top or bottom of the stairs. If you do  have throw rugs, attach them to the floor with carpet tape. ?Make sure that you have a light switch at the top of the stairs and the bottom of the stairs. If you do not have them, ask someone to add them for you. ?What else can I do to help prevent falls? ?Wear shoes that: ?Do not have high heels. ?Have rubber bottoms. ?Are comfortable and fit you well. ?Are closed at the toe. Do not wear sandals. ?If you use a stepladder: ?Make sure that it is fully opened. Do not climb a closed stepladder. ?Make sure that both sides of the stepladder are locked into place. ?Ask someone to hold it for you, if possible. ?Clearly mark and make sure that you can see: ?Any grab bars or handrails. ?First and last  steps. ?Where the edge of each step is. ?Use tools that help you move around (mobility aids) if they are needed. These include: ?Canes. ?Walkers. ?Scooters. ?Crutches. ?Turn on the lights when you go into a d

## 2021-10-12 NOTE — Progress Notes (Signed)
?I connected with Max [name] today by telephone and verified that I am speaking with the correct person using two identifiers. ?Location patient: home ?Location provider: work ?Persons participating in the virtual visit: patient, provider. ?  ?I discussed the limitations, risks, security and privacy concerns of performing an evaluation and management service by telephone and the availability of in person appointments. I also discussed with the patient that there may be a patient responsible charge related to this service. The patient expressed understanding and verbally consented to this telephonic visit.  ?  ?Interactive audio and video telecommunications were attempted between this provider and patient, however failed, due to patient having technical difficulties OR patient did not have access to video capability.  We continued and completed visit with audio only. ? ?Some vital signs may be absent or patient reported.  ? ?Time Spent with patient on telephone encounter: 30 minutes ? ?Subjective:  ? Max [name] is a 76 y.o. male who presents for Medicare Annual/Subsequent preventive examination. ? ?Review of Systems    ? ?Cardiac Risk Factors include: advanced age (>45mn, >>17women);diabetes mellitus;hypertension;male gender ? ?   ?Objective:  ?  ?There were no vitals filed for this visit. ?There is no height or weight on file to calculate BMI. ? ? ?  10/12/2021  ? 11:05 AM 09/13/2020  ?  8:38 AM 01/20/2020  ?  7:49 AM 04/23/2017  ?  8:19 AM 08/17/2016  ? 11:40 AM 07/23/2016  ? 10:03 AM 07/08/2016  ? 11:03 AM  ?Advanced Directives  ?Does Patient Have a Medical Advance Directive? No No No No No No No  ?Would patient like information on creating a medical advance directive? No - Patient declined   No - Patient declined No - Patient declined    ? ? ?Current Medications (verified) ?Outpatient Encounter Medications as of 10/12/2021  ?Medication Sig  ? ADBRY 150 MG/ML SOSY   ? albuterol (VENTOLIN HFA) 108 (90 Base)  MCG/ACT inhaler INHALE 2 PUFFS BY MOUTH FOUR TIMES A DAY AS NEEDED  ? amLODipine (NORVASC) 10 MG tablet Take 1 tablet (10 mg total) by mouth daily.  ? aspirin 81 MG chewable tablet CHEW ONE TABLET BY MOUTH EVERY DAY  ? BD PEN NEEDLE NANO U/F 32G X 4 MM MISC use as directed  ? blood glucose meter kit and supplies Dispense based on patient and insurance preference. Use up to two times daily as directed. (FOR ICD-10 E10.9, E11.9).  ? FLUoxetine (PROZAC) 20 MG capsule Take 20 mg by mouth daily at 6 (six) AM.  ? insulin glargine-yfgn (SEMGLEE) 100 UNIT/ML Pen INJECT 24 UNITS SUBCUTANEOUSLY DAILY FOR DIABETES. (CONVERTED FROM LANTUS)  ? lisinopril (ZESTRIL) 10 MG tablet Take 1 tablet (10 mg total) by mouth daily.  ? metFORMIN (GLUCOPHAGE) 1000 MG tablet Take 1,000 mg by mouth 2 (two) times daily with a meal.   ? ?No facility-administered encounter medications on file as of 10/12/2021.  ? ? ?Allergies (verified) ?Simvastatin  ? ?History: ?Past Medical History:  ?Diagnosis Date  ? ANXIETY, SITUATIONAL   ? DEGENERATIVE DISC DISEASE   ? DERMATITIS   ? DIABETES MELLITUS, TYPE II, ON INSULIN, CONTROLLED   ? Eosinophilia   ? HYPERLIPIDEMIA   ? HYPERTENSION   ? LOW BACK PAIN   ? PRURITUS 2011  ? RENAL CELL CANCER 01/25/2005  ? s/p partial nephrectomy  ? SEPTIC ARTHRITIS 07/2009  ? L ankle - [facility]  ? SHOULDER PAIN, RIGHT, CHRONIC   ? ?Past Surgical History:  ?Procedure  Laterality Date  ? BACK SURGERY  04/2005  ? Dr. Annette Stable  ? LAPAROSCOPIC PARTIAL NEPHRECTOMY  01/25/05  ? Dr. Risa Grill  ? REPAIR ANKLE LIGAMENT  04/2009  ? disatal fib/infx  ? ?Family History  ?Problem Relation Age of Onset  ? Asthma Father   ? ?Social History  ? ?Socioeconomic History  ? Marital status: Widowed  ?  Spouse name: Not on file  ? Number of children: Not on file  ? Years of education: Not on file  ? Highest education level: Not on file  ?Occupational History  ? Not on file  ?Tobacco Use  ? Smoking status: Former  ?  Types: Cigarettes  ?  Quit date: 05/31/1994  ?   Years since quitting: 27.3  ? Smokeless tobacco: Never  ? Tobacco comments:  ?  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 Alcohol use: Yes  ?  Comment: occasionally  ? Drug use: No  ? Sexual activity: Not on file  ?Other Topics Concern  ? Not on file  ?Social History Narrative  ? Not on file  ? ?Social Determinants of Health  ? ?Financial Resource Strain: Low Risk   ? Difficulty of Paying Living Expenses: Not hard at all  ?Food Insecurity: No Food Insecurity  ? Worried About Charity fundraiser in the Last Year: Never true  ? Ran Out of Food in the Last Year: Never true  ?Transportation Needs: No Transportation Needs  ? Lack of Transportation (Medical): No  ? Lack of Transportation (Non-Medical): No  ?Physical Activity: Sufficiently Active  ? Days of Exercise per Week: 5 days  ? Minutes of Exercise per Session: 30 min  ?Stress: No Stress Concern Present  ? Feeling of Stress : Not at all  ?Social Connections: Moderately Integrated  ? Frequency of Communication with Friends and Family: More than three times a week  ? Frequency of Social Gatherings with Friends and Family: More than three times a week  ? Attends Religious Services: More than 4 times per year  ? Active Member of Clubs or Organizations: Yes  ? Attends Archivist Meetings: More than 4 times per year  ? Marital Status: Widowed  ? ? 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 ? ?Clinical Intake: ? ?Pre-visit preparation completed: Yes ? ?Pain : No/denies pain ? ?  ? ?Nutritional Risks: None ?Diabetes: Yes ?CBG done?: No ?Did pt. bring in CBG monitor from home?: No ? ?How often do you need to have someone help you when you read instructions, pamphlets, or other written materials from your doctor or pharmacy?: 1 - Never ?What is the last grade level you completed in school?: HSG ? ?Diabetic? yes ? ?Interpreter Needed?:  No ? ?Information entered by :: Lisette Abu, LPN. ? ? ?Activities of Daily Living ? ?  10/12/2021  ? 11:23 AM 06/10/2021  ? 10:41 AM  ?In your present state of health, do you have any difficulty performing the following activities:  ?Hearing? 0 0  ?Vision? 0 0  ?Difficulty concentrating or making decisions? 0 0  ?Walking or climbing stairs? 0 0  ?Dressing or bathing? 0 0  ?Doing errands, shopping? 0 0  ?Preparing Food and eating ? N   ?Using the Toilet? N   ?In the past six months, have you accidently leaked urine? N   ?Do you have problems with loss of bowel control? N   ?  Managing your Medications? N   ?Managing your Finances? N   ?Housekeeping or managing your Housekeeping? N   ? ? ?Patient Care Team: ?Binnie Rail, MD as PCP - General (Internal Medicine) ?Nira Retort, MD (Hematology and Oncology) ?Milus Banister, MD (Gastroenterology) ? ?Indicate any recent Medical Services you may have received from other than [facility] providers in the past year (date may be approximate). ? ?   ?Assessment:  ? This is a routine wellness examination for Max [name]. ? ?Hearing/Vision screen ?Hearing Screening - Comments:: Patient denied any hearing difficulties. ?No hearing aids. ?Vision Screening - Comments:: Patient wears eyeglasses. ?Eye exam done by: [facility] ? ?Dietary issues and exercise activities discussed: ?Current Exercise Habits: Home exercise routine, Type of exercise: walking, Time (Minutes): 30, Frequency (Times/Week): 5, Weekly Exercise (Minutes/Week): 150, Intensity: Moderate, Exercise limited by: None identified ? ? Goals Addressed   ? ?  ?  ?  ?  ? This Visit's Progress  ?  My goal is to maintain my independence and stay alive.     ? ?  ?Depression Screen ? ?  10/12/2021  ? 11:09 AM 06/10/2021  ? 10:41 AM 07/02/2016  ?  2:50 PM 06/11/2015  ?  7:36 PM 07/04/2013  ?  1:37 PM  ?PHQ 2/9 Scores  ?PHQ - 2 Score 0 0 0 0 0  ?  ?Fall Risk ? ?  10/12/2021  ? 11:06 AM 06/10/2021  ? 10:37 AM 07/02/2016  ?  2:50 PM 06/11/2015  ?   7:36 PM 07/04/2013  ?  1:37 PM  ?Fall Risk   ?Falls in the past year? 0 0 No No No  ?Number falls in past yr: 0 0     ?Injury with Fall? 0 0     ?Risk for fall due to : No Fall Risks No Fall Risks     ?Follow up Falls ev

## 2021-10-13 DIAGNOSIS — L578 Other skin changes due to chronic exposure to nonionizing radiation: Secondary | ICD-10-CM | POA: Diagnosis not present

## 2021-10-19 DIAGNOSIS — L578 Other skin changes due to chronic exposure to nonionizing radiation: Secondary | ICD-10-CM | POA: Diagnosis not present

## 2021-11-05 DIAGNOSIS — L578 Other skin changes due to chronic exposure to nonionizing radiation: Secondary | ICD-10-CM | POA: Diagnosis not present

## 2021-11-27 DIAGNOSIS — L578 Other skin changes due to chronic exposure to nonionizing radiation: Secondary | ICD-10-CM | POA: Diagnosis not present

## 2021-11-27 DIAGNOSIS — L2089 Other atopic dermatitis: Secondary | ICD-10-CM | POA: Diagnosis not present

## 2021-11-30 DIAGNOSIS — L2089 Other atopic dermatitis: Secondary | ICD-10-CM | POA: Diagnosis not present

## 2021-11-30 DIAGNOSIS — L578 Other skin changes due to chronic exposure to nonionizing radiation: Secondary | ICD-10-CM | POA: Diagnosis not present

## 2021-11-30 DIAGNOSIS — Z79899 Other long term (current) drug therapy: Secondary | ICD-10-CM | POA: Diagnosis not present

## 2021-12-07 DIAGNOSIS — L578 Other skin changes due to chronic exposure to nonionizing radiation: Secondary | ICD-10-CM | POA: Diagnosis not present

## 2021-12-15 ENCOUNTER — Encounter: Payer: Self-pay | Admitting: Internal Medicine

## 2021-12-15 NOTE — Patient Instructions (Signed)
     Blood work was ordered.     Medications changes include :   none   Your prescription(s) have been sent to your pharmacy.    A referral was ordered for [facility] for a colonoscopy.     Someone from that office will call you to schedule an appointment.    Return in about 6 months (around 07/01/2022) for Physical Exam.   [facility] Phone: [phone]  

## 2021-12-15 NOTE — Progress Notes (Signed)
      Subjective:    Patient ID: Max [name], male    DOB: [birth year], 76 y.o.   MRN: [id number]     HPI Max [name] is here for follow up of his chronic medical problems, htn, DM, hld   Needs statin or zetia.   ?  Checking sugars at home  Medications and allergies reviewed with patient and updated if appropriate.  Current Outpatient Medications on File Prior to Visit  Medication Sig Dispense Refill   ADBRY 150 MG/ML SOSY      albuterol (VENTOLIN HFA) 108 (90 Base) MCG/ACT inhaler INHALE 2 PUFFS BY MOUTH FOUR TIMES A DAY AS NEEDED     amLODipine (NORVASC) 10 MG tablet Take 1 tablet (10 mg total) by mouth daily. 90 tablet 1   aspirin 81 MG chewable tablet CHEW ONE TABLET BY MOUTH EVERY DAY     BD PEN NEEDLE NANO U/F 32G X 4 MM MISC use as directed 100 each 1   blood glucose meter kit and supplies Dispense based on patient and insurance preference. Use up to two times daily as directed. (FOR ICD-10 E10.9, E11.9). 1 each 0   FLUoxetine (PROZAC) 20 MG capsule Take 20 mg by mouth daily at 6 (six) AM.     insulin glargine-yfgn (SEMGLEE) 100 UNIT/ML Pen INJECT 24 UNITS SUBCUTANEOUSLY DAILY FOR DIABETES. (CONVERTED FROM LANTUS)     lisinopril (ZESTRIL) 10 MG tablet Take 1 tablet (10 mg total) by mouth daily. 90 tablet 3   metFORMIN (GLUCOPHAGE) 1000 MG tablet Take 1,000 mg by mouth 2 (two) times daily with a meal.      No current facility-administered medications on file prior to visit.     Review of Systems     Objective:  There were no vitals filed for this visit. BP Readings from Last 3 Encounters:  08/26/21 (!) 146/82  06/10/21 (!) 160/72  02/18/21 (!) 160/80   Wt Readings from Last 3 Encounters:  08/26/21 163 lb 6.4 oz (74.1 kg)  06/10/21 167 lb 12.8 oz (76.1 kg)  02/18/21 168 lb (76.2 kg)   There is no height or weight on file to calculate BMI.    Physical Exam     Lab Results  Component Value Date   WBC 6.2 08/26/2021   HGB 13.7 08/26/2021   HCT 41.8  08/26/2021   PLT 271.0 08/26/2021   GLUCOSE 64 (L) 08/26/2021   CHOL 221 (H) 08/26/2021   TRIG 108.0 08/26/2021   HDL 64.10 08/26/2021   LDLDIRECT 143.2 02/02/2012   LDLCALC 135 (H) 08/26/2021   ALT 9 08/26/2021   AST 15 08/26/2021   NA 140 08/26/2021   K 3.9 08/26/2021   CL 104 08/26/2021   CREATININE 1.39 08/26/2021   BUN 22 08/26/2021   CO2 27 08/26/2021   TSH 0.84 07/22/2016   HGBA1C 7.6 (H) 08/26/2021   MICROALBUR 58.2 (H) 08/26/2021     Assessment & Plan:    See Problem List for Assessment and Plan of chronic medical problems.   This encounter was created in error - please disregard.

## 2021-12-16 ENCOUNTER — Encounter: Payer: Medicare Other | Admitting: Internal Medicine

## 2021-12-16 DIAGNOSIS — E782 Mixed hyperlipidemia: Secondary | ICD-10-CM

## 2021-12-16 DIAGNOSIS — E119 Type 2 diabetes mellitus without complications: Secondary | ICD-10-CM

## 2021-12-16 DIAGNOSIS — I10 Essential (primary) hypertension: Secondary | ICD-10-CM

## 2021-12-16 NOTE — Assessment & Plan Note (Signed)
Chronic  Lab Results  Component Value Date   HGBA1C 7.6 (H) 08/26/2021   Sugars not ideally controlled Testing sugars 1 times a day Check A1c Continue metformin 1000 mg twice daily, glargine insulin 24 units daily Stressed regular exercise, diabetic diet

## 2021-12-16 NOTE — Assessment & Plan Note (Addendum)
Chronic History of statin induced myositis with simvastatin Regular exercise and healthy diet encouraged Check lipid panel  Discussed retrying a statin versus Zetia

## 2021-12-16 NOTE — Assessment & Plan Note (Signed)
Chronic Blood pressure well controlled CMP Continue amlodipine 10 mg daily, lisinopril 10 mg daily

## 2021-12-17 ENCOUNTER — Ambulatory Visit: Payer: Medicare Other | Admitting: Internal Medicine

## 2022-01-09 ENCOUNTER — Emergency Department (HOSPITAL_COMMUNITY)
Admission: EM | Admit: 2022-01-09 | Discharge: 2022-01-09 | Disposition: A | Discharge status: Home / Self Care | Payer: Medicare Other | Attending: Emergency Medicine | Admitting: Emergency Medicine

## 2022-01-09 ENCOUNTER — Other Ambulatory Visit: Payer: Self-pay

## 2022-01-09 ENCOUNTER — Encounter (HOSPITAL_COMMUNITY): Payer: Self-pay | Admitting: Emergency Medicine

## 2022-01-09 DIAGNOSIS — Z7984 Long term (current) use of oral hypoglycemic drugs: Secondary | ICD-10-CM | POA: Insufficient documentation

## 2022-01-09 DIAGNOSIS — E119 Type 2 diabetes mellitus without complications: Secondary | ICD-10-CM | POA: Insufficient documentation

## 2022-01-09 DIAGNOSIS — T1591XA Foreign body on external eye, part unspecified, right eye, initial encounter: Secondary | ICD-10-CM | POA: Insufficient documentation

## 2022-01-09 DIAGNOSIS — X58XXXA Exposure to other specified factors, initial encounter: Secondary | ICD-10-CM | POA: Insufficient documentation

## 2022-01-09 DIAGNOSIS — Z7982 Long term (current) use of aspirin: Secondary | ICD-10-CM | POA: Diagnosis not present

## 2022-01-09 DIAGNOSIS — Z794 Long term (current) use of insulin: Secondary | ICD-10-CM | POA: Diagnosis not present

## 2022-01-09 DIAGNOSIS — T1501XA Foreign body in cornea, right eye, initial encounter: Secondary | ICD-10-CM | POA: Diagnosis not present

## 2022-01-09 MED ORDER — FLUORESCEIN SODIUM 1 MG OP STRP
1.0000 | ORAL_STRIP | Freq: Once | OPHTHALMIC | Status: AC
  Administered 2022-01-09: 1 via OPHTHALMIC
  Filled 2022-01-09: qty 1

## 2022-01-09 MED ORDER — TETRACAINE HCL 0.5 % OP SOLN
2.0000 [drp] | Freq: Once | OPHTHALMIC | Status: AC
  Administered 2022-01-09: 2 [drp] via OPHTHALMIC
  Filled 2022-01-09: qty 4

## 2022-01-09 NOTE — ED Provider Notes (Signed)
[facility] DEPT Provider Note   CSN: [id number] Arrival date & time: 01/09/22  1403     History  No chief complaint on file.   Nyzir Krist is a 76 y.o. male.  With no relevant past medical history who presents to the ED for evaluation of foreign body in right eye.  He states that yesterday he was shampooing his head and believes he might have gotten some of his dandruff in his right eye.  He reports the sensation is a scratchy feeling on the inside of his eye lid that is causing irritation and tearing.  He has not tried irrigation at home.  Denies blurry vision or decreased visual acuity.  States he wears glasses normally.        Home Medications Prior to Admission medications   Medication Sig Start Date End Date Taking? Authorizing Provider  ADBRY 150 MG/ML SOSY  05/19/21   [provider]  albuterol (VENTOLIN HFA) 108 (90 Base) MCG/ACT inhaler INHALE 2 PUFFS BY MOUTH FOUR TIMES A DAY AS NEEDED 04/14/21   [provider]  amLODipine (NORVASC) 10 MG tablet Take 1 tablet (10 mg total) by mouth daily. 02/02/12   Rowe Clack, MD  aspirin 81 MG chewable tablet CHEW ONE TABLET BY MOUTH EVERY DAY 11/18/09   [provider]  BD PEN NEEDLE NANO U/F 32G X 4 MM MISC use as directed 02/17/13   Rowe Clack, MD  blood glucose meter kit and supplies Dispense based on patient and insurance preference. Use up to two times daily as directed. (FOR ICD-10 E10.9, E11.9). 08/28/21   Binnie Rail, MD  FLUoxetine (PROZAC) 20 MG capsule Take 20 mg by mouth daily at 6 (six) AM. 04/29/21   [provider]  insulin glargine-yfgn (SEMGLEE) 100 UNIT/ML Pen INJECT 24 UNITS SUBCUTANEOUSLY DAILY FOR DIABETES. (CONVERTED FROM LANTUS) 08/13/21   [provider]  lisinopril (ZESTRIL) 10 MG tablet Take 1 tablet (10 mg total) by mouth daily. 06/10/21   Binnie Rail, MD  metFORMIN (GLUCOPHAGE) 1000 MG tablet Take 1,000 mg by  mouth 2 (two) times daily with a meal.  12/09/10   Biagio Borg, MD      Allergies    Simvastatin    Review of Systems   Review of Systems  Eyes:  Positive for redness, itching and visual disturbance. Negative for photophobia, pain and discharge.    Physical Exam Updated Vital Signs BP (!) 152/78   Pulse 79   Temp 98.1 F (36.7 C) (Oral)   Resp 18   Ht 5' 6.5" (1.689 m)   Wt 77.1 kg   SpO2 100%   BMI 27.03 kg/m  Physical Exam Vitals and nursing note reviewed.  Constitutional:      General: He is not in acute distress.    Appearance: Normal appearance.  HENT:     Head: Normocephalic and atraumatic.  Eyes:     General: Lids are normal. Lids are everted, no foreign bodies appreciated. Vision grossly intact. Gaze aligned appropriately.        Right eye: Discharge (Excessive tearing) present. No foreign body.        Left eye: No discharge.     Extraocular Movements: Extraocular movements intact.     Conjunctiva/sclera:     Right eye: Right conjunctiva is injected. No exudate.    Left eye: Left conjunctiva is not injected. No exudate.    Pupils: Pupils are equal, round, and reactive to light.  Pulmonary:     Effort: Pulmonary effort is normal.  Abdominal:     General: Abdomen is flat.  Musculoskeletal:        General: Normal range of motion.  Neurological:     Mental Status: He is alert and oriented to person, place, and time.  Psychiatric:        Mood and Affect: Mood normal.        Behavior: Behavior normal.     ED Results / Procedures / Treatments   Labs (all labs ordered are listed, but only abnormal results are displayed) Labs Reviewed - No data to display  EKG None  Radiology No results found.  Procedures Procedures    Medications Ordered in ED Medications  fluorescein ophthalmic strip 1 strip (1 strip Right Eye Given 01/09/22 1615)  tetracaine (PONTOCAINE) 0.5 % ophthalmic solution 2 drop (2 drops Right Eye Given 01/09/22 1615)    ED Course/  Medical Decision Making/ A&P                           Medical Decision Making Patient is a 76 year old male with no relevant past medical history presents to the ED for evaluation of possible foreign body in the right eye.  Visual acuity was tested and normal for him, 20/40 in the right eye and 20/30 in the left eye., no foreign body was appreciated on physical exam with lid inversion.  Fluorescein stain with tetracaine showed no corneal abrasions or ulcers.  Patient does not wear contacts.  Patient states that he was given glasses for his vision, but states that his right eye was worse than his left so he could not use them.  I irrigated the right eye with 400 mL of normal saline following fluorescein stain.  Patient reports immediate relief of symptoms upon irrigation.  At this point I see no medical emergency and patient stable for discharge.  Since there is no abrasion or ulceration, I do not see a need for topical antibiotics.  I discussed with the patient that he may irrigate his eye at home with tap water as needed if symptoms return.  I discussed with him potential risks of driving with blurred or change in vision and that he should pull over immediately if this occurs.  At this time there does not appear to be any evidence of an acute emergency medical condition and the patient appears stable for discharge with appropriate outpatient follow up. Diagnosis was discussed with patient who verbalizes understanding of care plan and is agreeable to discharge. I have discussed return precautions with patient who verbalizes understanding. Patient encouraged to follow-up with their PCP within 1 week. All questions answered.  Note: Portions of this report may have been transcribed using voice recognition software. Every effort was made to ensure accuracy; however, inadvertent computerized transcription errors may still be present.   Risk Prescription drug management.          Final Clinical  Impression(s) / ED Diagnoses Final diagnoses:  Foreign body, eye, right, initial encounter    Rx / DC Orders ED Discharge Orders     None         Roylene Reason, Hershal Coria 01/09/22 1659    Varney Biles, MD 01/10/22 1321

## 2022-01-09 NOTE — Discharge Instructions (Addendum)
You have been seen today for your complaint of right eye irritation.  We performed fluorescein stain which showed no cuts or ulcers on your eye. Home care instructions are as follows:  You may continue to irrigate your eye lightly with tap water if symptoms return. Follow up with: Your primary care provider in 1 week. Please seek immediate medical care if you develop any of the following symptoms: Your ability to see (vision) gets worse. You have more redness and swelling in or around your eye.  At this time there does not appear to be the presence of an emergent medical condition, however there is always the potential for conditions to change. Please read and follow the below instructions.  Do not take your medicine if  develop an itchy rash, swelling in your mouth or lips, or difficulty breathing; call 911 and seek immediate emergency medical attention if this occurs.  You may review your lab tests and imaging results in their entirety on your MyChart account.  Please discuss all results of fully with your primary care provider and other specialist at your follow-up visit.  Note: Portions of this text may have been transcribed using voice recognition software. Every effort was made to ensure accuracy; however, inadvertent computerized transcription errors may still be present.

## 2022-01-25 ENCOUNTER — Emergency Department (HOSPITAL_COMMUNITY): Payer: Medicare Other

## 2022-01-25 ENCOUNTER — Encounter (HOSPITAL_COMMUNITY): Payer: Self-pay

## 2022-01-25 ENCOUNTER — Emergency Department (HOSPITAL_COMMUNITY)
Admission: EM | Admit: 2022-01-25 | Discharge: 2022-01-25 | Disposition: A | Discharge status: Home / Self Care | Payer: Medicare Other | Attending: Emergency Medicine | Admitting: Emergency Medicine

## 2022-01-25 DIAGNOSIS — E119 Type 2 diabetes mellitus without complications: Secondary | ICD-10-CM | POA: Diagnosis not present

## 2022-01-25 DIAGNOSIS — M7989 Other specified soft tissue disorders: Secondary | ICD-10-CM | POA: Diagnosis not present

## 2022-01-25 DIAGNOSIS — Z7984 Long term (current) use of oral hypoglycemic drugs: Secondary | ICD-10-CM | POA: Insufficient documentation

## 2022-01-25 DIAGNOSIS — Z7982 Long term (current) use of aspirin: Secondary | ICD-10-CM | POA: Diagnosis not present

## 2022-01-25 DIAGNOSIS — M25532 Pain in left wrist: Secondary | ICD-10-CM | POA: Diagnosis not present

## 2022-01-25 DIAGNOSIS — I10 Essential (primary) hypertension: Secondary | ICD-10-CM | POA: Diagnosis not present

## 2022-01-25 DIAGNOSIS — Z79899 Other long term (current) drug therapy: Secondary | ICD-10-CM | POA: Diagnosis not present

## 2022-01-25 MED ORDER — PREDNISONE 20 MG PO TABS: 40.0000 mg | ORAL_TABLET | Freq: Every day | ORAL | 0 refills | Status: DC

## 2022-01-25 MED ORDER — PREDNISONE 20 MG PO TABS
60.0000 mg | ORAL_TABLET | ORAL | Status: AC
  Administered 2022-01-25: 60 mg via ORAL
  Filled 2022-01-25: qty 3

## 2022-01-25 NOTE — Progress Notes (Signed)
Orthopedic Tech Progress Note Patient Details:  Max [name] [birth year] [id number]  Ortho Devices Type of Ortho Device: Velcro wrist splint Ortho Device/Splint Location: LUE Ortho Device/Splint Interventions: Ordered, Application, Adjustment   Post Interventions Patient Tolerated: Well Instructions Provided: Adjustment of device  [name] 01/25/2022, 11:38 AM

## 2022-01-25 NOTE — Discharge Instructions (Addendum)
As discussed, your wrist pain is likely due to inflammatory changes in the joint.  Please take the medication as directed and follow-up with either the [facility] or our local orthopedist.  Return here for concerning changes in your condition.

## 2022-01-25 NOTE — ED Notes (Signed)
Ortho is coming to apply wrist brace

## 2022-01-25 NOTE — ED Provider Notes (Signed)
[facility] DEPT Provider Note   CSN: [id number] Arrival date & time: 01/25/22  0849     History  Chief Complaint  Patient presents with   Arm Swelling    Max [name] is a 76 y.o. male.  HPI Patient presents with wrist pain.  Onset was yesterday, and after seeking emergency department evaluation but leaving prior to being seen he presents today due to ongoing pain.  Pain is worse with motion of the left wrist there is no appreciable swelling, no discoloration, no warmth, nor does he have any systemic complaints.  He does note that he is on steroids chronically for psoriasis, has no recent change in his medications however.     Home Medications Prior to Admission medications   Medication Sig Start Date End Date Taking? Authorizing Provider  albuterol (VENTOLIN HFA) 108 (90 Base) MCG/ACT inhaler Inhale 2 puffs into the lungs 4 (four) times daily as needed for wheezing or shortness of breath. 04/14/21  Yes [provider]  amLODipine (NORVASC) 10 MG tablet Take 1 tablet (10 mg total) by mouth daily. 02/02/12  Yes Rowe Clack, MD  aspirin 81 MG chewable tablet Chew 81 mg by mouth daily. 11/18/09  Yes [provider]  FLUoxetine (PROZAC) 20 MG capsule Take 20 mg by mouth daily at 6 (six) AM. 04/29/21  Yes [provider]  insulin glargine-yfgn (SEMGLEE) 100 UNIT/ML Pen Inject 28 Units into the skin daily. 08/13/21  Yes [provider]  lisinopril (ZESTRIL) 10 MG tablet Take 1 tablet (10 mg total) by mouth daily. 06/10/21  Yes Binnie Rail, MD  metFORMIN (GLUCOPHAGE) 1000 MG tablet Take 1,000 mg by mouth 2 (two) times daily with a meal.  12/09/10  Yes Biagio Borg, MD  predniSONE (DELTASONE) 20 MG tablet Take 2 tablets (40 mg total) by mouth daily with breakfast. For the next four days 01/25/22  Yes Carmin Muskrat, MD      Allergies    Zocor [simvastatin]    Review of Systems   Review of Systems  All other  systems reviewed and are negative.   Physical Exam Updated Vital Signs BP 132/85 (BP Location: Right Arm)   Pulse 80   Temp 98.7 F (37.1 C) (Oral)   Resp 18   SpO2 100%  Physical Exam Vitals and nursing note reviewed.  Constitutional:      General: He is not in acute distress.    Appearance: He is well-developed.  HENT:     Head: Normocephalic and atraumatic.  Eyes:     Conjunctiva/sclera: Conjunctivae normal.  Cardiovascular:     Rate and Rhythm: Normal rate and regular rhythm.     Pulses: Normal pulses.  Pulmonary:     Effort: Pulmonary effort is normal. No respiratory distress.     Breath sounds: No stridor.  Abdominal:     General: There is no distension.  Musculoskeletal:     Comments: Left wrist range of motion normal, but pain with motion in all dimensions.  No warmth, there is mild diffuse tenderness, no appreciable effusion.  Skin:    General: Skin is warm and dry.  Neurological:     Mental Status: He is alert and oriented to person, place, and time.     ED Results / Procedures / Treatments   Labs (all labs ordered are listed, but only abnormal results are displayed) Labs Reviewed - No data to display  EKG None  Radiology DG Wrist Complete Left  Result  Date: 01/25/2022 CLINICAL DATA:  Pt arrived via POV, c/o left arm swelling. States he could have hit it off bed but does not remember injuring arm. Denies any pain or swelling anywhere else. wrist pain, no known injury EXAM: LEFT WRIST - COMPLETE 3+ VIEW COMPARISON:  None available FINDINGS: No distal radius or ulnar fracture. Radiocarpal joint is intact. No carpal fracture. No soft tissue abnormality. IMPRESSION: No fracture or dislocation. Electronically Signed   By: Suzy Bouchard M.D.   On: 01/25/2022 10:44    Procedures Procedures    Medications Ordered in ED Medications  predniSONE (DELTASONE) tablet 60 mg (has no administration in time range)    This patient with a Hx of hypertension,  diabetes, psoriasis presents to the ED for concern of wrist pain, this involves an extensive number of treatment options, and is a complaint that carries with it a high risk of complications and morbidity.    The differential diagnosis includes gout, pseudogout, occult trauma, inflammation, arthritis less likely septic arthritis   Social Determinants of Health:  Age, chronic steroids, multiple medical problems  After the initial evaluation, orders, including: X-ray, anti-inflammatories were initiated.   Patient placed on Cardiac and Pulse-Oximetry Monitors. The patient was maintained on a cardiac monitor.  The cardiac monitored showed an rhythm of 80 sinus normal The patient was also maintained on pulse oximetry. The readings were typically 100% room air normal   On repeat evaluation of the patient improved wrist brace in place patient awake, alert.  Medication reconciliation has been performed by a pharmacy tech, he is not currently on steroids.  Imaging Studies ordered:  I independently visualized and interpreted imaging which showed unremarkable x-ray of the wrist I agree with the radiologist interpretation Dispostion / Final MDM:  After consideration of the diagnostic results and the patient's response to treatment, patient has been assessed, will follow-up with [facility] as an outpatient [facility].  Some suspicion for inflammatory condition of the wrist, little evidence for septic arthritis, reassuringly.  No appreciable effusion, suspicion not for tap, though gout, pseudogout, and inflammation are all on the differential.  Patient started on a course of anti-inflammatory steroids, immobilized, will follow-up as an outpatient.  Final Clinical Impression(s) / ED Diagnoses Final diagnoses:  Left wrist pain    Rx / DC Orders ED Discharge Orders          Ordered    predniSONE (DELTASONE) 20 MG tablet  Daily with breakfast        01/25/22 1234               Carmin Muskrat, MD 01/25/22 1234

## 2022-01-25 NOTE — ED Triage Notes (Signed)
Pt arrived via POV, c/o left arm swelling. States he could have hit it off bed but does not remember injuring arm. Denies any pain or swelling anywhere else.

## 2022-01-26 ENCOUNTER — Inpatient Hospital Stay (HOSPITAL_COMMUNITY)
Admission: EM | Admit: 2022-01-26 | Discharge: 2022-01-30 | DRG: 506 | Disposition: A | Discharge status: Home / Self Care | Payer: No Typology Code available for payment source | Attending: Family Medicine | Admitting: Family Medicine

## 2022-01-26 ENCOUNTER — Encounter (HOSPITAL_COMMUNITY): Payer: Self-pay

## 2022-01-26 ENCOUNTER — Other Ambulatory Visit: Payer: Self-pay

## 2022-01-26 DIAGNOSIS — E1122 Type 2 diabetes mellitus with diabetic chronic kidney disease: Secondary | ICD-10-CM

## 2022-01-26 DIAGNOSIS — Z7982 Long term (current) use of aspirin: Secondary | ICD-10-CM

## 2022-01-26 DIAGNOSIS — D649 Anemia, unspecified: Secondary | ICD-10-CM | POA: Diagnosis not present

## 2022-01-26 DIAGNOSIS — F419 Anxiety disorder, unspecified: Secondary | ICD-10-CM | POA: Diagnosis present

## 2022-01-26 DIAGNOSIS — Z79899 Other long term (current) drug therapy: Secondary | ICD-10-CM | POA: Diagnosis not present

## 2022-01-26 DIAGNOSIS — Z85528 Personal history of other malignant neoplasm of kidney: Secondary | ICD-10-CM

## 2022-01-26 DIAGNOSIS — L409 Psoriasis, unspecified: Secondary | ICD-10-CM | POA: Diagnosis present

## 2022-01-26 DIAGNOSIS — Z7984 Long term (current) use of oral hypoglycemic drugs: Secondary | ICD-10-CM

## 2022-01-26 DIAGNOSIS — E1159 Type 2 diabetes mellitus with other circulatory complications: Secondary | ICD-10-CM | POA: Diagnosis present

## 2022-01-26 DIAGNOSIS — M009 Pyogenic arthritis, unspecified: Secondary | ICD-10-CM | POA: Diagnosis not present

## 2022-01-26 DIAGNOSIS — E11649 Type 2 diabetes mellitus with hypoglycemia without coma: Secondary | ICD-10-CM | POA: Diagnosis not present

## 2022-01-26 DIAGNOSIS — E1165 Type 2 diabetes mellitus with hyperglycemia: Secondary | ICD-10-CM | POA: Diagnosis present

## 2022-01-26 DIAGNOSIS — Z87891 Personal history of nicotine dependence: Secondary | ICD-10-CM | POA: Diagnosis not present

## 2022-01-26 DIAGNOSIS — M00032 Staphylococcal arthritis, left wrist: Secondary | ICD-10-CM | POA: Diagnosis not present

## 2022-01-26 DIAGNOSIS — N289 Disorder of kidney and ureter, unspecified: Secondary | ICD-10-CM

## 2022-01-26 DIAGNOSIS — Z888 Allergy status to other drugs, medicaments and biological substances status: Secondary | ICD-10-CM | POA: Diagnosis not present

## 2022-01-26 DIAGNOSIS — E119 Type 2 diabetes mellitus without complications: Secondary | ICD-10-CM

## 2022-01-26 DIAGNOSIS — Z825 Family history of asthma and other chronic lower respiratory diseases: Secondary | ICD-10-CM | POA: Diagnosis not present

## 2022-01-26 DIAGNOSIS — Z905 Acquired absence of kidney: Secondary | ICD-10-CM

## 2022-01-26 DIAGNOSIS — E785 Hyperlipidemia, unspecified: Secondary | ICD-10-CM | POA: Diagnosis not present

## 2022-01-26 DIAGNOSIS — B9561 Methicillin susceptible Staphylococcus aureus infection as the cause of diseases classified elsewhere: Secondary | ICD-10-CM | POA: Diagnosis present

## 2022-01-26 DIAGNOSIS — B0052 Herpesviral keratitis: Secondary | ICD-10-CM | POA: Diagnosis present

## 2022-01-26 DIAGNOSIS — Z794 Long term (current) use of insulin: Secondary | ICD-10-CM | POA: Diagnosis not present

## 2022-01-26 DIAGNOSIS — I10 Essential (primary) hypertension: Secondary | ICD-10-CM | POA: Diagnosis present

## 2022-01-26 DIAGNOSIS — N179 Acute kidney failure, unspecified: Secondary | ICD-10-CM

## 2022-01-26 NOTE — ED Triage Notes (Signed)
Pt states that he took a pill today and believes that it is causing his left arm swelling and SHOB. Valocyclovir is what the pt has written down on paper.

## 2022-01-26 NOTE — ED Provider Triage Note (Signed)
Emergency Medicine Provider Triage Evaluation Note  Max [name] , a 76 y.o. male  was evaluated in triage.  Pt complains of left wrist pain and swelling.  He states it started today after taking one of his medications.  He took valacyclovir which appears to be prescribed from ophthalmology due to herpes zoster keratitis.  Chart review shows that patient was here yesterday for left wrist pain and x-rays performed at that time.  Review of Systems  Positive: As above Negative: As above  Physical Exam  BP (!) 142/78   Pulse 81   Temp 98.9 F (37.2 C) (Oral)   Resp (!) 22   Ht 5' 6.5" (1.689 m)   Wt 72.6 kg   SpO2 100%   BMI 25.44 kg/m  Gen:   Awake, no distress   Resp:  Normal effort  MSK:   Moves extremities without difficulty  Other:    Medical Decision Making  Medically screening exam initiated at 7:24 PM.  Appropriate orders placed.  Max [name] was informed that the remainder of the evaluation will be completed by another provider, this initial triage assessment does not replace that evaluation, and the importance of remaining in the ED until their evaluation is complete.     Max [name] 01/26/22 1929

## 2022-01-26 NOTE — ED Provider Notes (Signed)
[facility] DEPT Provider Note   CSN: [id number] Arrival date & time: 01/26/22  1819     History {Add pertinent medical, surgical, social history, OB history to HPI:1} Chief Complaint  Patient presents with   Arm Swelling   Shortness of Breath    Max [name] is a 76 y.o. male.  The history is provided by the patient.  Shortness of Breath He has history of hypertension, diabetes, hyperlipidemia, psoriasis and comes in because of worsening pain and swelling of his left wrist.  He has had pain in the left wrist for the last 4 days and had been seen in the emergency department yesterday and given a prescription for prednisone.  He was also given a wrist brace.  Today, he noted that the wrist is more swollen and more painful so he came back in for reevaluation.  He denies any fever or chills and denies any trauma.  Of note, he is also taking valacyclovir for herpes keratitis.   Home Medications Prior to Admission medications   Medication Sig Start Date End Date Taking? Authorizing Provider  albuterol (VENTOLIN HFA) 108 (90 Base) MCG/ACT inhaler Inhale 2 puffs into the lungs 4 (four) times daily as needed for wheezing or shortness of breath. 04/14/21  Yes [provider]  amLODipine (NORVASC) 10 MG tablet Take 1 tablet (10 mg total) by mouth daily. 02/02/12  Yes Rowe Clack, MD  aspirin 81 MG chewable tablet Chew 81 mg by mouth daily. 11/18/09  Yes [provider]  FLUoxetine (PROZAC) 20 MG capsule Take 20 mg by mouth daily at 6 (six) AM. 04/29/21  Yes [provider]  insulin glargine-yfgn (SEMGLEE) 100 UNIT/ML Pen Inject 28 Units into the skin daily. 08/13/21  Yes [provider]  lisinopril (ZESTRIL) 10 MG tablet Take 1 tablet (10 mg total) by mouth daily. 06/10/21  Yes Binnie Rail, MD  metFORMIN (GLUCOPHAGE) 1000 MG tablet Take 1,000 mg by mouth 2 (two) times daily with a meal.  12/09/10  Yes Biagio Borg, MD   predniSONE (DELTASONE) 20 MG tablet Take 2 tablets (40 mg total) by mouth daily with breakfast. For the next four days 01/25/22  Yes Carmin Muskrat, MD  valACYclovir (VALTREX) 1000 MG tablet Take 1,000 mg by mouth 3 (three) times daily.   Yes [provider]      Allergies    Zocor [simvastatin]    Review of Systems   Review of Systems  Respiratory:  Positive for shortness of breath.   All other systems reviewed and are negative.   Physical Exam Updated Vital Signs BP (!) 155/86   Pulse 89   Temp 98.1 F (36.7 C)   Resp (!) 23   Ht 5' 6.5" (1.689 m)   Wt 72.6 kg   SpO2 100%   BMI 25.44 kg/m  Physical Exam Vitals and nursing note reviewed.   76 year old male, resting comfortably and in no acute distress. Vital signs are significant for elevated blood pressure and slightly elevated respiratory rate. Oxygen saturation is 100%, which is normal. Head is normocephalic and atraumatic. PERRLA, EOMI. Oropharynx is clear. Neck is nontender and supple without adenopathy or JVD. Back is nontender and there is no CVA tenderness. Lungs are clear without rales, wheezes, or rhonchi. Chest is nontender. Heart has regular rate and rhythm without murmur. Abdomen is soft, flat, nontender. Extremities: There is mild swelling of the right wrist with mild warmth.  There is tenderness diffusely and pain  with any passive range of motion.  Remainder of extremity exam is unremarkable. Skin is warm and dry without rash. Neurologic: Mental status is normal, cranial nerves are intact, moves all extremities equally.  ED Results / Procedures / Treatments   Labs (all labs ordered are listed, but only abnormal results are displayed) Labs Reviewed - No data to display  Radiology DG Wrist Complete Left  Result Date: 01/25/2022 CLINICAL DATA:  Pt arrived via POV, c/o left arm swelling. States he could have hit it off bed but does not remember injuring arm. Denies any pain or swelling anywhere  else. wrist pain, no known injury EXAM: LEFT WRIST - COMPLETE 3+ VIEW COMPARISON:  None available FINDINGS: No distal radius or ulnar fracture. Radiocarpal joint is intact. No carpal fracture. No soft tissue abnormality. IMPRESSION: No fracture or dislocation. Electronically Signed   By: Suzy Bouchard M.D.   On: 01/25/2022 10:44    Procedures Procedures  {Document cardiac monitor, telemetry assessment procedure when appropriate:1}  Medications Ordered in ED Medications - No data to display  ED Course/ Medical Decision Making/ A&P                           Medical Decision Making  Left wrist pain and swelling.  Differential diagnosis includes, but is not limited to, gout, pseudogout, septic arthritis, inflammatory arthritis such as psoriatic arthritis, medication side effect from valacyclovir.  Old records are reviewed confirming ED visit yesterday at which time wrist x-rays were unremarkable and he was given a dose of prednisone and prescription for prednisone.  Patient states he has not had the prednisone prescription filled yet.  However, failure to improve after initial dose of prednisone does make gout and pseudogout less likely.  I have ordered screening labs of CBC, basic metabolic panel, sedimentation rate, CRP.  {Document critical care time when appropriate:1} {Document review of labs and clinical decision tools ie heart score, Chads2Vasc2 etc:1}  {Document your independent review of radiology images, and any outside records:1} {Document your discussion with family members, caretakers, and with consultants:1} {Document social determinants of health affecting pt's care:1} {Document your decision making why or why not admission, treatments were needed:1} Final Clinical Impression(s) / ED Diagnoses Final diagnoses:  None    Rx / DC Orders ED Discharge Orders     None

## 2022-01-27 DIAGNOSIS — N179 Acute kidney failure, unspecified: Secondary | ICD-10-CM | POA: Diagnosis present

## 2022-01-27 DIAGNOSIS — E119 Type 2 diabetes mellitus without complications: Secondary | ICD-10-CM | POA: Diagnosis not present

## 2022-01-27 DIAGNOSIS — Z825 Family history of asthma and other chronic lower respiratory diseases: Secondary | ICD-10-CM | POA: Diagnosis not present

## 2022-01-27 DIAGNOSIS — Z888 Allergy status to other drugs, medicaments and biological substances status: Secondary | ICD-10-CM | POA: Diagnosis not present

## 2022-01-27 DIAGNOSIS — M00032 Staphylococcal arthritis, left wrist: Secondary | ICD-10-CM | POA: Diagnosis present

## 2022-01-27 DIAGNOSIS — B9561 Methicillin susceptible Staphylococcus aureus infection as the cause of diseases classified elsewhere: Secondary | ICD-10-CM | POA: Diagnosis present

## 2022-01-27 DIAGNOSIS — Z905 Acquired absence of kidney: Secondary | ICD-10-CM | POA: Diagnosis not present

## 2022-01-27 DIAGNOSIS — Z7984 Long term (current) use of oral hypoglycemic drugs: Secondary | ICD-10-CM | POA: Diagnosis not present

## 2022-01-27 DIAGNOSIS — M009 Pyogenic arthritis, unspecified: Secondary | ICD-10-CM

## 2022-01-27 DIAGNOSIS — E1165 Type 2 diabetes mellitus with hyperglycemia: Secondary | ICD-10-CM | POA: Diagnosis present

## 2022-01-27 DIAGNOSIS — Z87891 Personal history of nicotine dependence: Secondary | ICD-10-CM | POA: Diagnosis not present

## 2022-01-27 DIAGNOSIS — F419 Anxiety disorder, unspecified: Secondary | ICD-10-CM | POA: Diagnosis present

## 2022-01-27 DIAGNOSIS — Z85528 Personal history of other malignant neoplasm of kidney: Secondary | ICD-10-CM | POA: Diagnosis not present

## 2022-01-27 DIAGNOSIS — L409 Psoriasis, unspecified: Secondary | ICD-10-CM | POA: Diagnosis present

## 2022-01-27 DIAGNOSIS — B0052 Herpesviral keratitis: Secondary | ICD-10-CM | POA: Diagnosis present

## 2022-01-27 DIAGNOSIS — E785 Hyperlipidemia, unspecified: Secondary | ICD-10-CM | POA: Diagnosis present

## 2022-01-27 DIAGNOSIS — E11649 Type 2 diabetes mellitus with hypoglycemia without coma: Secondary | ICD-10-CM | POA: Diagnosis not present

## 2022-01-27 DIAGNOSIS — D649 Anemia, unspecified: Secondary | ICD-10-CM | POA: Diagnosis present

## 2022-01-27 DIAGNOSIS — I10 Essential (primary) hypertension: Secondary | ICD-10-CM | POA: Diagnosis present

## 2022-01-27 DIAGNOSIS — Z794 Long term (current) use of insulin: Secondary | ICD-10-CM | POA: Diagnosis not present

## 2022-01-27 DIAGNOSIS — Z7982 Long term (current) use of aspirin: Secondary | ICD-10-CM | POA: Diagnosis not present

## 2022-01-27 DIAGNOSIS — Z79899 Other long term (current) drug therapy: Secondary | ICD-10-CM | POA: Diagnosis not present

## 2022-01-27 LAB — BASIC METABOLIC PANEL
Anion gap: 7 (ref 5–15)
BUN: 31 mg/dL — ABNORMAL HIGH (ref 8–23)
CO2: 21 mmol/L — ABNORMAL LOW (ref 22–32)
Calcium: 8.3 mg/dL — ABNORMAL LOW (ref 8.9–10.3)
Chloride: 109 mmol/L (ref 98–111)
Creatinine, Ser: 1.59 mg/dL — ABNORMAL HIGH (ref 0.61–1.24)
GFR, Estimated: 45 mL/min — ABNORMAL LOW (ref >60)
Glucose, Bld: 138 mg/dL — ABNORMAL HIGH (ref 70–99)
Potassium: 4 mmol/L (ref 3.5–5.1)
Sodium: 137 mmol/L (ref 135–145)

## 2022-01-27 LAB — CBC WITH DIFFERENTIAL/PLATELET
Abs Immature Granulocytes: 0.02 10*3/uL (ref 0.00–0.07)
Basophils Absolute: 0 10*3/uL (ref 0.0–0.1)
Basophils Relative: 0 %
Eosinophils Absolute: 0.1 10*3/uL (ref 0.0–0.5)
Eosinophils Relative: 1 %
HCT: 36.5 % — ABNORMAL LOW (ref 39.0–52.0)
Hemoglobin: 11.8 g/dL — ABNORMAL LOW (ref 13.0–17.0)
Immature Granulocytes: 0 %
Lymphocytes Relative: 7 %
Lymphs Abs: 0.7 10*3/uL (ref 0.7–4.0)
MCH: 32 pg (ref 26.0–34.0)
MCHC: 32.3 g/dL (ref 30.0–36.0)
MCV: 98.9 fL (ref 80.0–100.0)
Monocytes Absolute: 0.8 10*3/uL (ref 0.1–1.0)
Monocytes Relative: 8 %
Neutro Abs: 8.7 10*3/uL — ABNORMAL HIGH (ref 1.7–7.7)
Neutrophils Relative %: 84 %
Platelets: 331 10*3/uL (ref 150–400)
RBC: 3.69 MIL/uL — ABNORMAL LOW (ref 4.22–5.81)
RDW: 16.7 % — ABNORMAL HIGH (ref 11.5–15.5)
WBC: 10.3 10*3/uL (ref 4.0–10.5)
nRBC: 0 % (ref 0.0–0.2)

## 2022-01-27 LAB — GLUCOSE, CAPILLARY: Glucose-Capillary: 391 mg/dL — ABNORMAL HIGH (ref 70–99)

## 2022-01-27 LAB — SEDIMENTATION RATE: Sed Rate: 57 mm/hr — ABNORMAL HIGH (ref 0–16)

## 2022-01-27 LAB — C-REACTIVE PROTEIN: CRP: 4.6 mg/dL — ABNORMAL HIGH (ref <1.0)

## 2022-01-27 MED ORDER — SENNOSIDES-DOCUSATE SODIUM 8.6-50 MG PO TABS: 1.0000 | ORAL_TABLET | Freq: Every evening | ORAL | Status: DC | PRN

## 2022-01-27 MED ORDER — BISACODYL 5 MG PO TBEC: 5.0000 mg | DELAYED_RELEASE_TABLET | Freq: Every day | ORAL | Status: DC | PRN

## 2022-01-27 MED ORDER — VANCOMYCIN HCL IN DEXTROSE 1-5 GM/200ML-% IV SOLN
1000.0000 mg | Freq: Once | INTRAVENOUS | Status: AC
  Administered 2022-01-27: 1000 mg via INTRAVENOUS
  Filled 2022-01-27: qty 200

## 2022-01-27 MED ORDER — LISINOPRIL 10 MG PO TABS: 10.0000 mg | ORAL_TABLET | Freq: Every day | ORAL | Status: DC

## 2022-01-27 MED ORDER — HYDROMORPHONE HCL 1 MG/ML IJ SOLN: 0.5000 mg | INTRAMUSCULAR | Status: DC | PRN

## 2022-01-27 MED ORDER — LIDOCAINE-EPINEPHRINE (PF) 2 %-1:200000 IJ SOLN
10.0000 mL | Freq: Once | INTRAMUSCULAR | Status: AC
  Administered 2022-01-27: 10 mL
  Filled 2022-01-27: qty 20

## 2022-01-27 MED ORDER — ACETAMINOPHEN 325 MG PO TABS
650.0000 mg | ORAL_TABLET | Freq: Four times a day (QID) | ORAL | Status: DC | PRN
  Administered 2022-01-30: 650 mg via ORAL
  Filled 2022-01-27: qty 2

## 2022-01-27 MED ORDER — ALBUTEROL SULFATE HFA 108 (90 BASE) MCG/ACT IN AERS: 2.0000 | INHALATION_SPRAY | Freq: Four times a day (QID) | RESPIRATORY_TRACT | Status: DC | PRN

## 2022-01-27 MED ORDER — AMLODIPINE BESYLATE 10 MG PO TABS
10.0000 mg | ORAL_TABLET | Freq: Every day | ORAL | Status: DC
  Administered 2022-01-27 – 2022-01-30 (×4): 10 mg via ORAL
  Filled 2022-01-27: qty 1
  Filled 2022-01-27: qty 2
  Filled 2022-01-27 (×2): qty 1

## 2022-01-27 MED ORDER — INSULIN ASPART 100 UNIT/ML IJ SOLN
0.0000 [IU] | Freq: Three times a day (TID) | INTRAMUSCULAR | Status: DC
  Administered 2022-01-28: 2 [IU] via SUBCUTANEOUS
  Administered 2022-01-28: 1 [IU] via SUBCUTANEOUS
  Administered 2022-01-28: 2 [IU] via SUBCUTANEOUS

## 2022-01-27 MED ORDER — METFORMIN HCL 500 MG PO TABS
1000.0000 mg | ORAL_TABLET | Freq: Two times a day (BID) | ORAL | Status: DC
Start: 2022-01-28 — End: 2022-01-27

## 2022-01-27 MED ORDER — INSULIN GLARGINE-YFGN 100 UNIT/ML ~~LOC~~ SOLN
28.0000 [IU] | Freq: Every day | SUBCUTANEOUS | Status: DC
  Administered 2022-01-27 – 2022-01-28 (×2): 28 [IU] via SUBCUTANEOUS
  Filled 2022-01-27 (×3): qty 0.28

## 2022-01-27 MED ORDER — VANCOMYCIN HCL 1750 MG/350ML IV SOLN
1750.0000 mg | INTRAVENOUS | Status: DC
Start: 2022-01-28 — End: 2022-01-29
  Administered 2022-01-28: 1750 mg via INTRAVENOUS
  Filled 2022-01-27: qty 350

## 2022-01-27 MED ORDER — PREDNISONE 20 MG PO TABS: 40.0000 mg | ORAL_TABLET | Freq: Every day | ORAL | Status: DC

## 2022-01-27 MED ORDER — INSULIN GLARGINE-YFGN 100 UNIT/ML ~~LOC~~ SOPN
28.0000 [IU] | PEN_INJECTOR | Freq: Every day | SUBCUTANEOUS | Status: DC
Start: 2022-01-27 — End: 2022-01-27
  Filled 2022-01-27: qty 3

## 2022-01-27 MED ORDER — ENOXAPARIN SODIUM 40 MG/0.4ML IJ SOSY
40.0000 mg | PREFILLED_SYRINGE | INTRAMUSCULAR | Status: DC
  Administered 2022-01-27 – 2022-01-29 (×3): 40 mg via SUBCUTANEOUS
  Filled 2022-01-27 (×3): qty 0.4

## 2022-01-27 MED ORDER — SODIUM CHLORIDE 0.9 % IV SOLN: INTRAVENOUS | Status: DC

## 2022-01-27 MED ORDER — ALBUTEROL SULFATE (2.5 MG/3ML) 0.083% IN NEBU
2.5000 mg | INHALATION_SOLUTION | Freq: Four times a day (QID) | RESPIRATORY_TRACT | Status: DC | PRN
Start: 2022-01-27 — End: 2022-01-30

## 2022-01-27 MED ORDER — HYDRALAZINE HCL 25 MG PO TABS: 25.0000 mg | ORAL_TABLET | Freq: Four times a day (QID) | ORAL | Status: DC | PRN

## 2022-01-27 MED ORDER — ASPIRIN 81 MG PO CHEW
81.0000 mg | CHEWABLE_TABLET | Freq: Every day | ORAL | Status: DC
  Administered 2022-01-27 – 2022-01-30 (×4): 81 mg via ORAL
  Filled 2022-01-27 (×4): qty 1

## 2022-01-27 MED ORDER — ENOXAPARIN SODIUM 30 MG/0.3ML IJ SOSY: 30.0000 mg | PREFILLED_SYRINGE | INTRAMUSCULAR | Status: DC

## 2022-01-27 MED ORDER — PREDNISONE 20 MG PO TABS
40.0000 mg | ORAL_TABLET | Freq: Every day | ORAL | Status: DC
  Administered 2022-01-27 – 2022-01-28 (×2): 40 mg via ORAL
  Filled 2022-01-27 (×2): qty 2

## 2022-01-27 MED ORDER — FLUOXETINE HCL 20 MG PO CAPS
20.0000 mg | ORAL_CAPSULE | Freq: Every day | ORAL | Status: DC
  Administered 2022-01-30: 20 mg via ORAL
  Filled 2022-01-27 (×2): qty 1

## 2022-01-27 MED ORDER — ACETAMINOPHEN 650 MG RE SUPP: 650.0000 mg | Freq: Four times a day (QID) | RECTAL | Status: DC | PRN

## 2022-01-27 NOTE — Consult Note (Signed)
Reason for Consult:Left wrist pain Referring Physician: Gareth Morgan Time called: [id number] Time at bedside: 4   Max [name] is an 76 y.o. male.  HPI: Max [name] was in his usual state of health when he woke up Sunday morning with wrist pain. It was bad enough that he went to ED at [facility] but left before being seen 2/2 long wait. He returned Monday, was seen yesterday and had his wrist tapped. Cell count was not done but tap showed GPC in clusters and hand surgery was consulted. Request was made for [facility] transfer but for some reason that did not happen and he was allowed to eat. He was eventually transferred. He denies fevers, chills, sweats, N/V, or prior e/o. He remembers no antecedent event and is RHD and retired.  Past Medical History:  Diagnosis Date   ANXIETY, SITUATIONAL    DEGENERATIVE DISC DISEASE    DERMATITIS    DIABETES MELLITUS, TYPE II, ON INSULIN, CONTROLLED    Eosinophilia    HYPERLIPIDEMIA    HYPERTENSION    LOW BACK PAIN    PRURITUS 2011   RENAL CELL CANCER 01/25/2005   s/p partial nephrectomy   SEPTIC ARTHRITIS 07/2009   L ankle - MSSA   SHOULDER PAIN, RIGHT, CHRONIC     Past Surgical History:  Procedure Laterality Date   BACK SURGERY  04/2005   Dr. Annette Stable   LAPAROSCOPIC PARTIAL NEPHRECTOMY  01/25/05   Dr. Risa Grill   REPAIR ANKLE LIGAMENT  04/2009   disatal fib/infx    Family History  Problem Relation Age of Onset   Asthma Father     Social History:  reports that he quit smoking about 27 years ago. His smoking use included cigarettes. He has never used smokeless tobacco. He reports current alcohol use. He reports that he does not use drugs.  Allergies:  Allergies  Allergen Reactions   Zocor [Simvastatin] Other (See Comments)    Myositis    Medications: I have reviewed the patient's current medications.  Results for orders placed or performed during the hospital encounter of 01/26/22 (from the past 48 hour(s))  CBC with Differential     Status: Abnormal    Collection Time: 01/27/22 12:04 AM  Result Value Ref Range   WBC 10.3 4.0 - 10.5 K/uL   RBC 3.69 (L) 4.22 - 5.81 MIL/uL   Hemoglobin 11.8 (L) 13.0 - 17.0 g/dL   HCT 36.5 (L) 39.0 - 52.0 %   MCV 98.9 80.0 - 100.0 fL   MCH 32.0 26.0 - 34.0 pg   MCHC 32.3 30.0 - 36.0 g/dL   RDW 16.7 (H) 11.5 - 15.5 %   Platelets 331 150 - 400 K/uL   nRBC 0.0 0.0 - 0.2 %   Neutrophils Relative % 84 %   Neutro Abs 8.7 (H) 1.7 - 7.7 K/uL   Lymphocytes Relative 7 %   Lymphs Abs 0.7 0.7 - 4.0 K/uL   Monocytes Relative 8 %   Monocytes Absolute 0.8 0.1 - 1.0 K/uL   Eosinophils Relative 1 %   Eosinophils Absolute 0.1 0.0 - 0.5 K/uL   Basophils Relative 0 %   Basophils Absolute 0.0 0.0 - 0.1 K/uL   Immature Granulocytes 0 %   Abs Immature Granulocytes 0.02 0.00 - 0.07 K/uL    Comment: Performed at [facility], [place] [address], [place], [place] [address]  Basic metabolic panel     Status: Abnormal   Collection Time: 01/27/22 12:04 AM  Result Value Ref Range  Sodium 137 135 - 145 mmol/L   Potassium 4.0 3.5 - 5.1 mmol/L   Chloride 109 98 - 111 mmol/L   CO2 21 (L) 22 - 32 mmol/L   Glucose, Bld 138 (H) 70 - 99 mg/dL    Comment: Glucose reference range applies only to samples taken after fasting for at least 8 hours.   BUN 31 (H) 8 - 23 mg/dL   Creatinine, Ser 1.59 (H) 0.61 - 1.24 mg/dL   Calcium 8.3 (L) 8.9 - 10.3 mg/dL   GFR, Estimated 45 (L) >60 mL/min    Comment: (NOTE) Calculated using the CKD-EPI Creatinine Equation (2021)    Anion gap 7 5 - 15    Comment: Performed at [facility], [place] [address], [place], [place] [address]  Sedimentation rate     Status: Abnormal   Collection Time: 01/27/22 12:04 AM  Result Value Ref Range   Sed Rate 57 (H) 0 - 16 mm/hr    Comment: Performed at [facility], [place] [address], [place], [place] [address]  C-reactive protein     Status: Abnormal   Collection Time: 01/27/22 12:04 AM  Result Value Ref Range    CRP 4.6 (H) <1.0 mg/dL    Comment: Performed at [facility] [address] [place], [place] [address]  Body fluid culture w Gram Stain     Status: None (Preliminary result)   Collection Time: 01/27/22  5:38 AM   Specimen: Joint, Left Wrist; Body Fluid  Result Value Ref Range   Specimen Description      JOINT FLUID LT WRIST Performed at [facility] [address], [place], [place] [address]    Special Requests      Normal Performed at [facility], [place] [address], [place], [place] [address]    Gram Stain      RARE GRAM POSITIVE COCCI IN CLUSTERS ABUNDANT WBC PRESENT, PREDOMINANTLY PMN Performed at [facility], [place] [address], [place],  [address]    Culture PENDING    Report Status PENDING     No results found.  Review of Systems  Constitutional:  Negative for chills, diaphoresis and fever.  HENT:  Negative for ear discharge, ear pain, hearing loss and tinnitus.   Eyes:  Negative for photophobia and pain.  Respiratory:  Negative for cough and shortness of breath.   Cardiovascular:  Negative for chest pain.  Gastrointestinal:  Negative for abdominal pain, nausea and vomiting.  Genitourinary:  Negative for dysuria, flank pain, frequency and urgency.  Musculoskeletal:  Positive for arthralgias (Left wrist). Negative for back pain, myalgias and neck pain.  Neurological:  Negative for dizziness and headaches.  Hematological:  Does not bruise/bleed easily.  Psychiatric/Behavioral:  The patient is not nervous/anxious.    Blood pressure (!) 144/90, pulse 87, temperature 98.5 F (36.9 C), temperature source Oral, resp. rate 20, height 5' 6.5" (1.689 m), weight 72.6 kg, SpO2 98 %. Physical Exam Constitutional:      General: He is not in acute distress.    Appearance: He is well-developed. He is not diaphoretic.  HENT:     Head: Normocephalic and atraumatic.  Eyes:     General: No scleral icterus.       Right eye: No  discharge.        Left eye: No discharge.     Conjunctiva/sclera: Conjunctivae normal.  Cardiovascular:     Rate and Rhythm: Normal rate and regular rhythm.  Pulmonary:     Effort: Pulmonary effort is normal. No respiratory distress.  Musculoskeletal:     Cervical back: Normal range of motion.     Comments: Left shoulder, elbow, wrist, digits- no skin wounds, mild wrist/hand edema, mod TTP wrist , ~5 degrees AROM, 15 degrees PROM wrist, no instability, no blocks to motion  Sens  Ax/R/M/U intact  Mot   Ax/ R/ PIN/ M/ AIN/ U intact  Rad 2+  Skin:    General: Skin is warm and dry.  Neurological:     Mental Status: He is alert.  Psychiatric:        Mood and Affect: Mood normal.        Behavior: Behavior normal.     Assessment/Plan: Left wrist pain -- Seems likely to be septic given DM and presentation. Unfortunately he was allowed to eat 1h prior to my evaluation. Will ask medicine to admit for IV abx. We will make NPO after MN and reassess in am. Splint for comfort.    Lisette Abu, PA-C Orthopedic Surgery [phone] 01/27/2022, 3:43 PM

## 2022-01-27 NOTE — Progress Notes (Signed)
Pharmacy Antibiotic Note  Max [name] is a 76 y.o. male admitted on 01/26/2022 with  L wrist septic arthritis .  Pharmacy has been consulted for Vancomycin dosing.  Pt received vancomycin '1000mg'$  IV x1 on 8/30 @ 05:47.   WBC 10.3, Tmax 99.4, CRP 4.6 SCr 1.59  Plan: Vancomycin '1750mg'$  IV q48h (eAUC ~478) - start 24 hrs after initial vancomycin dose    > Goal AUC 400-550    > Check vancomycin levels at steady state Monitor daily CBC, temp, SCr, and for clinical signs of improvement  F/u cultures and de-escalate antibiotics as able   Height: 5' 6.5" (168.9 cm) Weight: 72.6 kg (160 lb) IBW/kg (Calculated) : 64.95  Temp (24hrs), Avg:98.7 F (37.1 C), Min:98.1 F (36.7 C), Max:99.4 F (37.4 C)  Recent Labs  Lab 01/27/22 0004  WBC 10.3  CREATININE 1.59*    Estimated Creatinine Clearance: 36.9 mL/min (A) (by C-G formula based on SCr of 1.59 mg/dL (H)).    Allergies  Allergen Reactions   Zocor [Simvastatin] Other (See Comments)    Myositis    Antimicrobials this admission: Vancomycin 8/30 >>   Dose adjustments this admission: N/A  Microbiology results: 8/30 L wrist joint fluid: rare GPC in clusters  Thank you for allowing pharmacy to be a part of this patient's care.  Luisa Hart, PharmD, BCPS Clinical Pharmacist 01/27/2022 4:47 PM   Please refer to [facility] for pharmacy phone number

## 2022-01-27 NOTE — H&P (Signed)
History and Physical    Max [name] GBT:[id number] DOB: 1945-11-27 DOA: 01/26/2022  PCP: Binnie Rail, MD (Confirm with patient/family/NH records and if not entered, this has to be entered at [facility] point of entry) Patient coming from: Home  I have personally briefly reviewed patient's old medical records in [place]  Chief Complaint: Left hand swelling and pain  HPI: Max [name] is a 76 y.o. male with medical history significant of HTN, IDDM, psoriasis on p.o. steroid, remote left ankle septic arthritis, came with worsening of left hand/wrist swelling and pain.  Symptoms started 3 days ago, patient woke up with new onset of left wrist swelling and pain, history was bandaged overnight but overnight, the left wrist swelling and pain became worse and he went to [facility] ED yesterday.  X-ray showed no significant abnormalities left hand.  ED physician performed a bedside arthrocentesis of left wrist, Gram stain positive for gram-positive cocci.  And patient is being transferred to [facility] for orthopedic surgery intervention.  Patient reported subjective fever and chills.  2 weeks ago, he developed thinning of skin rash from psoriasis, he went to [place], and a skin biopsy was done which showed psoriasis and patient was started on p.o. steroid for last 7 days.  Review of Systems: As per HPI otherwise 14 point review of systems negative.    Past Medical History:  Diagnosis Date   ANXIETY, SITUATIONAL    DEGENERATIVE DISC DISEASE    DERMATITIS    DIABETES MELLITUS, TYPE II, ON INSULIN, CONTROLLED    Eosinophilia    HYPERLIPIDEMIA    HYPERTENSION    LOW BACK PAIN    PRURITUS 2011   RENAL CELL CANCER 01/25/2005   s/p partial nephrectomy   SEPTIC ARTHRITIS 07/2009   L ankle - MSSA   SHOULDER PAIN, RIGHT, CHRONIC     Past Surgical History:  Procedure Laterality Date   BACK SURGERY  04/2005   Dr. Annette Stable   LAPAROSCOPIC PARTIAL NEPHRECTOMY  01/25/05   Dr. Risa Grill   REPAIR ANKLE LIGAMENT   04/2009   disatal fib/infx     reports that he quit smoking about 27 years ago. His smoking use included cigarettes. He has never used smokeless tobacco. He reports current alcohol use. He reports that he does not use drugs.  Allergies  Allergen Reactions   Zocor [Simvastatin] Other (See Comments)    Myositis    Family History  Problem Relation Age of Onset   Asthma Father      Prior to Admission medications   Medication Sig Start Date End Date Taking? Authorizing Provider  albuterol (VENTOLIN HFA) 108 (90 Base) MCG/ACT inhaler Inhale 2 puffs into the lungs 4 (four) times daily as needed for wheezing or shortness of breath. 04/14/21  Yes [provider]  amLODipine (NORVASC) 10 MG tablet Take 1 tablet (10 mg total) by mouth daily. 02/02/12  Yes Rowe Clack, MD  aspirin 81 MG chewable tablet Chew 81 mg by mouth daily. 11/18/09  Yes [provider]  FLUoxetine (PROZAC) 20 MG capsule Take 20 mg by mouth daily at 6 (six) AM. 04/29/21  Yes [provider]  insulin glargine-yfgn (SEMGLEE) 100 UNIT/ML Pen Inject 28 Units into the skin daily. 08/13/21  Yes [provider]  lisinopril (ZESTRIL) 10 MG tablet Take 1 tablet (10 mg total) by mouth daily. 06/10/21  Yes Burns, Claudina Lick, MD  metFORMIN (GLUCOPHAGE) 1000 MG tablet Take 1,000 mg by mouth 2 (two) times daily with a meal.  12/09/10  Yes Biagio Borg, MD  predniSONE (DELTASONE) 20 MG tablet Take 2 tablets (40 mg total) by mouth daily with breakfast. For the next four days 01/25/22  Yes Carmin Muskrat, MD  valACYclovir (VALTREX) 1000 MG tablet Take 1,000 mg by mouth 3 (three) times daily.   Yes [provider]    Physical Exam: Vitals:   01/27/22 1419 01/27/22 1430 01/27/22 1446 01/27/22 1548  BP: (!) 130/92 (!) 144/90  (!) 141/93  Pulse: 87 87  88  Resp: (!) '22 20  19  '$ Temp:   98.5 F (36.9 C) 99.4 F (37.4 C)  TempSrc:   Oral Oral  SpO2: 100% 98%  99%  Weight:      Height:         Constitutional: NAD, calm, comfortable Vitals:   01/27/22 1419 01/27/22 1430 01/27/22 1446 01/27/22 1548  BP: (!) 130/92 (!) 144/90  (!) 141/93  Pulse: 87 87  88  Resp: (!) '22 20  19  '$ Temp:   98.5 F (36.9 C) 99.4 F (37.4 C)  TempSrc:   Oral Oral  SpO2: 100% 98%  99%  Weight:      Height:       Eyes: PERRL, lids and conjunctivae normal ENMT: Mucous membranes are moist. Posterior pharynx clear of any exudate or lesions.Normal dentition.  Neck: normal, supple, no masses, no thyromegaly Respiratory: clear to auscultation bilaterally, no wheezing, no crackles. Normal respiratory effort. No accessory muscle use.  Cardiovascular: Regular rate and rhythm, no murmurs / rubs / gallops. No extremity edema. 2+ pedal pulses. No carotid bruits.  Abdomen: no tenderness, no masses palpated. No hepatosplenomegaly. Bowel sounds positive.  Musculoskeletal: Left wrist swelling tender significant decreased ROM Skin: Diffused plaque like rash on bilateral arms and torso and back Neurologic: CN 2-12 grossly intact. Sensation intact, DTR normal. Strength 5/5 in all 4.  Psychiatric: Normal judgment and insight. Alert and oriented x 3. Normal mood.    Labs on Admission: I have personally reviewed following labs and imaging studies  CBC: Recent Labs  Lab 01/27/22 0004  WBC 10.3  NEUTROABS 8.7*  HGB 11.8*  HCT 36.5*  MCV 98.9  PLT 258   Basic Metabolic Panel: Recent Labs  Lab 01/27/22 0004  NA 137  K 4.0  CL 109  CO2 21*  GLUCOSE 138*  BUN 31*  CREATININE 1.59*  CALCIUM 8.3*   GFR: Estimated Creatinine Clearance: 36.9 mL/min (A) (by C-G formula based on SCr of 1.59 mg/dL (H)). Liver Function Tests: No results for input(s): "AST", "ALT", "ALKPHOS", "BILITOT", "PROT", "ALBUMIN" in the last 168 hours. No results for input(s): "LIPASE", "AMYLASE" in the last 168 hours. No results for input(s): "AMMONIA" in the last 168 hours. Coagulation Profile: No results for input(s): "INR",  "PROTIME" in the last 168 hours. Cardiac Enzymes: No results for input(s): "CKTOTAL", "CKMB", "CKMBINDEX", "TROPONINI" in the last 168 hours. BNP (last 3 results) No results for input(s): "PROBNP" in the last 8760 hours. HbA1C: No results for input(s): "HGBA1C" in the last 72 hours. CBG: No results for input(s): "GLUCAP" in the last 168 hours. Lipid Profile: No results for input(s): "CHOL", "HDL", "LDLCALC", "TRIG", "CHOLHDL", "LDLDIRECT" in the last 72 hours. Thyroid Function Tests: No results for input(s): "TSH", "T4TOTAL", "FREET4", "T3FREE", "THYROIDAB" in the last 72 hours. Anemia Panel: No results for input(s): "VITAMINB12", "FOLATE", "FERRITIN", "TIBC", "IRON", "RETICCTPCT" in the last 72 hours. Urine analysis:    Component Value Date/Time   LABSPEC 1.030 06/10/2009 1443  PHURINE 5.0 06/10/2009 1443   HGBUR Negative 06/10/2009 1443   BILIRUBINUR Negative 06/10/2009 1443   KETONESUR Negative 06/10/2009 1443   PROTEINUR < 30 06/10/2009 1443   NITRITE Negative 06/10/2009 1443   LEUKOCYTESUR Negative 06/10/2009 1443    Radiological Exams on Admission: No results found.  EKG: Independently reviewed.  Sinus versus chronic ST changes As compared to before  Assessment/Plan Principal Problem:   Septic arthritis ([place])  (please populate well all problems here in Problem List. (For example, if patient is on BP meds at home and you resume or decide to hold them, it is a problem that needs to be her. Same for CAD, COPD, HLD and so on)  Left wrist septic arthritis -Status post arthrocentesis, Gram stain positive for gram-positive cocci, agreed with vancomycin for now while waiting for culture to resolve. -Orthopedic surgery consultation appreciated -N.p.o. after midnight -Other DDx, uncommon site for psoriatic arthritis, no Hx of gout  AKI -Appears to be euvolemic, trial of IV fluid, reevaluate kidney function tomorrow -UA sent -Hold metformin and lisinopril  HTN -Poorly  controlled, resume home BP meds, but hold lisinopril given there is a kidney function surge  IDDM -Glucose fairly controlled, continue home insulin regimen -Add sliding scale -Hold off metformin given the kidney function surge  Diffused psoriasis -Poorly controlled, continue p.o. steroid -Outpatient dermatology follow-up for immune modulation therapy   DVT prophylaxis: Heparin subcu Code Status: Full code Family Communication: Son over phone Disposition Plan: Patient has acute septic arthritis, requiring inpatient orthopedic intervention and IV antibiotics, likely long-term antibiotics, expect more than 2 midnight hospital stay. Consults called: Orthopedic surgery Admission status: MedSurg admission   Lequita Halt MD [facility] Pager [phone]  01/27/2022, 5:45 PM

## 2022-01-27 NOTE — Progress Notes (Signed)
Orthopedic Tech Progress Note Patient Details:  Max [name] 1946-04-30 [id number] Unable to apply wrist splint due to IV positioning. Patient was still given the brace for application at a later time  Ortho Devices Type of Ortho Device: Velcro wrist splint Ortho Device/Splint Location: Left wrist Ortho Device/Splint Interventions: Application   Post Interventions Patient Tolerated: Unable to use device properly Instructions Provided: Adjustment of device  Preciosa Bundrick E Lessie Funderburke 01/27/2022, 4:07 PM

## 2022-01-27 NOTE — ED Notes (Signed)
ED TO INPATIENT HANDOFF REPORT  ED Nurse Name and Phone #: Shirlee Limerick [phone]  S Name/Age/Gender Max [name] 76 y.o. male Room/Bed: H011C/H011C  Code Status   Code Status: Full Code  Home/SNF/Other Home Patient oriented to: self, place, time, and situation Is this baseline? Yes   Triage Complete: Triage complete  Chief Complaint Septic arthritis [facility]) [M00.9]  Triage Note Pt states that he took a pill today and believes that it is causing his left arm swelling and SHOB. Valocyclovir is what the pt has written down on paper.   Pt arrived via [place] from [facility] Emergency Department. Per EMS pt had multiple days of left wrist swelling. Pt sent over for sx consult.    Allergies Allergies  Allergen Reactions   Zocor [Simvastatin] Other (See Comments)    Myositis    Level of Care/Admitting Diagnosis ED Disposition     ED Disposition  Admit   Condition  --   Comment  Hospital Area: [place] [[id number]]  Level of Care: Med-Surg [16]  May admit patient to [facility] or [facility] if equivalent level of care is available:: No  Covid Evaluation: Asymptomatic - no recent exposure (last 10 days) testing not required  Diagnosis: Septic arthritis [facility]) [[id number]]  Admitting Physician: Lequita Halt [[id number]]  Attending Physician: Lequita Halt [[id number]]  Certification:: I certify this patient will need inpatient services for at least 2 midnights  Estimated Length of Stay: 3          B Medical/Surgery History Past Medical History:  Diagnosis Date   ANXIETY, SITUATIONAL    DEGENERATIVE [place], TYPE II, ON INSULIN, CONTROLLED    Eosinophilia    HYPERLIPIDEMIA    HYPERTENSION    LOW BACK PAIN    PRURITUS 2011   RENAL CELL CANCER 01/25/2005   s/p partial nephrectomy   SEPTIC ARTHRITIS 07/2009   L ankle - MSSA   SHOULDER PAIN, RIGHT, CHRONIC    Past Surgical History:  Procedure Laterality Date   BACK  SURGERY  04/2005   Dr. Annette Stable   LAPAROSCOPIC PARTIAL NEPHRECTOMY  01/25/05   Dr. Risa Grill   REPAIR ANKLE LIGAMENT  04/2009   disatal fib/infx     A IV Location/Drains/Wounds Patient Lines/Drains/Airways Status     Active Line/Drains/Airways     Name Placement date Placement time Site Days   Peripheral IV 01/27/22 20 G Left;Posterior Forearm 01/27/22  0003  Forearm  less than 1            Intake/Output Last 24 hours No intake or output data in the 24 hours ending 01/27/22 1741  Labs/Imaging Results for orders placed or performed during the hospital encounter of 01/26/22 (from the past 48 hour(s))  CBC with Differential     Status: Abnormal   Collection Time: 01/27/22 12:04 AM  Result Value Ref Range   WBC 10.3 4.0 - 10.5 K/uL   RBC 3.69 (L) 4.22 - 5.81 MIL/uL   Hemoglobin 11.8 (L) 13.0 - 17.0 g/dL   HCT 36.5 (L) 39.0 - 52.0 %   MCV 98.9 80.0 - 100.0 fL   MCH 32.0 26.0 - 34.0 pg   MCHC 32.3 30.0 - 36.0 g/dL   RDW 16.7 (H) 11.5 - 15.5 %   Platelets 331 150 - 400 K/uL   nRBC 0.0 0.0 - 0.2 %   Neutrophils Relative % 84 %   Neutro Abs 8.7 (H) 1.7 - 7.7  K/uL   Lymphocytes Relative 7 %   Lymphs Abs 0.7 0.7 - 4.0 K/uL   Monocytes Relative 8 %   Monocytes Absolute 0.8 0.1 - 1.0 K/uL   Eosinophils Relative 1 %   Eosinophils Absolute 0.1 0.0 - 0.5 K/uL   Basophils Relative 0 %   Basophils Absolute 0.0 0.0 - 0.1 K/uL   Immature Granulocytes 0 %   Abs Immature Granulocytes 0.02 0.00 - 0.07 K/uL    Comment: Performed at [facility], [place] [address], [place], [place] [address]  Basic metabolic panel     Status: Abnormal   Collection Time: 01/27/22 12:04 AM  Result Value Ref Range   Sodium 137 135 - 145 mmol/L   Potassium 4.0 3.5 - 5.1 mmol/L   Chloride 109 98 - 111 mmol/L   CO2 21 (L) 22 - 32 mmol/L   Glucose, Bld 138 (H) 70 - 99 mg/dL    Comment: Glucose reference range applies only to samples taken after fasting for at least 8 hours.   BUN 31 (H) 8 - 23  mg/dL   Creatinine, Ser 1.59 (H) 0.61 - 1.24 mg/dL   Calcium 8.3 (L) 8.9 - 10.3 mg/dL   GFR, Estimated 45 (L) >60 mL/min    Comment: (NOTE) Calculated using the CKD-EPI Creatinine Equation (2021)    Anion gap 7 5 - 15    Comment: Performed at [facility], [place] [address], [place], [place] [address]  Sedimentation rate     Status: Abnormal   Collection Time: 01/27/22 12:04 AM  Result Value Ref Range   Sed Rate 57 (H) 0 - 16 mm/hr    Comment: Performed at [facility], [place] [address] [place], [place] [address]  C-reactive protein     Status: Abnormal   Collection Time: 01/27/22 12:04 AM  Result Value Ref Range   CRP 4.6 (H) <1.0 mg/dL    Comment: Performed at [place] [address] [place], [place] [address]  Body fluid culture w Gram Stain     Status: None (Preliminary result)   Collection Time: 01/27/22  5:38 AM   Specimen: Joint, Left Wrist; Body Fluid  Result Value Ref Range   Specimen Description      JOINT FLUID LT WRIST Performed at [place] [address], [place], [place] [address]    Special Requests      Normal Performed at [facility], [place] [address] [place], [place] [address]    Gram Stain      RARE GRAM POSITIVE COCCI IN CLUSTERS ABUNDANT WBC PRESENT, PREDOMINANTLY PMN Performed at [facility], [place] [address] [place], [place] [address]    Culture PENDING    Report Status PENDING    No results found.  Pending Labs Unresulted Labs (From admission, onward)     Start     Ordered   01/28/22 0500  CBC  Daily at 5am,   R      01/27/22 1649   01/28/22 6440  Basic metabolic panel  Daily at 5am,   R      01/27/22 1649            Vitals/Pain Today's Vitals   01/27/22 1419 01/27/22 1430 01/27/22 1446 01/27/22 1548  BP: (!) 130/92 (!) 144/90  (!) 141/93  Pulse: 87 87  88  Resp: (!) '22 20  19  '$ Temp:   98.5 F (36.9 C) 99.4 F (37.4  C)  TempSrc:   Oral  Oral  SpO2: 100% 98%  99%  Weight:      Height:      PainSc:        Isolation Precautions No active isolations  Medications Medications  vancomycin (VANCOREADY) IVPB 1750 mg/350 mL (has no administration in time range)  aspirin chewable tablet 81 mg (has no administration in time range)  amLODipine (NORVASC) tablet 10 mg (has no administration in time range)  lisinopril (ZESTRIL) tablet 10 mg (has no administration in time range)  FLUoxetine (PROZAC) capsule 20 mg (has no administration in time range)  insulin glargine-yfgn (SEMGLEE) Pen 28 Units (has no administration in time range)  metFORMIN (GLUCOPHAGE) tablet 1,000 mg (has no administration in time range)  enoxaparin (LOVENOX) injection 30 mg (has no administration in time range)  acetaminophen (TYLENOL) tablet 650 mg (has no administration in time range)    Or  acetaminophen (TYLENOL) suppository 650 mg (has no administration in time range)  HYDROmorphone (DILAUDID) injection 0.5-1 mg (has no administration in time range)  bisacodyl (DULCOLAX) EC tablet 5 mg (has no administration in time range)  senna-docusate (Senokot-S) tablet 1 tablet (has no administration in time range)  insulin aspart (novoLOG) injection 0-9 Units (has no administration in time range)  albuterol (PROVENTIL) (2.5 MG/3ML) 0.083% nebulizer solution 2.5 mg (has no administration in time range)  predniSONE (DELTASONE) tablet 40 mg (has no administration in time range)  lidocaine-EPINEPHrine (XYLOCAINE W/EPI) 2 %-1:200000 (PF) injection 10 mL (10 mLs Infiltration Given 01/27/22 0515)  vancomycin (VANCOCIN) IVPB 1000 mg/200 mL premix (0 mg Intravenous Stopped 01/27/22 0093)    Mobility walks Low fall risk    R Recommendations: See Admitting Provider Note  Report given to:   Additional Notes:

## 2022-01-27 NOTE — ED Triage Notes (Signed)
Pt arrived via [place] from [facility] Emergency Department. Per EMS pt had multiple days of left wrist swelling. Pt sent over for sx consult.

## 2022-01-27 NOTE — ED Notes (Signed)
Pt given ice water to drink

## 2022-01-27 NOTE — H&P (View-Only) (Signed)
Reason for Consult:Left wrist pain Referring Physician: Gareth Morgan Time called: [phone] Time at bedside: 53   Max [name] is an 76 y.o. male.  HPI: Max [name] was in his usual state of health when he woke up Sunday morning with wrist pain. It was bad enough that he went to ED at [facility] but left before being seen 2/2 long wait. He returned Monday, was seen yesterday and had his wrist tapped. Cell count was not done but tap showed GPC in clusters and hand surgery was consulted. Request was made for [facility] transfer but for some reason that did not happen and he was allowed to eat. He was eventually transferred. He denies fevers, chills, sweats, N/V, or prior e/o. He remembers no antecedent event and is RHD and retired.  Past Medical History:  Diagnosis Date   ANXIETY, SITUATIONAL    DEGENERATIVE DISC DISEASE    DERMATITIS    DIABETES MELLITUS, TYPE II, ON INSULIN, CONTROLLED    Eosinophilia    HYPERLIPIDEMIA    HYPERTENSION    LOW BACK PAIN    PRURITUS 2011   RENAL CELL CANCER 01/25/2005   s/p partial nephrectomy   SEPTIC ARTHRITIS 07/2009   L ankle - MSSA   SHOULDER PAIN, RIGHT, CHRONIC     Past Surgical History:  Procedure Laterality Date   BACK SURGERY  04/2005   Dr. Annette Stable   LAPAROSCOPIC PARTIAL NEPHRECTOMY  01/25/05   Dr. Risa Grill   REPAIR ANKLE LIGAMENT  04/2009   disatal fib/infx    Family History  Problem Relation Age of Onset   Asthma Father     Social History:  reports that he quit smoking about 27 years ago. His smoking use included cigarettes. He has never used smokeless tobacco. He reports current alcohol use. He reports that he does not use drugs.  Allergies:  Allergies  Allergen Reactions   Zocor [Simvastatin] Other (See Comments)    Myositis    Medications: I have reviewed the patient's current medications.  Results for orders placed or performed during the hospital encounter of 01/26/22 (from the past 48 hour(s))  CBC with Differential     Status: Abnormal    Collection Time: 01/27/22 12:04 AM  Result Value Ref Range   WBC 10.3 4.0 - 10.5 K/uL   RBC 3.69 (L) 4.22 - 5.81 MIL/uL   Hemoglobin 11.8 (L) 13.0 - 17.0 g/dL   HCT 36.5 (L) 39.0 - 52.0 %   MCV 98.9 80.0 - 100.0 fL   MCH 32.0 26.0 - 34.0 pg   MCHC 32.3 30.0 - 36.0 g/dL   RDW 16.7 (H) 11.5 - 15.5 %   Platelets 331 150 - 400 K/uL   nRBC 0.0 0.0 - 0.2 %   Neutrophils Relative % 84 %   Neutro Abs 8.7 (H) 1.7 - 7.7 K/uL   Lymphocytes Relative 7 %   Lymphs Abs 0.7 0.7 - 4.0 K/uL   Monocytes Relative 8 %   Monocytes Absolute 0.8 0.1 - 1.0 K/uL   Eosinophils Relative 1 %   Eosinophils Absolute 0.1 0.0 - 0.5 K/uL   Basophils Relative 0 %   Basophils Absolute 0.0 0.0 - 0.1 K/uL   Immature Granulocytes 0 %   Abs Immature Granulocytes 0.02 0.00 - 0.07 K/uL    Comment: Performed at [facility], [place] [address] [place], [place] [address]  Basic metabolic panel     Status: Abnormal   Collection Time: 01/27/22 12:04 AM  Result Value Ref Range  Sodium 137 135 - 145 mmol/L   Potassium 4.0 3.5 - 5.1 mmol/L   Chloride 109 98 - 111 mmol/L   CO2 21 (L) 22 - 32 mmol/L   Glucose, Bld 138 (H) 70 - 99 mg/dL    Comment: Glucose reference range applies only to samples taken after fasting for at least 8 hours.   BUN 31 (H) 8 - 23 mg/dL   Creatinine, Ser 1.59 (H) 0.61 - 1.24 mg/dL   Calcium 8.3 (L) 8.9 - 10.3 mg/dL   GFR, Estimated 45 (L) >60 mL/min    Comment: (NOTE) Calculated using the CKD-EPI Creatinine Equation (2021)    Anion gap 7 5 - 15    Comment: Performed at [facility], [place] [address], [place], [place] [address]  Sedimentation rate     Status: Abnormal   Collection Time: 01/27/22 12:04 AM  Result Value Ref Range   Sed Rate 57 (H) 0 - 16 mm/hr    Comment: Performed at [facility], [place] [address], [place], [place] [address]  C-reactive protein     Status: Abnormal   Collection Time: 01/27/22 12:04 AM  Result Value Ref Range    CRP 4.6 (H) <1.0 mg/dL    Comment: Performed at [place] [address], [place], [place] [address]  Body fluid culture w Gram Stain     Status: None (Preliminary result)   Collection Time: 01/27/22  5:38 AM   Specimen: Joint, Left Wrist; Body Fluid  Result Value Ref Range   Specimen Description      JOINT FLUID LT WRIST Performed at [place] [address] [place], [place] [address]    Special Requests      Normal Performed at [facility], [place] [address] [place], [place] [address]    Gram Stain      RARE GRAM POSITIVE COCCI IN CLUSTERS ABUNDANT WBC PRESENT, PREDOMINANTLY PMN Performed at [facility], [place] [address] [place], [place] [address]    Culture PENDING    Report Status PENDING     No results found.  Review of Systems  Constitutional:  Negative for chills, diaphoresis and fever.  HENT:  Negative for ear discharge, ear pain, hearing loss and tinnitus.   Eyes:  Negative for photophobia and pain.  Respiratory:  Negative for cough and shortness of breath.   Cardiovascular:  Negative for chest pain.  Gastrointestinal:  Negative for abdominal pain, nausea and vomiting.  Genitourinary:  Negative for dysuria, flank pain, frequency and urgency.  Musculoskeletal:  Positive for arthralgias (Left wrist). Negative for back pain, myalgias and neck pain.  Neurological:  Negative for dizziness and headaches.  Hematological:  Does not bruise/bleed easily.  Psychiatric/Behavioral:  The patient is not nervous/anxious.    Blood pressure (!) 144/90, pulse 87, temperature 98.5 F (36.9 C), temperature source Oral, resp. rate 20, height 5' 6.5" (1.689 m), weight 72.6 kg, SpO2 98 %. Physical Exam Constitutional:      General: He is not in acute distress.    Appearance: He is well-developed. He is not diaphoretic.  HENT:     Head: Normocephalic and atraumatic.  Eyes:     General: No scleral icterus.       Right eye: No  discharge.        Left eye: No discharge.     Conjunctiva/sclera: Conjunctivae normal.  Cardiovascular:     Rate and Rhythm: Normal rate and regular rhythm.  Pulmonary:     Effort: Pulmonary effort is normal. No respiratory distress.  Musculoskeletal:     Cervical back: Normal range of motion.     Comments: Left shoulder, elbow, wrist, digits- no skin wounds, mild wrist/hand edema, mod TTP wrist , ~5 degrees AROM, 15 degrees PROM wrist, no instability, no blocks to motion  Sens  Ax/R/M/U intact  Mot   Ax/ R/ PIN/ M/ AIN/ U intact  Rad 2+  Skin:    General: Skin is warm and dry.  Neurological:     Mental Status: He is alert.  Psychiatric:        Mood and Affect: Mood normal.        Behavior: Behavior normal.     Assessment/Plan: Left wrist pain -- Seems likely to be septic given DM and presentation. Unfortunately he was allowed to eat 1h prior to my evaluation. Will ask medicine to admit for IV abx. We will make NPO after MN and reassess in am. Splint for comfort.    Lisette Abu, PA-C Orthopedic Surgery [phone] 01/27/2022, 3:43 PM

## 2022-01-28 DIAGNOSIS — N179 Acute kidney failure, unspecified: Secondary | ICD-10-CM

## 2022-01-28 DIAGNOSIS — M009 Pyogenic arthritis, unspecified: Secondary | ICD-10-CM | POA: Diagnosis not present

## 2022-01-28 LAB — BASIC METABOLIC PANEL
Anion gap: 10 (ref 5–15)
BUN: 26 mg/dL — ABNORMAL HIGH (ref 8–23)
CO2: 22 mmol/L (ref 22–32)
Calcium: 8.6 mg/dL — ABNORMAL LOW (ref 8.9–10.3)
Chloride: 104 mmol/L (ref 98–111)
Creatinine, Ser: 1.57 mg/dL — ABNORMAL HIGH (ref 0.61–1.24)
GFR, Estimated: 46 mL/min — ABNORMAL LOW (ref >60)
Glucose, Bld: 297 mg/dL — ABNORMAL HIGH (ref 70–99)
Potassium: 4.5 mmol/L (ref 3.5–5.1)
Sodium: 136 mmol/L (ref 135–145)

## 2022-01-28 LAB — GLUCOSE, CAPILLARY
Glucose-Capillary: 184 mg/dL — ABNORMAL HIGH (ref 70–99)
Glucose-Capillary: 133 mg/dL — ABNORMAL HIGH (ref 70–99)
Glucose-Capillary: 168 mg/dL — ABNORMAL HIGH (ref 70–99)
Glucose-Capillary: 264 mg/dL — ABNORMAL HIGH (ref 70–99)

## 2022-01-28 LAB — CBC
HCT: 37 % — ABNORMAL LOW (ref 39.0–52.0)
Hemoglobin: 12.4 g/dL — ABNORMAL LOW (ref 13.0–17.0)
MCH: 32.4 pg (ref 26.0–34.0)
MCHC: 33.5 g/dL (ref 30.0–36.0)
MCV: 96.6 fL (ref 80.0–100.0)
Platelets: 338 10*3/uL (ref 150–400)
RBC: 3.83 MIL/uL — ABNORMAL LOW (ref 4.22–5.81)
RDW: 15.9 % — ABNORMAL HIGH (ref 11.5–15.5)
WBC: 8 10*3/uL (ref 4.0–10.5)
nRBC: 0 % (ref 0.0–0.2)

## 2022-01-28 LAB — SURGICAL PCR SCREEN
MRSA, PCR: NEGATIVE
Staphylococcus aureus: POSITIVE — AB

## 2022-01-28 MED ORDER — ORAL CARE MOUTH RINSE
15.0000 mL | OROMUCOSAL | Status: DC | PRN
Start: 2022-01-28 — End: 2022-01-30

## 2022-01-28 MED ORDER — SODIUM CHLORIDE 0.9 % IV SOLN
INTRAVENOUS | Status: DC
Start: 2022-01-28 — End: 2022-01-30

## 2022-01-28 MED ORDER — MUPIROCIN 2 % EX OINT
1.0000 | TOPICAL_OINTMENT | Freq: Two times a day (BID) | CUTANEOUS | Status: DC
  Administered 2022-01-28 – 2022-01-30 (×5): 1 via NASAL
  Filled 2022-01-28 (×2): qty 22

## 2022-01-28 NOTE — Progress Notes (Signed)
Inpatient Diabetes Program Recommendations  AACE/ADA: New Consensus Statement on Inpatient Glycemic Control (2015)  Target Ranges:  Prepandial:   less than 140 mg/dL      Peak postprandial:   less than 180 mg/dL (1-2 hours)      Critically ill patients:  140 - 180 mg/dL   Lab Results  Component Value Date   GLUCAP 184 (H) 01/28/2022   HGBA1C 7.6 (H) 08/26/2021    Review of Glycemic Control  Latest Reference Range & Units 01/27/22 20:51 01/28/22 08:04  Glucose-Capillary 70 - 99 mg/dL 391 (H) 184 (H)   Diabetes history: DM 2 Outpatient Diabetes medications:  Semglee 28 units daily Metformin 1000 mg bid Prednisone 40 mg q AM Current orders for Inpatient glycemic control:  Novolog 0-9 units tid with  meals Semglee 28 units q HS Prednisone 40 mg q AM Inpatient Diabetes Program Recommendations:    Please consider adding Novolog meal coverage 6 units tid with meals while on steroids.    Thanks,  Adah Perl, RN, BC-ADM Inpatient Diabetes Coordinator Pager [phone]  (8a-5p)

## 2022-01-28 NOTE — Progress Notes (Signed)
Patient ID: Max [name], male   DOB: 12/10/1945, 76 y.o.   MRN: [id number]   LOS: 1 day   Subjective: Wrist feels about the same (though as he's telling me that he's moving the wrist much more than yesterday.)   Objective: Vital signs in last 24 hours: Temp:  [98.1 F (36.7 C)-99.4 F (37.4 C)] 98.6 F (37 C) (08/31 0803) Pulse Rate:  [74-104] 79 (08/31 0803) Resp:  [13-34] 14 (08/31 0803) BP: (117-172)/(71-94) 117/80 (08/31 0803) SpO2:  [97 %-100 %] 97 % (08/31 0803)     Laboratory  CBC Recent Labs    01/27/22 0004 01/28/22 0342  WBC 10.3 8.0  HGB 11.8* 12.4*  HCT 36.5* 37.0*  PLT 331 338   BMET Recent Labs    01/27/22 0004 01/28/22 0342  NA 137 136  K 4.0 4.5  CL 109 104  CO2 21* 22  GLUCOSE 138* 297*  BUN 31* 26*  CREATININE 1.59* 1.57*  CALCIUM 8.3* 8.6*     Physical Exam General appearance: alert and no distress Left wrist: Edema unchanged. AROM ~30 degrees, PROM ~55 degrees, minimal TTP   Assessment/Plan: Left wrist pain -- Given improvement and lack of fever or WBC will continue non-operative management.    Lisette Abu, PA-C Orthopedic Surgery [phone] 01/28/2022

## 2022-01-28 NOTE — Progress Notes (Addendum)
PROGRESS NOTE    Max [name]  YBO:[id number] DOB: 1946/04/21 DOA: 01/26/2022 PCP: Binnie Rail, MD   Brief Narrative: Max [name] is a 76 y.o. male with a history of hypertension, diabetes mellitus type 2, psoriasis, left ankle septic arthritis. Patient presented secondary to left hand swelling and pain with evidence of septic arthritis on clinical examination. Orthopedic surgery consulted and performed bedside I&D. Empiric Vancomycin initiated. Wound cultures obtained.   Assessment and Plan:  Left wrist septic arthritis Associated WBC of 10.3 on admission. Empiric Vancomycin IV initiated. No blood cultures obtained. Bedside I&D (8/30) performed in the ED by orthopedic surgery. Wound culture (8/30) significant for GPC in clusters on gram stain. -Continue Vancomycin IV -Follow-up orthopedic surgery recommendations: non-operative management -Discontinue prednisone  AKI Baseline creatinine of 1.2.   Primary hypertension Patient is on amlodipine and lisinopril as an outpatient. Lisinopril held secondary to AKI. -Continue amlodipine  Diabetes mellitus type 2 Diabetes is uncontrolled hyperglycemia. Most recent hemoglobin A1C of 7.6%. Patient is on insulin glargine and metformin as an outpatient. -Continue Semglee 28 units qHS -Continue SSI  Psoriasis It appears prednisone was not prescribed for management of psoriasis but rather for treatment of wrist pain by EDP on 8/28 -Discontinue prednisone   Anxiety -Continue Prozac   DVT prophylaxis: Lovenox Code Status:   Code Status: Full Code Family Communication: None at bedside Disposition Plan: Discharge home pending orthopedic surgery recommendations   Consultants:  Orthopedic surgery  Procedures:  I&D (8/30)  Antimicrobials: Vancomycin    Subjective: Patient reports some improvement of wrist pain. No issues overnight. Thirsty.  Objective: BP 119/71 (BP Location: Right Arm)   Pulse 86   Temp 99 F (37.2  C) (Oral)   Resp 18   Ht 5' 6.5" (1.689 m)   Wt 72.6 kg   SpO2 100%   BMI 25.44 kg/m   Examination:  General exam: Appears calm and comfortable Respiratory system: Clear to auscultation. Respiratory effort normal. Cardiovascular system: S1 & S2 heard, RRR. No murmurs, rubs, gallops or clicks. Gastrointestinal system: Abdomen is nondistended, soft and nontender. No organomegaly or masses felt. Normal bowel sounds heard. Central nervous system: Alert and oriented. No focal neurological deficits. Musculoskeletal: Left wrist with mild tenderness, no erythema. Some trace left hand edema. Skin: No cyanosis. No rashes Psychiatry: Judgement and insight appear normal. Mood & affect appropriate.    Data Reviewed: I have personally reviewed following labs and imaging studies  CBC Lab Results  Component Value Date   WBC 8.0 01/28/2022   RBC 3.83 (L) 01/28/2022   HGB 12.4 (L) 01/28/2022   HCT 37.0 (L) 01/28/2022   MCV 96.6 01/28/2022   MCH 32.4 01/28/2022   PLT 338 01/28/2022   MCHC 33.5 01/28/2022   RDW 15.9 (H) 01/28/2022   LYMPHSABS 0.7 01/27/2022   MONOABS 0.8 01/27/2022   EOSABS 0.1 01/27/2022   BASOSABS 0.0 27/78/2423     Last metabolic panel Lab Results  Component Value Date   NA 136 01/28/2022   K 4.5 01/28/2022   CL 104 01/28/2022   CO2 22 01/28/2022   BUN 26 (H) 01/28/2022   CREATININE 1.57 (H) 01/28/2022   GLUCOSE 297 (H) 01/28/2022   GFRNONAA 46 (L) 01/28/2022   GFRAA 82 (L) 03/06/2012   CALCIUM 8.6 (L) 01/28/2022   PROT 8.1 08/26/2021   ALBUMIN 4.4 08/26/2021   BILITOT 0.4 08/26/2021   ALKPHOS 74 08/26/2021   AST 15 08/26/2021   ALT 9 08/26/2021   ANIONGAP 10 01/28/2022  GFR: Estimated Creatinine Clearance: 37.4 mL/min (A) (by C-G formula based on SCr of 1.57 mg/dL (H)).  Recent Results (from the past 240 hour(s))  Body fluid culture w Gram Stain     Status: None (Preliminary result)   Collection Time: 01/27/22  5:38 AM   Specimen: Joint, Left  Wrist; Body Fluid  Result Value Ref Range Status   Specimen Description   Final    JOINT FLUID LT WRIST Performed at [place] [address], [place], [place] [address]    Special Requests   Final    Normal Performed at [facility], [place] [address], [place], [place] [address]    Gram Stain   Final    RARE GRAM POSITIVE COCCI IN CLUSTERS ABUNDANT WBC PRESENT, PREDOMINANTLY PMN Performed at [facility], [place] [address], [place], [place] [address]    Culture PENDING  Incomplete   Report Status PENDING  Incomplete  Surgical PCR screen     Status: Abnormal   Collection Time: 01/28/22  5:31 AM   Specimen: Nasal Mucosa; Nasal Swab  Result Value Ref Range Status   MRSA, PCR NEGATIVE NEGATIVE Final   Staphylococcus aureus POSITIVE (A) NEGATIVE Final    Comment: (NOTE) The Xpert SA Assay (FDA approved for NASAL specimens in patients 72 years of age and older), is one component of a comprehensive surveillance program. It is not intended to diagnose infection nor to guide or monitor treatment. Performed at [facility], [place] [address] [place], [place] [address]       Radiology Studies: No results found.    LOS: 1 day    Cordelia Poche, MD [facility] 01/28/2022, 7:58 AM   If 7PM-7AM, please contact night-coverage [email or web address]

## 2022-01-28 NOTE — Plan of Care (Signed)

## 2022-01-28 NOTE — Anesthesia Preprocedure Evaluation (Addendum)
Anesthesia Evaluation  Patient identified by MRN, date of birth, ID band Patient awake    Reviewed: Allergy & Precautions, H&P , NPO status , Patient's Chart, lab work & pertinent test results  Airway Mallampati: II  TM Distance: >3 FB Neck ROM: Full    Dental no notable dental hx. (+) Edentulous Upper, Dental Advisory Given   Pulmonary neg pulmonary ROS, former smoker,    Pulmonary exam normal breath sounds clear to auscultation       Cardiovascular Exercise Tolerance: Good hypertension, Pt. on medications  Rhythm:Regular Rate:Normal     Neuro/Psych Anxiety negative neurological ROS     GI/Hepatic negative GI ROS, Neg liver ROS,   Endo/Other  diabetes, Type 2, Insulin Dependent, Oral Hypoglycemic Agents  Renal/GU Renal InsufficiencyRenal disease  negative genitourinary   Musculoskeletal  (+) Arthritis ,   Abdominal   Peds  Hematology  (+) Blood dyscrasia, anemia ,   Anesthesia Other Findings   Reproductive/Obstetrics negative OB ROS                            Anesthesia Physical Anesthesia Plan  ASA: 3  Anesthesia Plan: General   Post-op Pain Management: Tylenol PO (pre-op)*   Induction: Intravenous  PONV Risk Score and Plan: 3 and Ondansetron, Dexamethasone and Midazolam  Airway Management Planned: LMA  Additional Equipment:   Intra-op Plan:   Post-operative Plan: Extubation in OR  Informed Consent: I have reviewed the patients History and Physical, chart, labs and discussed the procedure including the risks, benefits and alternatives for the proposed anesthesia with the patient or authorized representative who has indicated his/her understanding and acceptance.     Dental advisory given  Plan Discussed with: CRNA  Anesthesia Plan Comments:        Anesthesia Quick Evaluation

## 2022-01-29 ENCOUNTER — Other Ambulatory Visit (HOSPITAL_COMMUNITY): Payer: Self-pay

## 2022-01-29 ENCOUNTER — Encounter (HOSPITAL_COMMUNITY)
Admission: EM | Disposition: A | Discharge status: Home / Self Care | Payer: Self-pay | Source: Home / Self Care | Attending: Family Medicine

## 2022-01-29 ENCOUNTER — Inpatient Hospital Stay (HOSPITAL_COMMUNITY): Payer: No Typology Code available for payment source | Admitting: Anesthesiology

## 2022-01-29 DIAGNOSIS — N179 Acute kidney failure, unspecified: Secondary | ICD-10-CM | POA: Diagnosis not present

## 2022-01-29 DIAGNOSIS — M00032 Staphylococcal arthritis, left wrist: Secondary | ICD-10-CM | POA: Diagnosis not present

## 2022-01-29 DIAGNOSIS — Z794 Long term (current) use of insulin: Secondary | ICD-10-CM

## 2022-01-29 DIAGNOSIS — Z87891 Personal history of nicotine dependence: Secondary | ICD-10-CM

## 2022-01-29 DIAGNOSIS — E119 Type 2 diabetes mellitus without complications: Secondary | ICD-10-CM

## 2022-01-29 DIAGNOSIS — I10 Essential (primary) hypertension: Secondary | ICD-10-CM

## 2022-01-29 DIAGNOSIS — M009 Pyogenic arthritis, unspecified: Secondary | ICD-10-CM | POA: Diagnosis not present

## 2022-01-29 DIAGNOSIS — Z7984 Long term (current) use of oral hypoglycemic drugs: Secondary | ICD-10-CM

## 2022-01-29 DIAGNOSIS — F419 Anxiety disorder, unspecified: Secondary | ICD-10-CM

## 2022-01-29 DIAGNOSIS — E11649 Type 2 diabetes mellitus with hypoglycemia without coma: Secondary | ICD-10-CM

## 2022-01-29 HISTORY — PX: I & D EXTREMITY: SHX5045

## 2022-01-29 LAB — CBC
HCT: 33.5 % — ABNORMAL LOW (ref 39.0–52.0)
Hemoglobin: 11.1 g/dL — ABNORMAL LOW (ref 13.0–17.0)
MCH: 32 pg (ref 26.0–34.0)
MCHC: 33.1 g/dL (ref 30.0–36.0)
MCV: 96.5 fL (ref 80.0–100.0)
Platelets: 317 10*3/uL (ref 150–400)
RBC: 3.47 MIL/uL — ABNORMAL LOW (ref 4.22–5.81)
RDW: 15.9 % — ABNORMAL HIGH (ref 11.5–15.5)
WBC: 6.3 10*3/uL (ref 4.0–10.5)
nRBC: 0 % (ref 0.0–0.2)

## 2022-01-29 LAB — HIV ANTIBODY (ROUTINE TESTING W REFLEX): HIV Screen 4th Generation wRfx: NONREACTIVE

## 2022-01-29 LAB — BODY FLUID CULTURE W GRAM STAIN: Special Requests: NORMAL

## 2022-01-29 LAB — GLUCOSE, CAPILLARY
Glucose-Capillary: 34 mg/dL — CL (ref 70–99)
Glucose-Capillary: 107 mg/dL — ABNORMAL HIGH (ref 70–99)
Glucose-Capillary: 91 mg/dL (ref 70–99)
Glucose-Capillary: 99 mg/dL (ref 70–99)
Glucose-Capillary: 94 mg/dL (ref 70–99)
Glucose-Capillary: 65 mg/dL — ABNORMAL LOW (ref 70–99)

## 2022-01-29 LAB — BASIC METABOLIC PANEL
Anion gap: 6 (ref 5–15)
BUN: 26 mg/dL — ABNORMAL HIGH (ref 8–23)
CO2: 20 mmol/L — ABNORMAL LOW (ref 22–32)
Calcium: 7.9 mg/dL — ABNORMAL LOW (ref 8.9–10.3)
Chloride: 111 mmol/L (ref 98–111)
Creatinine, Ser: 1.18 mg/dL (ref 0.61–1.24)
GFR, Estimated: >60 mL/min (ref >60)
Glucose, Bld: 62 mg/dL — ABNORMAL LOW (ref 70–99)
Potassium: 3.7 mmol/L (ref 3.5–5.1)
Sodium: 137 mmol/L (ref 135–145)

## 2022-01-29 LAB — HEPATITIS B SURFACE ANTIGEN: Hepatitis B Surface Ag: NONREACTIVE

## 2022-01-29 SURGERY — IRRIGATION AND DEBRIDEMENT EXTREMITY
Anesthesia: General | Site: Arm Lower | Laterality: Left

## 2022-01-29 MED ORDER — ONDANSETRON HCL 4 MG/2ML IJ SOLN
INTRAMUSCULAR | Status: DC | PRN
  Administered 2022-01-29: 4 mg via INTRAVENOUS

## 2022-01-29 MED ORDER — VANCOMYCIN HCL 1000 MG IV SOLR
INTRAVENOUS | Status: AC
  Filled 2022-01-29: qty 20

## 2022-01-29 MED ORDER — DEXTROSE 50 % IV SOLN
INTRAVENOUS | Status: AC
  Administered 2022-01-29: 50 mL
  Filled 2022-01-29: qty 50

## 2022-01-29 MED ORDER — PROPOFOL 10 MG/ML IV BOLUS
INTRAVENOUS | Status: AC
  Filled 2022-01-29: qty 20

## 2022-01-29 MED ORDER — PROPOFOL 10 MG/ML IV BOLUS
INTRAVENOUS | Status: DC | PRN
  Administered 2022-01-29: 30 mg via INTRAVENOUS
  Administered 2022-01-29: 120 mg via INTRAVENOUS
  Administered 2022-01-29: 50 mg via INTRAVENOUS

## 2022-01-29 MED ORDER — ACETAMINOPHEN 500 MG PO TABS
1000.0000 mg | ORAL_TABLET | Freq: Once | ORAL | Status: AC
  Administered 2022-01-29: 1000 mg via ORAL
  Filled 2022-01-29 (×2): qty 2

## 2022-01-29 MED ORDER — SODIUM CHLORIDE 0.9 % IR SOLN
Status: DC | PRN
  Administered 2022-01-29: 1

## 2022-01-29 MED ORDER — CEFAZOLIN SODIUM-DEXTROSE 2-4 GM/100ML-% IV SOLN
2.0000 g | Freq: Three times a day (TID) | INTRAVENOUS | Status: DC
  Administered 2022-01-29 – 2022-01-30 (×3): 2 g via INTRAVENOUS
  Filled 2022-01-29 (×3): qty 100

## 2022-01-29 MED ORDER — HYDROMORPHONE HCL 1 MG/ML IJ SOLN: 0.2500 mg | INTRAMUSCULAR | Status: DC | PRN

## 2022-01-29 MED ORDER — FENTANYL CITRATE (PF) 250 MCG/5ML IJ SOLN
INTRAMUSCULAR | Status: AC
  Filled 2022-01-29: qty 5

## 2022-01-29 MED ORDER — CEFADROXIL 500 MG PO CAPS
1000.0000 mg | ORAL_CAPSULE | Freq: Two times a day (BID) | ORAL | 0 refills | Status: DC
  Filled 2022-01-29: qty 168, 42d supply, fill #0

## 2022-01-29 MED ORDER — MUPIROCIN 2 % EX OINT
1.0000 | TOPICAL_OINTMENT | Freq: Two times a day (BID) | CUTANEOUS | Status: DC
  Administered 2022-01-30: 1 via NASAL
  Filled 2022-01-29: qty 22

## 2022-01-29 MED ORDER — STERILE WATER FOR IRRIGATION IR SOLN
Status: DC | PRN
  Administered 2022-01-29: 1000 mL

## 2022-01-29 MED ORDER — CHLORHEXIDINE GLUCONATE CLOTH 2 % EX PADS
6.0000 | MEDICATED_PAD | Freq: Every day | CUTANEOUS | Status: DC
  Administered 2022-01-29 – 2022-01-30 (×2): 6 via TOPICAL

## 2022-01-29 MED ORDER — PNEUMOCOCCAL 20-VAL CONJ VACC 0.5 ML IM SUSY
0.5000 mL | PREFILLED_SYRINGE | INTRAMUSCULAR | Status: DC
  Filled 2022-01-29: qty 0.5

## 2022-01-29 MED ORDER — BUPIVACAINE HCL (PF) 0.25 % IJ SOLN
INTRAMUSCULAR | Status: AC
  Filled 2022-01-29: qty 30

## 2022-01-29 MED ORDER — FENTANYL CITRATE (PF) 100 MCG/2ML IJ SOLN
INTRAMUSCULAR | Status: DC | PRN
Start: 2022-01-29 — End: 2022-01-29
  Administered 2022-01-29 (×2): 50 ug via INTRAVENOUS

## 2022-01-29 MED ORDER — LIDOCAINE HCL (CARDIAC) PF 100 MG/5ML IV SOSY
PREFILLED_SYRINGE | INTRAVENOUS | Status: DC | PRN
  Administered 2022-01-29: 60 mg via INTRAVENOUS

## 2022-01-29 MED ORDER — VANCOMYCIN HCL 1000 MG IV SOLR
INTRAVENOUS | Status: DC | PRN
  Administered 2022-01-29: 1000 mg via INTRAVENOUS

## 2022-01-29 MED ORDER — SODIUM CHLORIDE 0.9 % IV SOLN: INTRAVENOUS | Status: DC | PRN

## 2022-01-29 MED ORDER — INSULIN GLARGINE-YFGN 100 UNIT/ML ~~LOC~~ SOLN
20.0000 [IU] | Freq: Every day | SUBCUTANEOUS | Status: DC
Start: 2022-01-29 — End: 2022-01-30
  Administered 2022-01-29: 20 [IU] via SUBCUTANEOUS
  Filled 2022-01-29 (×2): qty 0.2

## 2022-01-29 SURGICAL SUPPLY — 62 items
BAG COUNTER SPONGE SURGICOUNT (BAG) ×2 IMPLANT
BAG SPNG CNTER NS LX DISP (BAG) ×1
BNDG CMPR 9X4 STRL LF SNTH (GAUZE/BANDAGES/DRESSINGS) ×1
BNDG CMPR STD VLCR NS LF 5.8X4 (GAUZE/BANDAGES/DRESSINGS) ×1
BNDG COHESIVE 1X5 TAN STRL LF (GAUZE/BANDAGES/DRESSINGS) IMPLANT
BNDG CONFORM 2 STRL LF (GAUZE/BANDAGES/DRESSINGS) IMPLANT
BNDG ELASTIC 3X5.8 VLCR STR LF (GAUZE/BANDAGES/DRESSINGS) ×2 IMPLANT
BNDG ELASTIC 4X5.8 VLCR NS LF (GAUZE/BANDAGES/DRESSINGS) IMPLANT
BNDG ELASTIC 4X5.8 VLCR STR LF (GAUZE/BANDAGES/DRESSINGS) ×2 IMPLANT
BNDG ESMARK 4X9 LF (GAUZE/BANDAGES/DRESSINGS) ×2 IMPLANT
BNDG GAUZE DERMACEA FLUFF 4 (GAUZE/BANDAGES/DRESSINGS) ×2 IMPLANT
BNDG GZE DERMACEA 4 6PLY (GAUZE/BANDAGES/DRESSINGS) ×1
CORD BIPOLAR FORCEPS 12FT (ELECTRODE) ×2 IMPLANT
COVER SURGICAL LIGHT HANDLE (MISCELLANEOUS) ×2 IMPLANT
CUFF TOURN SGL QUICK 18X4 (TOURNIQUET CUFF) ×2 IMPLANT
CUFF TOURN SGL QUICK 24 (TOURNIQUET CUFF)
CUFF TRNQT CYL 24X4X16.5-23 (TOURNIQUET CUFF) IMPLANT
DRAPE SURG 17X23 STRL (DRAPES) ×2 IMPLANT
DRSG ADAPTIC 3X8 NADH LF (GAUZE/BANDAGES/DRESSINGS) ×2 IMPLANT
DRSG EMULSION OIL 3X3 NADH (GAUZE/BANDAGES/DRESSINGS) IMPLANT
ELECT REM PT RETURN 9FT ADLT (ELECTROSURGICAL)
ELECTRODE REM PT RTRN 9FT ADLT (ELECTROSURGICAL) IMPLANT
GAUZE SPONGE 4X4 12PLY STRL (GAUZE/BANDAGES/DRESSINGS) ×2 IMPLANT
GAUZE XEROFORM 1X8 LF (GAUZE/BANDAGES/DRESSINGS) ×2 IMPLANT
GAUZE XEROFORM 5X9 LF (GAUZE/BANDAGES/DRESSINGS) IMPLANT
GLOVE BIO SURGEON STRL SZ7.5 (GLOVE) ×2 IMPLANT
GLOVE BIOGEL PI IND STRL 8.5 (GLOVE) ×2 IMPLANT
GLOVE BIOGEL PI INDICATOR 8.5 (GLOVE) ×1
GLOVE SURG ORTHO 8.0 STRL STRW (GLOVE) ×2 IMPLANT
GLOVE SURG UNDER POLY LF SZ7.5 (GLOVE) ×4 IMPLANT
GOWN STRL REUS W/ TWL LRG LVL3 (GOWN DISPOSABLE) ×6 IMPLANT
GOWN STRL REUS W/ TWL XL LVL3 (GOWN DISPOSABLE) ×2 IMPLANT
GOWN STRL REUS W/TWL LRG LVL3 (GOWN DISPOSABLE) ×3
GOWN STRL REUS W/TWL XL LVL3 (GOWN DISPOSABLE) ×1
HANDPIECE INTERPULSE COAX TIP (DISPOSABLE)
KIT BASIN OR (CUSTOM PROCEDURE TRAY) ×2 IMPLANT
KIT TURNOVER KIT B (KITS) ×2 IMPLANT
MANIFOLD NEPTUNE II (INSTRUMENTS) ×2 IMPLANT
NDL HYPO 25GX1X1/2 BEV (NEEDLE) IMPLANT
NEEDLE HYPO 25GX1X1/2 BEV (NEEDLE) ×1 IMPLANT
NS IRRIG 1000ML POUR BTL (IV SOLUTION) ×2 IMPLANT
PACK ORTHO EXTREMITY (CUSTOM PROCEDURE TRAY) ×2 IMPLANT
PAD ARMBOARD 7.5X6 YLW CONV (MISCELLANEOUS) ×4 IMPLANT
PAD CAST 4YDX4 CTTN HI CHSV (CAST SUPPLIES) ×2 IMPLANT
PADDING CAST COTTON 4X4 STRL (CAST SUPPLIES) ×1
SET CYSTO W/LG BORE CLAMP LF (SET/KITS/TRAYS/PACK) IMPLANT
SET HNDPC FAN SPRY TIP SCT (DISPOSABLE) IMPLANT
SOAP 2 % CHG 4 OZ (WOUND CARE) ×2 IMPLANT
SPONGE T-LAP 18X18 ~~LOC~~+RFID (SPONGE) ×2 IMPLANT
SPONGE T-LAP 4X18 ~~LOC~~+RFID (SPONGE) ×2 IMPLANT
SUT ETHILON 4 0 PS 2 18 (SUTURE) IMPLANT
SUT ETHILON 5 0 P 3 18 (SUTURE)
SUT NYLON ETHILON 5-0 P-3 1X18 (SUTURE) IMPLANT
SWAB COLLECTION DEVICE MRSA (MISCELLANEOUS) ×2 IMPLANT
SWAB CULTURE ESWAB REG 1ML (MISCELLANEOUS) IMPLANT
SYR CONTROL 10ML LL (SYRINGE) IMPLANT
TOWEL GREEN STERILE (TOWEL DISPOSABLE) ×2 IMPLANT
TOWEL GREEN STERILE FF (TOWEL DISPOSABLE) ×2 IMPLANT
TUBE CONNECTING 12X1/4 (SUCTIONS) ×2 IMPLANT
UNDERPAD 30X36 HEAVY ABSORB (UNDERPADS AND DIAPERS) ×2 IMPLANT
WATER STERILE IRR 1000ML POUR (IV SOLUTION) ×2 IMPLANT
YANKAUER SUCT BULB TIP NO VENT (SUCTIONS) ×2 IMPLANT

## 2022-01-29 NOTE — Plan of Care (Signed)

## 2022-01-29 NOTE — Inpatient Diabetes Management (Signed)
Inpatient Diabetes Program Recommendations  AACE/ADA: New Consensus Statement on Inpatient Glycemic Control   Target Ranges:  Prepandial:   less than 140 mg/dL      Peak postprandial:   less than 180 mg/dL (1-2 hours)      Critically ill patients:  140 - 180 mg/dL    Latest Reference Range & Units 01/29/22 07:02 01/29/22 07:48 01/29/22 08:49  Glucose-Capillary 70 - 99 mg/dL 34 (LL) 107 (H) 91    Latest Reference Range & Units 01/28/22 08:04 01/28/22 11:48 01/28/22 16:08 01/28/22 21:43  Glucose-Capillary 70 - 99 mg/dL 184 (H) 133 (H) 168 (H) 264 (H)   Review of Glycemic Control  Diabetes history: DM2 Outpatient Diabetes medications: Semglee 28 units daily, Metformin 1000 mg BID; Prednisone 40 mg QAM Current orders for Inpatient glycemic control: Semglee 28 units QHS, Novolog 0-9 units TID with meals  Inpatient Diabetes Program Recommendations:    Insulin: Noted last Prednisone given on 01/28/22 and no other steroids ordered currently. Fasting glucose 34 mg/dl today. Please consider decreasing Semglee to 20 units QHS.  Thanks, Barnie Alderman, RN, MSN, [facility] Diabetes Coordinator Inpatient Diabetes Program [phone] (Team Pager from 8am to [place])

## 2022-01-29 NOTE — Interval H&P Note (Signed)
History and Physical Interval Note:  01/29/2022 7:30 AM  Max [name]  has presented today for surgery, with the diagnosis of Left wrist infection.  The various methods of treatment have been discussed with the patient and family. After consideration of risks, benefits and other options for treatment, the patient has consented to  Procedure(s) with comments: [place] (Left) - 34mn as a surgical intervention.  The patient's history has been reviewed, patient examined, no change in status, stable for surgery.  I have reviewed the patient's chart and labs.  Questions were answered to the patient's satisfaction.     JOrene Desanctis

## 2022-01-29 NOTE — Anesthesia Procedure Notes (Signed)
Procedure Name: LMA Insertion Date/Time: 01/29/2022 8:04 AM  Performed by: Maude Leriche, CRNAPre-anesthesia Checklist: Patient identified, Emergency Drugs available, Suction available, Patient being monitored and Timeout performed Patient Re-evaluated:Patient Re-evaluated prior to induction Oxygen Delivery Method: Circle system utilized Preoxygenation: Pre-oxygenation with 100% oxygen Induction Type: IV induction Ventilation: Mask ventilation without difficulty LMA: LMA inserted LMA Size: 4.0 Number of attempts: 2 Tube secured with: Tape Dental Injury: Teeth and Oropharynx as per pre-operative assessment  Comments: 1st attempt by CRNA, malpositioned, 2nd attempt by Dr. Fransisco Beau

## 2022-01-29 NOTE — Progress Notes (Signed)
PROGRESS NOTE    Max [name]  OIZ:[id number] DOB: 02/14/1946 DOA: 01/26/2022 PCP: Binnie Rail, MD   Brief Narrative: Aadil Sur is a 76 y.o. male with a history of hypertension, diabetes mellitus type 2, psoriasis, left ankle septic arthritis. Patient presented secondary to left hand swelling and pain with evidence of septic arthritis on clinical examination. Orthopedic surgery consulted and performed bedside I&D. Empiric Vancomycin initiated. Wound cultures obtained.   Assessment and Plan:  Left wrist septic arthritis Associated WBC of 10.3 on admission. Empiric Vancomycin IV initiated. No blood cultures obtained. Bedside joint aspiration (8/30) performed in the ED by orthopedic surgery. Wound culture (8/30) significant for MSSA. Orthopedic surgery performed I&D in the OR on 9/1. -Infectious disease consult -Continue Vancomycin IV; anticipate de-esclation by infectious disease -Orthopedic surgery recommendations: bandage change as needed, but original bandage can be left in place for 10-14 days; follow-up in 10 days  AKI Baseline creatinine of 1.2.   Primary hypertension Patient is on amlodipine and lisinopril as an outpatient. Lisinopril held secondary to AKI. -Continue amlodipine  Diabetes mellitus type 2 Diabetes is uncontrolled hyperglycemia and hypoglycemia. Most recent hemoglobin A1C of 7.6%. Patient is on insulin glargine and metformin as an outpatient. Patient reports issues with hypoglycemia at times. Patient with hypoglycemia this morning; blood glucose of 34 that responded with D50.  -Decrease to Semglee 20 units qHS -Continue SSI  Psoriasis It appears prednisone was not prescribed for management of psoriasis but rather for treatment of wrist pain by EDP on 8/28. Prednisone discontinued.  Anxiety -Continue Prozac   DVT prophylaxis: Lovenox Code Status:   Code Status: Full Code Family Communication: None at bedside Disposition Plan: Discharge home  pending orthopedic surgery recommendations   Consultants:  Orthopedic surgery  Procedures:  I&D (8/30)  Antimicrobials: Vancomycin    Subjective: Patient reports some improvement of wrist pain. No issues overnight. Thirsty.  Objective: BP (!) 152/80 (BP Location: Right Arm)   Pulse 64   Temp 97.6 F (36.4 C) (Oral)   Resp 18   Ht 5' 6.5" (1.689 m)   Wt 72.6 kg   SpO2 98%   BMI 25.44 kg/m   Examination:  General exam: Appears calm and comfortable Respiratory system: Clear to auscultation. Respiratory effort normal. Cardiovascular system: S1 & S2 heard, RRR. No murmurs, rubs, gallops or clicks. Gastrointestinal system: Abdomen is nondistended, soft and nontender. No organomegaly or masses felt. Normal bowel sounds heard. Central nervous system: Alert and oriented. No focal neurological deficits. Musculoskeletal: Left wrist with mild tenderness, no erythema. Some trace left hand edema. Skin: No cyanosis. No rashes Psychiatry: Judgement and insight appear normal. Mood & affect appropriate.    Data Reviewed: I have personally reviewed following labs and imaging studies  CBC Lab Results  Component Value Date   WBC 6.3 01/29/2022   RBC 3.47 (L) 01/29/2022   HGB 11.1 (L) 01/29/2022   HCT 33.5 (L) 01/29/2022   MCV 96.5 01/29/2022   MCH 32.0 01/29/2022   PLT 317 01/29/2022   MCHC 33.1 01/29/2022   RDW 15.9 (H) 01/29/2022   LYMPHSABS 0.7 01/27/2022   MONOABS 0.8 01/27/2022   EOSABS 0.1 01/27/2022   BASOSABS 0.0 33/82/5053     Last metabolic panel Lab Results  Component Value Date   NA 137 01/29/2022   K 3.7 01/29/2022   CL 111 01/29/2022   CO2 20 (L) 01/29/2022   BUN 26 (H) 01/29/2022   CREATININE 1.18 01/29/2022   GLUCOSE 62 (L) 01/29/2022  GFRNONAA >60 01/29/2022   GFRAA 82 (L) 03/06/2012   CALCIUM 7.9 (L) 01/29/2022   PROT 8.1 08/26/2021   ALBUMIN 4.4 08/26/2021   BILITOT 0.4 08/26/2021   ALKPHOS 74 08/26/2021   AST 15 08/26/2021   ALT 9  08/26/2021   ANIONGAP 6 01/29/2022    GFR: Estimated Creatinine Clearance: 49.7 mL/min (by C-G formula based on SCr of 1.18 mg/dL).  Recent Results (from the past 240 hour(s))  Body fluid culture w Gram Stain     Status: None   Collection Time: 01/27/22  5:38 AM   Specimen: Joint, Left Wrist; Body Fluid  Result Value Ref Range Status   Specimen Description   Final    JOINT FLUID LT WRIST Performed at [facility] [address] [place], [place] [address]    Special Requests   Final    Normal Performed at [facility], [place] [address] [place], [place] [address]    Gram Stain   Final    RARE GRAM POSITIVE COCCI IN CLUSTERS ABUNDANT WBC PRESENT, PREDOMINANTLY PMN Performed at [facility], [place] [address], [place], [place] [address]    Culture MODERATE STAPHYLOCOCCUS AUREUS  Final   Report Status 01/29/2022 FINAL  Final   Organism ID, Bacteria STAPHYLOCOCCUS AUREUS  Final      Susceptibility   Staphylococcus aureus - MIC*    CIPROFLOXACIN <=0.5 SENSITIVE Sensitive     ERYTHROMYCIN <=0.25 SENSITIVE Sensitive     GENTAMICIN <=0.5 SENSITIVE Sensitive     OXACILLIN 0.5 SENSITIVE Sensitive     TETRACYCLINE <=1 SENSITIVE Sensitive     VANCOMYCIN <=0.5 SENSITIVE Sensitive     TRIMETH/SULFA <=10 SENSITIVE Sensitive     CLINDAMYCIN <=0.25 SENSITIVE Sensitive     RIFAMPIN <=0.5 SENSITIVE Sensitive     Inducible Clindamycin NEGATIVE Sensitive     * MODERATE STAPHYLOCOCCUS AUREUS  Surgical PCR screen     Status: Abnormal   Collection Time: 01/28/22  5:31 AM   Specimen: Nasal Mucosa; Nasal Swab  Result Value Ref Range Status   MRSA, PCR NEGATIVE NEGATIVE Final   Staphylococcus aureus POSITIVE (A) NEGATIVE Final    Comment: (NOTE) The Xpert SA Assay (FDA approved for NASAL specimens in patients 61 years of age and older), is one component of a comprehensive surveillance program. It is not intended to diagnose infection nor to guide or  monitor treatment. Performed at [facility], [place] [address] [place], [place] [address]   Aerobic/Anaerobic Culture w Gram Stain (surgical/deep wound)     Status: None (Preliminary result)   Collection Time: 01/29/22  8:18 AM   Specimen: Wound  Result Value Ref Range Status   Specimen Description WOUND  Final   Special Requests NONE  Final   Gram Stain   Final    ABUNDANT WBC PRESENT,BOTH PMN AND MONONUCLEAR NO ORGANISMS SEEN Performed at [facility], [address] [place], [place] [address]    Culture PENDING  Incomplete   Report Status PENDING  Incomplete      Radiology Studies: No results found.    LOS: 2 days    Cordelia Poche, MD [facility] 01/29/2022, 2:24 PM   If 7PM-7AM, please contact night-coverage [email or web address]

## 2022-01-29 NOTE — Transfer of Care (Signed)
Immediate Anesthesia Transfer of Care Note  Patient: Science writer  Procedure(s) Performed: IRRIGATION AND DEBRIDEMENT LEFT WRIST JOINT (Left: Arm Lower)  Patient Location: PACU  Anesthesia Type:General  Level of Consciousness: awake, alert , and oriented  Airway & Oxygen Therapy: Patient Spontanous Breathing  Post-op Assessment: Report given to RN, Post -op Vital signs reviewed and stable, Patient moving all extremities X 4, and Patient able to stick tongue midline  Post vital signs: Reviewed  Last Vitals:  Vitals Value Taken Time  BP 139/71   Temp 97.4   Pulse 72 01/29/22 0846  Resp 14 01/29/22 0846  SpO2 93 % 01/29/22 0846  Vitals shown include unvalidated device data.  Last Pain:  Vitals:   01/29/22 0400  TempSrc:   PainSc: 0-No pain         Complications: No notable events documented.

## 2022-01-29 NOTE — Consult Note (Signed)
Date of Admission:  01/26/2022          Reason for Consult: Left wrist septic arthritis   Referring Provider: Cordelia Poche, MD   Assessment:  Left septic wrist due to MSSA status post left wrist arthrotomy and I&D of midcarpal and radiocarpal joints Diabetes mellitus Hypertension Psoriasis  Plan:  Narrow to cefazolin Patient wishes to be DC on po rather than IV abx (he was not comfortable with former--so I would like for him to have 42 day supply of cefadroxil to bedside prior to DC I will screen for HIV and hepatitis B (not ever done that I can see)   Max [name] has an appointment on 02/17/2022 at 46 AM with Dr. Tommy Medal  The [facility] for Infectious Disease is located in the [facility] at  [address] in [place].  [address], which is located to the left of the elevators.  Phone: [phone]  Fax: [phone]  [email or web address]  He should arrive 30 minutes prior to his appointment.   My partner Dr. Gale Journey is available for questions this weekend and will follow-up culture data.  Principal Problem:   Septic arthritis ([place]) Active Problems:   Diabetes ([place])   Anxiety   Essential hypertension   AKI (acute kidney injury) ([place])   Scheduled Meds:  amLODipine  10 mg Oral Daily   aspirin  81 mg Oral Daily   Chlorhexidine Gluconate Cloth  6 each Topical Daily   enoxaparin (LOVENOX) injection  40 mg Subcutaneous Q24H   FLUoxetine  20 mg Oral Q0600   insulin aspart  0-9 Units Subcutaneous TID WC   insulin glargine-yfgn  20 Units Subcutaneous QHS   mupirocin ointment  1 Application Nasal BID   mupirocin ointment  1 Application Nasal BID   [START ON 01/30/2022] pneumococcal 20-valent conjugate vaccine  0.5 mL Intramuscular Tomorrow-1000   Continuous Infusions:  sodium chloride 100 mL/hr at 01/29/22 1003   PRN Meds:.acetaminophen **OR** acetaminophen, albuterol, bisacodyl, hydrALAZINE, HYDROmorphone  (DILAUDID) injection, mouth rinse, senna-docusate  HPI: Max [name] is a 76 y.o. male hypertension diabetes mellitus psoriasis remote history of left septic presented to the ER on August 29 after a 4-day history of his left wrist swelling and severe pain.  [name] was in ER where x-rays were unremarkable.  He had a bedside aspiration of the joint with culture taken which yielded methicillin sensitive Staphylococcus aureus.  He was started on vancomycin.  He is taken to the operating room today by Dr. Greta Doom who performed left wrist arthrotomy with I&D of midcarpal and radiocarpal joints.  Cultures were taken bacteria AFB and fungi.  Discussed with him and the conventional treatment of septic arthritis with 6 weeks of parenteral antibiotics, in his case with cefazolin but is not comfortable with the idea of going home with IV antibiotic wanted to go home with pills instead.   I am narrowing him to cefazolin procure 14 days of cefadroxil with two 5 mg tablets to be taken twice daily.  I have scheduled him for follow-up with me in clinic.  I spent 84  minutes with the patient including than 50% of the time in face to face counseling of the patient re his MSSA septic arthritis personally plain films  reviewing along with review of medical records in preparation for the visit and during the visit and in coordination of his care.   Review of Systems: Review of Systems  Constitutional:  Negative for chills, diaphoresis,  fever, malaise/fatigue and weight loss.  HENT:  Negative for congestion, hearing loss, sore throat and tinnitus.   Eyes:  Negative for blurred vision and double vision.  Respiratory:  Negative for cough, sputum production, shortness of breath and wheezing.   Cardiovascular:  Negative for chest pain, palpitations and leg swelling.  Gastrointestinal:  Negative for abdominal pain, blood in stool, constipation, diarrhea, heartburn, melena, nausea and vomiting.  Genitourinary:   Negative for dysuria, flank pain and hematuria.  Musculoskeletal:  Positive for joint pain and myalgias. Negative for back pain and falls.  Skin:  Negative for itching and rash.  Neurological:  Negative for dizziness, sensory change, focal weakness, loss of consciousness, weakness and headaches.  Endo/Heme/Allergies:  Does not bruise/bleed easily.  Psychiatric/Behavioral:  Negative for depression, memory loss and suicidal ideas. The patient is not nervous/anxious.     Past Medical History:  Diagnosis Date   ANXIETY, SITUATIONAL    DEGENERATIVE DISC DISEASE    DERMATITIS    DIABETES MELLITUS, TYPE II, ON INSULIN, CONTROLLED    Eosinophilia    HYPERLIPIDEMIA    HYPERTENSION    LOW BACK PAIN    PRURITUS 2011   RENAL CELL CANCER 01/25/2005   s/p partial nephrectomy   SEPTIC ARTHRITIS 07/2009   L ankle - MSSA   SHOULDER PAIN, RIGHT, CHRONIC     Social History   Tobacco Use   Smoking status: Former    Types: Cigarettes    Quit date: 05/31/1994    Years since quitting: 27.6   Smokeless tobacco: Never   Tobacco comments:    Married, lives with wife. Former Teacher, English as a foreign language  Substance Use Topics   Alcohol use: Yes    Comment: occasionally   Drug use: No    Family History  Problem Relation Age of Onset   Asthma Father    Allergies  Allergen Reactions   Zocor [Simvastatin] Other (See Comments)    Myositis    OBJECTIVE: Blood pressure (!) 152/80, pulse 64, temperature 97.6 F (36.4 C), temperature source Oral, resp. rate 18, height 5' 6.5" (1.689 m), weight 72.6 kg, SpO2 98 %.  Physical Exam Constitutional:      Appearance: He is well-developed.  HENT:     Head: Normocephalic and atraumatic.  Eyes:     Conjunctiva/sclera: Conjunctivae normal.  Cardiovascular:     Rate and Rhythm: Normal rate and regular rhythm.  Pulmonary:     Effort: Pulmonary effort is normal. No respiratory distress.     Breath sounds: No wheezing.  Abdominal:     General: There is no  distension.     Palpations: Abdomen is soft.  Musculoskeletal:     Left wrist: Swelling present.     Cervical back: Normal range of motion and neck supple.     Comments: bandage  Skin:    General: Skin is warm and dry.     Coloration: Skin is not pale.     Findings: No erythema or rash.  Neurological:     General: No focal deficit present.     Mental Status: He is alert and oriented to person, place, and time.  Psychiatric:        Mood and Affect: Mood normal.        Behavior: Behavior normal.        Thought Content: Thought content normal.        Judgment: Judgment normal.     Lab Results Lab Results  Component Value Date   WBC 6.3 01/29/2022  HGB 11.1 (L) 01/29/2022   HCT 33.5 (L) 01/29/2022   MCV 96.5 01/29/2022   PLT 317 01/29/2022    Lab Results  Component Value Date   CREATININE 1.18 01/29/2022   BUN 26 (H) 01/29/2022   NA 137 01/29/2022   K 3.7 01/29/2022   CL 111 01/29/2022   CO2 20 (L) 01/29/2022    Lab Results  Component Value Date   ALT 9 08/26/2021   AST 15 08/26/2021   ALKPHOS 74 08/26/2021   BILITOT 0.4 08/26/2021     Microbiology: Recent Results (from the past 240 hour(s))  Body fluid culture w Gram Stain     Status: None   Collection Time: 01/27/22  5:38 AM   Specimen: Joint, Left Wrist; Body Fluid  Result Value Ref Range Status   Specimen Description   Final    JOINT FLUID LT WRIST Performed at [facility], [place] [address] [place], [place] [address]    Special Requests   Final    Normal Performed at [facility], [place] [address], [place], [place] [address]    Gram Stain   Final    RARE GRAM POSITIVE COCCI IN CLUSTERS ABUNDANT WBC PRESENT, PREDOMINANTLY PMN Performed at [facility], [place] [address] [place], [place] [address]    Culture MODERATE STAPHYLOCOCCUS AUREUS  Final   Report Status 01/29/2022 FINAL  Final   Organism ID, Bacteria STAPHYLOCOCCUS AUREUS  Final      Susceptibility    Staphylococcus aureus - MIC*    CIPROFLOXACIN <=0.5 SENSITIVE Sensitive     ERYTHROMYCIN <=0.25 SENSITIVE Sensitive     GENTAMICIN <=0.5 SENSITIVE Sensitive     OXACILLIN 0.5 SENSITIVE Sensitive     TETRACYCLINE <=1 SENSITIVE Sensitive     VANCOMYCIN <=0.5 SENSITIVE Sensitive     TRIMETH/SULFA <=10 SENSITIVE Sensitive     CLINDAMYCIN <=0.25 SENSITIVE Sensitive     RIFAMPIN <=0.5 SENSITIVE Sensitive     Inducible Clindamycin NEGATIVE Sensitive     * MODERATE STAPHYLOCOCCUS AUREUS  Surgical PCR screen     Status: Abnormal   Collection Time: 01/28/22  5:31 AM   Specimen: Nasal Mucosa; Nasal Swab  Result Value Ref Range Status   MRSA, PCR NEGATIVE NEGATIVE Final   Staphylococcus aureus POSITIVE (A) NEGATIVE Final    Comment: (NOTE) The Xpert SA Assay (FDA approved for NASAL specimens in patients 32 years of age and older), is one component of a comprehensive surveillance program. It is not intended to diagnose infection nor to guide or monitor treatment. Performed at [facility], [place] [address], [place], [place] [address]   Aerobic/Anaerobic Culture w Gram Stain (surgical/deep wound)     Status: None (Preliminary result)   Collection Time: 01/29/22  8:18 AM   Specimen: Wound  Result Value Ref Range Status   Specimen Description WOUND  Final   Special Requests NONE  Final   Gram Stain   Final    ABUNDANT WBC PRESENT,BOTH PMN AND MONONUCLEAR NO ORGANISMS SEEN Performed at [facility], [address], [place], [place] [address]    Culture PENDING  Incomplete   Report Status PENDING  Incomplete    Alcide Evener, [place] [facility] [phone] pager  01/29/2022, 3:13 PM

## 2022-01-29 NOTE — Op Note (Signed)
OPERATIVE NOTE  DATE OF PROCEDURE: 01/29/2022  SURGEONS:  Primary: Orene Desanctis, MD  PREOPERATIVE DIAGNOSIS: Left wrist infection  POSTOPERATIVE DIAGNOSIS: Left wrist septic arthritis  NAME OF PROCEDURE:   Left wrist arthrotomy and I&D of midcarpal and radiocarpal joints  ANESTHESIA: General  SKIN PREPARATION: Hibiclens  ESTIMATED BLOOD LOSS: Minimal  IMPLANTS: none  SPECIMEN: culture to micro  INDICATIONS:  Max [name] is a 76 y.o. male who has the above preoperative diagnosis. The patient has decided to proceed with surgical intervention.  Risks, benefits and alternatives of operative management were discussed including, but not limited to, risks of anesthesia complications, infection, pain, persistent symptoms, stiffness, need for future surgery.  The patient understands, agrees and elects to proceed with surgery.    DESCRIPTION OF PROCEDURE: The patient was met in the pre-operative area and their identity was verified.  The operative location and laterality was also verified and marked.  The patient was brought to the OR and was placed supine on the table.  After repeat patient identification with the operative team anesthesia was provided and the patient was prepped and draped in the usual sterile fashion.  A final timeout was performed verifying the correction patient, procedure, location and laterality.  The left upper extremity was elevated and tourniquet inflated to 250 mmHg at the left forearm.  A dorsal longitudinal incision was made across the wrist joint.  Skin and subcutaneous tissues were divided and careful hemostasis was obtained.  Sensory nerve branches were protected.  The second third and fourth extensor compartments were identified and released from the retinaculum distal to the wrist.  These were retracted and there was clear effusion to the radiocarpal and midcarpal joints.  A longitudinal arthrotomy was made in the midcarpal joint as well as at the radiocarpal joint.   Gross purulence was identified within the joint.  Cultures were obtained.  Antibiotics were provided at this time.  The joints were irrigated with normal saline 3 L via low flow cystoscopy tubing.  The joints were inspected and there was some degeneration throughout the midcarpal and radiocarpal joints however some cartilage was still remaining.  No further purulence present no other fluctuant areas about the wrist.  The wrist capsule was left open and the skin was closed with 4-0 nylon suture in horizontal mattress fashion.  Sterile soft bandage was applied.  The tourniquet was deflated and the fingers were pink and warm and well-perfused.  All counts were correct x2.  The patient was awoken from anesthesia and brought to PACU for recovery in stable condition.  Postoperative plan: Follow-up intraoperative cultures and tailor antibiotics according to sensitivities.  Due to septic arthritis would recommend infectious disease consultation for duration and IV versus p.o. antibiotics.  No restrictions to the left wrist.  He can perform wrist range of motion and digit range of motion as tolerated.  The bandage can be changed as needed however if it is clean and dry it can be left in place for 10 to 14 days.  The patient will follow-up with me in 10 days for repeat clinical exam in the office.   [name], MD

## 2022-01-29 NOTE — Progress Notes (Signed)
Pharmacy Antibiotic Note  Max [name] is a 76 y.o. male admitted on 01/26/2022 with  septic arthritis .  Joint cultures grew out staph aureus. MRSA PCR neg. Wound cultures are still pending. Pharmacy has been consulted for cefazolin dosing.   Plan: Cefazolin 2g IV q8h Monitor CBC, renal function, and fevers  F/u culture data from wound cx   Height: 5' 6.5" (168.9 cm) Weight: 72.6 kg (160 lb) IBW/kg (Calculated) : 64.95  Temp (24hrs), Avg:97.7 F (36.5 C), Min:97.6 F (36.4 C), Max:97.7 F (36.5 C)  Recent Labs  Lab 01/27/22 0004 01/28/22 0342 01/29/22 1037  WBC 10.3 8.0 6.3  CREATININE 1.59* 1.57* 1.18    Estimated Creatinine Clearance: 49.7 mL/min (by C-G formula based on SCr of 1.18 mg/dL).    Allergies  Allergen Reactions   Zocor [Simvastatin] Other (See Comments)    Myositis    Antimicrobials this admission: 8/29 vanc >> 9/1 9/1 cefazolin >>   Dose adjustments this admission:  Microbiology results: 8/30: joint fluid cx: staph aureus 8/31: MSSA PCR pos 8/31: MRSA PCR neg 9/1: Wound cx: abundant WBC, no orgs (still pending)  Thank you for allowing pharmacy to be a part of this patient's care.  Wilson Singer, PharmD Clinical Pharmacist 01/29/2022 3:23 PM

## 2022-01-30 DIAGNOSIS — M00032 Staphylococcal arthritis, left wrist: Principal | ICD-10-CM

## 2022-01-30 DIAGNOSIS — I10 Essential (primary) hypertension: Secondary | ICD-10-CM | POA: Diagnosis not present

## 2022-01-30 DIAGNOSIS — F419 Anxiety disorder, unspecified: Secondary | ICD-10-CM | POA: Diagnosis not present

## 2022-01-30 DIAGNOSIS — N179 Acute kidney failure, unspecified: Secondary | ICD-10-CM | POA: Diagnosis not present

## 2022-01-30 LAB — HEPATITIS B SURFACE ANTIBODY, QUANTITATIVE: Hep B S AB Quant (Post): <3.1 m[IU]/mL — ABNORMAL LOW (ref >9.9)

## 2022-01-30 LAB — CBC
HCT: 29.3 % — ABNORMAL LOW (ref 39.0–52.0)
Hemoglobin: 9.6 g/dL — ABNORMAL LOW (ref 13.0–17.0)
MCH: 32.3 pg (ref 26.0–34.0)
MCHC: 32.8 g/dL (ref 30.0–36.0)
MCV: 98.7 fL (ref 80.0–100.0)
Platelets: 254 10*3/uL (ref 150–400)
RBC: 2.97 MIL/uL — ABNORMAL LOW (ref 4.22–5.81)
RDW: 16 % — ABNORMAL HIGH (ref 11.5–15.5)
WBC: 5.3 10*3/uL (ref 4.0–10.5)
nRBC: 0 % (ref 0.0–0.2)

## 2022-01-30 LAB — BASIC METABOLIC PANEL
Anion gap: 6 (ref 5–15)
BUN: 17 mg/dL (ref 8–23)
CO2: 19 mmol/L — ABNORMAL LOW (ref 22–32)
Calcium: 8.2 mg/dL — ABNORMAL LOW (ref 8.9–10.3)
Chloride: 112 mmol/L — ABNORMAL HIGH (ref 98–111)
Creatinine, Ser: 1.13 mg/dL (ref 0.61–1.24)
GFR, Estimated: >60 mL/min (ref >60)
Glucose, Bld: 51 mg/dL — ABNORMAL LOW (ref 70–99)
Potassium: 3.9 mmol/L (ref 3.5–5.1)
Sodium: 137 mmol/L (ref 135–145)

## 2022-01-30 LAB — GLUCOSE, CAPILLARY: Glucose-Capillary: 118 mg/dL — ABNORMAL HIGH (ref 70–99)

## 2022-01-30 NOTE — Discharge Summary (Signed)
Physician Discharge Summary   Patient: Max [name] MRN: [id number] DOB: 1946-03-14  Admit date:     01/26/2022  Discharge date: 01/30/22  Discharge Physician: Cordelia Poche, MD   PCP: Binnie Rail, MD   Recommendations at discharge:  Orthopedic surgery follow-up Infectious disease follow-up Cefadroxil for 6 weeks Final wound cultures  Discharge Diagnoses: Principal Problem:   Septic arthritis [facility]) Active Problems:   Diabetes ([place])   Anxiety   Essential hypertension   AKI (acute kidney injury) ([place])  Resolved Problems:   * No resolved hospital problems. *  Hospital Course: Max [name] is a 76 y.o. male with a history of hypertension, diabetes mellitus type 2, psoriasis, left ankle septic arthritis. Patient presented secondary to left hand swelling and pain with evidence of septic arthritis on clinical examination. Orthopedic surgery consulted and performed bedside I&D. Empiric Vancomycin initiated. Wound cultures obtained. ID consulted and initially recommended IV antibiotics for which the patient declined. Recommendation for 6 weeks of cefadroxil on discharge.  Assessment and Plan:  Left wrist septic arthritis Associated WBC of 10.3 on admission. Empiric Vancomycin IV initiated. No blood cultures obtained. Bedside joint aspiration (8/30) performed in the ED by orthopedic surgery. Wound culture (8/30) significant for MSSA. Orthopedic surgery performed left wrist arthrotomy and I&D in the OR on 9/1. Infectious disease was consulted. Empiric vancomycin was transitioned to Cefazolin prior to discharge recommendations for 6 weeks of Cefadroxil. Patient to follow-up with ID and orthopedic surgery as an outpatient.   AKI Baseline creatinine of 1.2. Creatinine peak of 1.59 on admission. Resolved with IV fluids.   Primary hypertension Patient is on amlodipine and lisinopril as an outpatient. Lisinopril held secondary to AKI. Continue amlodipine.   Diabetes mellitus type  2 Diabetes is uncontrolled hyperglycemia and hypoglycemia. Most recent hemoglobin A1C of 7.6%. Patient is on insulin glargine and metformin as an outpatient. Patient reports issues with hypoglycemia at times. Patient with hypoglycemia this morning; blood glucose of 34 that responded with D50. Resume home regimen on discharge.   Psoriasis It appears prednisone was not prescribed for management of psoriasis but rather for treatment of wrist pain by EDP on 8/28. Prednisone discontinued.   Anxiety Continue Prozac   Consultants: Orthopedic surgery, Infectious disease Procedures performed: Needle aspiration (8/30). Left wrist arthrotomy and I&D of midcarpal and radiocarpal joints (9/1) Disposition: Home Diet recommendation: Carb modified diet  DISCHARGE MEDICATION: Allergies as of 01/30/2022       Reactions   Zocor [simvastatin] Other (See Comments)   Myositis        Medication List     STOP taking these medications    predniSONE 20 MG tablet Commonly known as: DELTASONE       TAKE these medications    albuterol 108 (90 Base) MCG/ACT inhaler Commonly known as: VENTOLIN HFA Inhale 2 puffs into the lungs 4 (four) times daily as needed for wheezing or shortness of breath.   amLODipine 10 MG tablet Commonly known as: NORVASC Take 1 tablet (10 mg total) by mouth daily.   aspirin 81 MG chewable tablet Chew 81 mg by mouth daily.   cefadroxil 500 MG capsule Commonly known as: DURICEF Take 2 capsules (1,000 mg total) by mouth 2 (two) times daily.   FLUoxetine 20 MG capsule Commonly known as: PROZAC Take 20 mg by mouth daily at 6 (six) AM.   insulin glargine-yfgn 100 UNIT/ML Pen Commonly known as: SEMGLEE Inject 28 Units into the skin daily.   lisinopril 10 MG tablet Commonly known as:  ZESTRIL Take 1 tablet (10 mg total) by mouth daily.   metFORMIN 1000 MG tablet Commonly known as: GLUCOPHAGE Take 1,000 mg by mouth 2 (two) times daily with a meal.   valACYclovir  1000 MG tablet Commonly known as: VALTREX Take 1,000 mg by mouth 3 (three) times daily.               Discharge Care Instructions  (From admission, onward)           Start     Ordered   01/30/22 0000  Leave dressing on - Keep it clean, dry, and intact until clinic visit        01/30/22 1226            Discharge Exam: BP 133/65 (BP Location: Right Arm)   Pulse 76   Temp 97.6 F (36.4 C) (Oral)   Resp 16   Ht 5' 6.5" (1.689 m)   Wt 72.6 kg   SpO2 100%   BMI 25.44 kg/m   General exam: Appears calm and comfortable Respiratory system: Clear to auscultation. Respiratory effort normal. Cardiovascular system: S1 & S2 heard, RRR. No murmurs, rubs, gallops or clicks. Gastrointestinal system: Abdomen is nondistended, soft and nontender. Normal bowel sounds heard. Central nervous system: Alert and oriented. No focal neurological deficits. Musculoskeletal: Left hand in surgical dressing Skin: No cyanosis. No rashes Psychiatry: Judgement and insight appear normal. Mood & affect appropriate.   Condition at discharge: stable  The results of significant diagnostics from this hospitalization (including imaging, microbiology, ancillary and laboratory) are listed below for reference.   Imaging Studies: DG Wrist Complete Left  Result Date: 01/25/2022 CLINICAL DATA:  Pt arrived via POV, c/o left arm swelling. States he could have hit it off bed but does not remember injuring arm. Denies any pain or swelling anywhere else. wrist pain, no known injury EXAM: LEFT WRIST - COMPLETE 3+ VIEW COMPARISON:  None available FINDINGS: No distal radius or ulnar fracture. Radiocarpal joint is intact. No carpal fracture. No soft tissue abnormality. IMPRESSION: No fracture or dislocation. Electronically Signed   By: Suzy Bouchard M.D.   On: 01/25/2022 10:44    Microbiology: Results for orders placed or performed during the hospital encounter of 01/26/22  Body fluid culture w Gram Stain      Status: None   Collection Time: 01/27/22  5:38 AM   Specimen: Joint, Left Wrist; Body Fluid  Result Value Ref Range Status   Specimen Description   Final    JOINT FLUID LT WRIST Performed at [facility] [address], [place], [place] [address]    Special Requests   Final    Normal Performed at [facility], [place] [address] [place], [place] [address]    Gram Stain   Final    RARE GRAM POSITIVE COCCI IN CLUSTERS ABUNDANT WBC PRESENT, PREDOMINANTLY PMN Performed at [facility], [place] [address], [place], [place] [address]    Culture MODERATE STAPHYLOCOCCUS AUREUS  Final   Report Status 01/29/2022 FINAL  Final   Organism ID, Bacteria STAPHYLOCOCCUS AUREUS  Final      Susceptibility   Staphylococcus aureus - MIC*    CIPROFLOXACIN <=0.5 SENSITIVE Sensitive     ERYTHROMYCIN <=0.25 SENSITIVE Sensitive     GENTAMICIN <=0.5 SENSITIVE Sensitive     OXACILLIN 0.5 SENSITIVE Sensitive     TETRACYCLINE <=1 SENSITIVE Sensitive     VANCOMYCIN <=0.5 SENSITIVE Sensitive     TRIMETH/SULFA <=10 SENSITIVE Sensitive  CLINDAMYCIN <=0.25 SENSITIVE Sensitive     RIFAMPIN <=0.5 SENSITIVE Sensitive     Inducible Clindamycin NEGATIVE Sensitive     * MODERATE STAPHYLOCOCCUS AUREUS  Surgical PCR screen     Status: Abnormal   Collection Time: 01/28/22  5:31 AM   Specimen: Nasal Mucosa; Nasal Swab  Result Value Ref Range Status   MRSA, PCR NEGATIVE NEGATIVE Final   Staphylococcus aureus POSITIVE (A) NEGATIVE Final    Comment: (NOTE) The Xpert SA Assay (FDA approved for NASAL specimens in patients 75 years of age and older), is one component of a comprehensive surveillance program. It is not intended to diagnose infection nor to guide or monitor treatment. Performed at [facility], [place] [address] [place], [place] [address]   Aerobic/Anaerobic Culture w Gram Stain (surgical/deep wound)     Status: None (Preliminary result)   Collection Time:  01/29/22  8:18 AM   Specimen: Wound  Result Value Ref Range Status   Specimen Description WOUND  Final   Special Requests NONE  Final   Gram Stain   Final    ABUNDANT WBC PRESENT,BOTH PMN AND MONONUCLEAR NO ORGANISMS SEEN Performed at [facility], [address] [place], [place] [address]    Culture PENDING  Incomplete   Report Status PENDING  Incomplete    Labs: CBC: Recent Labs  Lab 01/27/22 0004 01/28/22 0342 01/29/22 1037 01/30/22 0425  WBC 10.3 8.0 6.3 5.3  NEUTROABS 8.7*  --   --   --   HGB 11.8* 12.4* 11.1* 9.6*  HCT 36.5* 37.0* 33.5* 29.3*  MCV 98.9 96.6 96.5 98.7  PLT 331 338 317 846   Basic Metabolic Panel: Recent Labs  Lab 01/27/22 0004 01/28/22 0342 01/29/22 1037 01/30/22 0633  NA 137 136 137 137  K 4.0 4.5 3.7 3.9  CL 109 104 111 112*  CO2 21* 22 20* 19*  GLUCOSE 138* 297* 62* 51*  BUN 31* 26* 26* 17  CREATININE 1.59* 1.57* 1.18 1.13  CALCIUM 8.3* 8.6* 7.9* 8.2*    CBG: Recent Labs  Lab 01/29/22 0849 01/29/22 1158 01/29/22 1636 01/29/22 2120 01/30/22 0825  GLUCAP 91 99 94 65* 118*    Discharge time spent: 35 minutes.  Signed: Cordelia Poche, MD [facility] 01/30/2022

## 2022-01-30 NOTE — TOC Transition Note (Addendum)
Transition of Care [facility]) - CM/SW Discharge Note   Patient Details  Name: Max [name] MRN: [id number] Date of Birth: 07-15-1945  Transition of Care [facility]) CM/SW Contact:  Carles Collet, RN Phone Number: 01/30/2022, 12:57 PM   Clinical Narrative:     Patient states he has drug coverage throug [facility] Medicare and uses [facility]. Notified MD, Lonny Prude, and bedside nurse through secure chat that [facility] is not open today, if order was sent to [facility] today it cannot be filled and will need to be sent to [facility].  Also paged Dr Lonny Prude, awaiting callback.   Meds filled by TOC yesterday and are stored in Main pharmacy until DC. Nurse updated and requested to make sure patient leaves with meds in hand.      Patient Goals and CMS Choice        Discharge Placement                       Discharge Plan and Services                                     Social Determinants of Health (SDOH) Interventions     Readmission Risk Interventions     No data to display

## 2022-01-30 NOTE — Discharge Instructions (Signed)
Max [name],  You were in the hospital with an infected wrist. You had surgery to clean the wrist. You will need to be on antibiotics for several weeks. Please follow-up with the orthopedic surgeon and infectious disease doctors as recommended.

## 2022-01-30 NOTE — Plan of Care (Signed)

## 2022-02-01 ENCOUNTER — Encounter (HOSPITAL_COMMUNITY): Payer: Self-pay | Admitting: Orthopedic Surgery

## 2022-02-01 NOTE — Anesthesia Postprocedure Evaluation (Signed)
Anesthesia Post Note  Patient: Max [name]  Procedure(s) Performed: IRRIGATION AND DEBRIDEMENT LEFT WRIST JOINT (Left: Arm Lower)     Patient location during evaluation: PACU Anesthesia Type: General Level of consciousness: awake and alert Pain management: pain level controlled Vital Signs Assessment: post-procedure vital signs reviewed and stable Respiratory status: spontaneous breathing, nonlabored ventilation, respiratory function stable and patient connected to nasal cannula oxygen Cardiovascular status: blood pressure returned to baseline and stable Postop Assessment: no apparent nausea or vomiting Anesthetic complications: no   No notable events documented.  Last Vitals:  Vitals:   01/30/22 0406 01/30/22 0824  BP: 130/73 133/65  Pulse: 69 76  Resp: 16 16  Temp: 36.6 C 36.4 C  SpO2: 100% 100%    Last Pain:  Vitals:   01/30/22 0900  TempSrc:   PainSc: 0-No pain                 Belenda Cruise P Henri Baumler

## 2022-02-02 ENCOUNTER — Telehealth: Payer: Self-pay

## 2022-02-02 ENCOUNTER — Other Ambulatory Visit: Payer: Self-pay

## 2022-02-02 NOTE — Telephone Encounter (Signed)
Transition Care Management Unsuccessful Follow-up Telephone Call  Date of discharge and from where:  [place] [facility] 01-30-22 Dx: Septic arthritis  Attempts:  1st Attempt  Reason for unsuccessful TCM follow-up call:  Left voice message   Transition Care Management Follow-up Telephone Call Date of discharge and from where: [facility] 01-30-22 Dx: Septic arthritis How have you been since you were released from the hospital? Doing fine  Any questions or concerns? No  Items Reviewed: Did the pt receive and understand the discharge instructions provided? Yes  Medications obtained and verified? Yes  Other? No  Any new allergies since your discharge? No  Dietary orders reviewed? Yes Do you have support at home? Yes   Home Care and Equipment/Supplies: Were home health services ordered? no If so, what is the name of the agency? na  Has the agency set up a time to come to the patient's home? not applicable Were any new equipment or medical supplies ordered?  No What is the name of the medical supply agency? na Were you able to get the supplies/equipment? not applicable Do you have any questions related to the use of the equipment or supplies? No  Functional Questionnaire: (I = Independent and D = Dependent) ADLs: I  Bathing/Dressing- I  Meal Prep- I  Eating- I  Maintaining continence- I  Transferring/Ambulation- I  Managing Meds- I  Follow up appointments reviewed:  PCP Hospital f/u appt confirmed? No- pt driving and can't look at calendar- please call pt back to schedule this appt prior to 02-13-22  [facility] f/u appt confirmed? Yes  Scheduled to see Dr Tommy Medal  on 02-17-22 @ 1115am. Are transportation arrangements needed? No  If their condition worsens, is the pt aware to call PCP or go to the Emergency Dept.? Yes Was the patient provided with contact information for the PCP's office or ED? Yes Was to pt encouraged to call back with questions or concerns? Yes

## 2022-02-03 LAB — AEROBIC/ANAEROBIC CULTURE W GRAM STAIN (SURGICAL/DEEP WOUND)

## 2022-02-05 LAB — ACID FAST SMEAR (AFB, MYCOBACTERIA): Acid Fast Smear: NEGATIVE

## 2022-02-08 DIAGNOSIS — M13832 Other specified arthritis, left wrist: Secondary | ICD-10-CM | POA: Diagnosis not present

## 2022-02-08 DIAGNOSIS — M25532 Pain in left wrist: Secondary | ICD-10-CM | POA: Diagnosis not present

## 2022-02-17 ENCOUNTER — Encounter: Payer: Self-pay | Admitting: Infectious Disease

## 2022-02-17 ENCOUNTER — Other Ambulatory Visit: Payer: Self-pay

## 2022-02-17 ENCOUNTER — Telehealth: Payer: Self-pay

## 2022-02-17 ENCOUNTER — Ambulatory Visit (INDEPENDENT_AMBULATORY_CARE_PROVIDER_SITE_OTHER): Payer: Medicare Other | Admitting: Infectious Disease

## 2022-02-17 VITALS — BP 161/78 | HR 88 | Temp 98.1°F | Ht 66.0 in | Wt 149.0 lb

## 2022-02-17 DIAGNOSIS — Z85528 Personal history of other malignant neoplasm of kidney: Secondary | ICD-10-CM | POA: Diagnosis not present

## 2022-02-17 DIAGNOSIS — M00032 Staphylococcal arthritis, left wrist: Secondary | ICD-10-CM | POA: Diagnosis not present

## 2022-02-17 DIAGNOSIS — E782 Mixed hyperlipidemia: Secondary | ICD-10-CM

## 2022-02-17 DIAGNOSIS — I10 Essential (primary) hypertension: Secondary | ICD-10-CM | POA: Diagnosis not present

## 2022-02-17 MED ORDER — CEFADROXIL 500 MG PO CAPS: 1000.0000 mg | ORAL_CAPSULE | Freq: Two times a day (BID) | ORAL | 0 refills | Status: DC

## 2022-02-17 NOTE — Telephone Encounter (Signed)
-----   Message from Truman Hayward, MD sent at 02/17/2022  4:43 PM EDT ----- His inflammatory markers look much worse its important that he see his hand surgeon as soon as possible ----- Message ----- From: [facility] Lab Results In Sent: 02/17/2022   2:56 PM EDT To: Truman Hayward, MD

## 2022-02-17 NOTE — Progress Notes (Signed)
Subjective:  Chief complaint residual left wrist pain    Patient ID: Max [name], male    DOB: Feb 26, 1946, 76 y.o.   MRN: [id number]  HPI   Max [name] is a 76 y.o. male hypertension diabetes mellitus psoriasis remote history of left septic presented to the ER on August 29 after a 4-day history of his left wrist swelling and severe pain.   He was seen in the  ER where x-rays were unremarkable.  He had a bedside aspiration of the joint with culture taken which yielded methicillin sensitive Staphylococcus aureus.   He was started on vancomycin.   He is taken to the operating room  by Dr. Greta Doom who performed left wrist arthrotomy with I&D of midcarpal and radiocarpal joints.  Cultures were taken bacteria AFB and fungi.  Methicillin sensitive Staphylococcus aureus was also isolated from operative cultures.  The patient did not want to go home on IV antibiotics but instead want to be on pills so we started cefadroxil 2 tablets twice daily which she began on discharge.  He is tolerating his medications well and has had follow-up with surgery.  He has had his sutures removed he was wearing a cast but he found it constrictive he has some residual pain when he rotates his wrist up and down in the snuffbox region.    Past Medical History:  Diagnosis Date   ANXIETY, SITUATIONAL    DEGENERATIVE DISC DISEASE    DERMATITIS    DIABETES MELLITUS, TYPE II, ON INSULIN, CONTROLLED    Eosinophilia    HYPERLIPIDEMIA    HYPERTENSION    LOW BACK PAIN    PRURITUS 2011   RENAL CELL CANCER 01/25/2005   s/p partial nephrectomy   SEPTIC ARTHRITIS 07/2009   L ankle - MSSA   SHOULDER PAIN, RIGHT, CHRONIC     Past Surgical History:  Procedure Laterality Date   BACK SURGERY  04/2005   Dr. Annette Stable   I & D EXTREMITY Left 01/29/2022   Procedure: IRRIGATION AND DEBRIDEMENT LEFT WRIST JOINT;  Surgeon: Orene Desanctis, MD;  Location: [facility];  Service: Orthopedics;  Laterality: Left;  14mn   LAPAROSCOPIC  PARTIAL NEPHRECTOMY  01/25/05   Dr. GRisa Grill  REPAIR ANKLE LIGAMENT  04/2009   disatal fib/infx    Family History  Problem Relation Age of Onset   Asthma Father       Social History   Socioeconomic History   Marital status: Widowed    Spouse name: Not on file   Number of children: Not on file   Years of education: Not on file   Highest education level: Not on file  Occupational History   Not on file  Tobacco Use   Smoking status: Former    Types: Cigarettes    Quit date: 05/31/1994    Years since quitting: 27.7   Smokeless tobacco: Never   Tobacco comments:    Married, lives with wife. Former rTeacher, English as a foreign language Substance and Sexual Activity   Alcohol use: Yes    Comment: occasionally   Drug use: No   Sexual activity: Not on file  Other Topics Concern   Not on file  Social History Narrative   Not on file   Social Determinants of Health   Financial Resource Strain: Low Risk  (10/12/2021)   Overall Financial Resource Strain (CARDIA)    Difficulty of Paying Living Expenses: Not hard at all  Food Insecurity: No Food Insecurity (10/12/2021)   Hunger Vital Sign  Worried About Charity fundraiser in the Last Year: Never true    [place] in the Last Year: Never true  Transportation Needs: No Transportation Needs (10/12/2021)   PRAPARE - Hydrologist (Medical): No    Lack of Transportation (Non-Medical): No  Physical Activity: Sufficiently Active (10/12/2021)   Exercise Vital Sign    Days of Exercise per Week: 5 days    Minutes of Exercise per Session: 30 min  Stress: No Stress Concern Present (10/12/2021)   [place]    Feeling of Stress : Not at all  Social Connections: Moderately Integrated (10/12/2021)   Social Connection and Isolation Panel [NHANES]    Frequency of Communication with Friends and Family: More than three times a week    Frequency of Social Gatherings  with Friends and Family: More than three times a week    Attends Religious Services: More than 4 times per year    Active Member of [employer] or Organizations: Yes    Attends Archivist Meetings: More than 4 times per year    Marital Status: Widowed    Allergies  Allergen Reactions   Zocor [Simvastatin] Other (See Comments)    Myositis     Current Outpatient Medications:    albuterol (VENTOLIN HFA) 108 (90 Base) MCG/ACT inhaler, Inhale 2 puffs into the lungs 4 (four) times daily as needed for wheezing or shortness of breath., Disp: , Rfl:    amLODipine (NORVASC) 10 MG tablet, Take 1 tablet (10 mg total) by mouth daily., Disp: 90 tablet, Rfl: 1   insulin glargine-yfgn (SEMGLEE) 100 UNIT/ML Pen, Inject 28 Units into the skin daily., Disp: , Rfl:    lisinopril (ZESTRIL) 10 MG tablet, Take 1 tablet (10 mg total) by mouth daily., Disp: 90 tablet, Rfl: 3   metFORMIN (GLUCOPHAGE) 1000 MG tablet, Take 1,000 mg by mouth 2 (two) times daily with a meal. , Disp: , Rfl:    aspirin 81 MG chewable tablet, Chew 81 mg by mouth daily. (Patient not taking: Reported on 02/17/2022), Disp: , Rfl:    cefadroxil (DURICEF) 500 MG capsule, Take 2 capsules (1,000 mg total) by mouth 2 (two) times daily., Disp: 168 capsule, Rfl: 0   FLUoxetine (PROZAC) 20 MG capsule, Take 20 mg by mouth daily at 6 (six) AM., Disp: , Rfl:    valACYclovir (VALTREX) 1000 MG tablet, Take 1,000 mg by mouth 3 (three) times daily., Disp: , Rfl:    Review of Systems  Constitutional:  Negative for activity change, appetite change, chills, diaphoresis, fatigue, fever and unexpected weight change.  HENT:  Negative for congestion, rhinorrhea, sinus pressure, sneezing, sore throat and trouble swallowing.   Eyes:  Negative for photophobia and visual disturbance.  Respiratory:  Negative for cough, chest tightness, shortness of breath, wheezing and stridor.   Cardiovascular:  Negative for chest pain, palpitations and leg swelling.   Gastrointestinal:  Negative for abdominal distention, abdominal pain, anal bleeding, blood in stool, constipation, diarrhea, nausea and vomiting.  Genitourinary:  Negative for difficulty urinating, dysuria, flank pain and hematuria.  Musculoskeletal:  Positive for arthralgias. Negative for back pain, gait problem, joint swelling and myalgias.  Skin:  Positive for wound. Negative for color change, pallor and rash.  Neurological:  Negative for dizziness, tremors, weakness and light-headedness.  Hematological:  Negative for adenopathy. Does not bruise/bleed easily.  Psychiatric/Behavioral:  Negative for agitation, behavioral problems, confusion, decreased concentration, dysphoric mood  and sleep disturbance.        Objective:   Physical Exam Constitutional:      Appearance: He is well-developed.  HENT:     Head: Normocephalic and atraumatic.  Eyes:     Conjunctiva/sclera: Conjunctivae normal.  Cardiovascular:     Rate and Rhythm: Normal rate and regular rhythm.  Pulmonary:     Effort: Pulmonary effort is normal. No respiratory distress.     Breath sounds: No wheezing.  Abdominal:     General: There is no distension.     Palpations: Abdomen is soft.  Musculoskeletal:        General: Swelling present.     Cervical back: Normal range of motion and neck supple.  Skin:    General: Skin is warm and dry.     Coloration: Skin is not pale.     Findings: No erythema or rash.  Neurological:     General: No focal deficit present.     Mental Status: He is alert and oriented to person, place, and time.  Psychiatric:        Mood and Affect: Mood normal.        Behavior: Behavior normal.        Thought Content: Thought content normal.        Judgment: Judgment normal.     Left wrist 02/17/2022:         Assessment & Plan:   Left septic neuritis due to methicillin sensitive Staphylococcus aureus status post surgery:  I will check a sed rate CRP BMP with GFR and CBC with differential  today.  I want to extend his cefadroxil and see him at the end of October.  HTN: needs to be better controlled  I spent 34 minutes with the patient including than 50% of the time in face to face counseling of the patient regarding his septic arthritis, along with review of medical records in preparation for the visit and during the visit and in coordination of his care.

## 2022-02-17 NOTE — Telephone Encounter (Signed)
Spoke with Max [name], discussed that Max inflammatory markers have increased and that Max [name] would like him to see Max [name] as soon as possible. Patient reports he will call their office this afternoon to schedule appointment.   Beryle Flock, RN

## 2022-02-18 DIAGNOSIS — R700 Elevated erythrocyte sedimentation rate: Secondary | ICD-10-CM | POA: Diagnosis not present

## 2022-02-18 DIAGNOSIS — M25532 Pain in left wrist: Secondary | ICD-10-CM | POA: Diagnosis not present

## 2022-02-18 DIAGNOSIS — M13832 Other specified arthritis, left wrist: Secondary | ICD-10-CM | POA: Diagnosis not present

## 2022-02-18 LAB — CBC WITH DIFFERENTIAL/PLATELET
Absolute Monocytes: 566 cells/uL (ref 200–950)
Basophils Absolute: 30 cells/uL (ref 0–200)
Basophils Relative: 0.5 %
Eosinophils Absolute: 543 cells/uL — ABNORMAL HIGH (ref 15–500)
Eosinophils Relative: 9.2 %
HCT: 35.5 % — ABNORMAL LOW (ref 38.5–50.0)
Hemoglobin: 11.7 g/dL — ABNORMAL LOW (ref 13.2–17.1)
Lymphs Abs: 838 cells/uL — ABNORMAL LOW (ref 850–3900)
MCH: 31.3 pg (ref 27.0–33.0)
MCHC: 33 g/dL (ref 32.0–36.0)
MCV: 94.9 fL (ref 80.0–100.0)
MPV: 9.7 fL (ref 7.5–12.5)
Monocytes Relative: 9.6 %
Neutro Abs: 3924 cells/uL (ref 1500–7800)
Neutrophils Relative %: 66.5 %
Platelets: 405 10*3/uL — ABNORMAL HIGH (ref 140–400)
RBC: 3.74 10*6/uL — ABNORMAL LOW (ref 4.20–5.80)
RDW: 13.6 % (ref 11.0–15.0)
Total Lymphocyte: 14.2 %
WBC: 5.9 10*3/uL (ref 3.8–10.8)

## 2022-02-18 LAB — BASIC METABOLIC PANEL WITH GFR
BUN/Creatinine Ratio: 12 (calc) (ref 6–22)
BUN: 18 mg/dL (ref 7–25)
CO2: 25 mmol/L (ref 20–32)
Calcium: 8.9 mg/dL (ref 8.6–10.3)
Chloride: 105 mmol/L (ref 98–110)
Creat: 1.45 mg/dL — ABNORMAL HIGH (ref 0.70–1.28)
Glucose, Bld: 152 mg/dL — ABNORMAL HIGH (ref 65–99)
Potassium: 4.5 mmol/L (ref 3.5–5.3)
Sodium: 138 mmol/L (ref 135–146)
eGFR: 50 mL/min/{1.73_m2} — ABNORMAL LOW (ref >=60)

## 2022-02-18 LAB — C-REACTIVE PROTEIN: CRP: 17.3 mg/L — ABNORMAL HIGH (ref <8.0)

## 2022-02-18 LAB — SEDIMENTATION RATE: Sed Rate: 87 mm/h — ABNORMAL HIGH (ref 0–20)

## 2022-02-22 ENCOUNTER — Telehealth: Payer: Self-pay

## 2022-02-22 NOTE — Telephone Encounter (Signed)
Jeral Fruit, RN called from Dr. Greta Doom office at [facility]. Requested provider-to-provider consult between Dr. Greta Doom and Dr. Tommy Medal today regarding patient's antibiotics.  Sent direct Epic message to Dr. Tommy Medal with request and best phone number to reach Dr. Greta Doom.  Binnie Kand, RN

## 2022-02-22 NOTE — Telephone Encounter (Signed)
Received verbal confirmation from Dr. Tommy Medal that he connected with Dr. Greta Doom today.

## 2022-02-24 ENCOUNTER — Encounter (HOSPITAL_COMMUNITY): Payer: Self-pay | Admitting: Orthopedic Surgery

## 2022-02-24 ENCOUNTER — Other Ambulatory Visit: Payer: Self-pay

## 2022-02-24 NOTE — Progress Notes (Signed)
For Short Stay: COVID SWAB appointment date: N/A Date of COVID positive in last 90 days: N/A  Bowel Prep reminder:  N/A   For Anesthesia: PCP - Binnie Rail, MD last office visit note 08/26/21 in epic Cardiologist -   Chest x-ray - none within 1 year EKG - 01/26/22 in epic Stress Test -  greater than 2 years  ECHO - greater than 2 years  Cardiac Cath - greater than 2 years  Pacemaker/ICD device last checked:N/A Pacemaker orders received: N/A Device Rep notified: N/A  Spinal Cord Stimulator: N/A  Sleep Study - N/A CPAP - N/A  Fasting Blood Sugar - unknown Checks Blood Sugar ___1__ times a month Date and result of last Hgb A1c- 08/26/21 7.6 in epic  Blood Thinner Instructions: N/A Aspirin Instructions:  No Last Dose: 02/24/22  Activity level: Able to exercise without chest pain and/or shortness of breath    Anesthesia review: IDDM, HTN,  Patient denies shortness of breath, fever, cough and chest pain at PAT appointment   Patient verbalized understanding of instructions that were given to them at the PAT appointment. Patient was also instructed that they will need to review over the PAT instructions again at home before surgery.

## 2022-02-25 ENCOUNTER — Inpatient Hospital Stay (HOSPITAL_COMMUNITY): Payer: No Typology Code available for payment source | Admitting: Physician Assistant

## 2022-02-25 ENCOUNTER — Encounter (HOSPITAL_COMMUNITY)
Admission: RE | Disposition: A | Discharge status: Home Health | Payer: Self-pay | Source: Home / Self Care | Attending: Internal Medicine

## 2022-02-25 ENCOUNTER — Other Ambulatory Visit: Payer: Self-pay

## 2022-02-25 ENCOUNTER — Encounter (HOSPITAL_COMMUNITY): Payer: Self-pay | Admitting: Orthopedic Surgery

## 2022-02-25 ENCOUNTER — Inpatient Hospital Stay (HOSPITAL_COMMUNITY): Payer: No Typology Code available for payment source

## 2022-02-25 ENCOUNTER — Inpatient Hospital Stay (HOSPITAL_COMMUNITY)
Admission: RE | Admit: 2022-02-25 | Discharge: 2022-03-01 | DRG: 506 | Disposition: A | Discharge status: Home Health | Payer: No Typology Code available for payment source | Attending: Internal Medicine | Admitting: Internal Medicine

## 2022-02-25 DIAGNOSIS — Z8619 Personal history of other infectious and parasitic diseases: Secondary | ICD-10-CM

## 2022-02-25 DIAGNOSIS — D649 Anemia, unspecified: Secondary | ICD-10-CM

## 2022-02-25 DIAGNOSIS — Z79899 Other long term (current) drug therapy: Secondary | ICD-10-CM | POA: Diagnosis not present

## 2022-02-25 DIAGNOSIS — M00032 Staphylococcal arthritis, left wrist: Principal | ICD-10-CM | POA: Diagnosis present

## 2022-02-25 DIAGNOSIS — L409 Psoriasis, unspecified: Secondary | ICD-10-CM | POA: Diagnosis present

## 2022-02-25 DIAGNOSIS — Z87891 Personal history of nicotine dependence: Secondary | ICD-10-CM

## 2022-02-25 DIAGNOSIS — Z905 Acquired absence of kidney: Secondary | ICD-10-CM

## 2022-02-25 DIAGNOSIS — G8929 Other chronic pain: Secondary | ICD-10-CM | POA: Diagnosis present

## 2022-02-25 DIAGNOSIS — E785 Hyperlipidemia, unspecified: Secondary | ICD-10-CM | POA: Diagnosis present

## 2022-02-25 DIAGNOSIS — N179 Acute kidney failure, unspecified: Secondary | ICD-10-CM | POA: Diagnosis not present

## 2022-02-25 DIAGNOSIS — E1165 Type 2 diabetes mellitus with hyperglycemia: Secondary | ICD-10-CM | POA: Diagnosis present

## 2022-02-25 DIAGNOSIS — Z825 Family history of asthma and other chronic lower respiratory diseases: Secondary | ICD-10-CM | POA: Diagnosis not present

## 2022-02-25 DIAGNOSIS — Z7982 Long term (current) use of aspirin: Secondary | ICD-10-CM | POA: Diagnosis not present

## 2022-02-25 DIAGNOSIS — Z7984 Long term (current) use of oral hypoglycemic drugs: Secondary | ICD-10-CM | POA: Diagnosis not present

## 2022-02-25 DIAGNOSIS — Z85528 Personal history of other malignant neoplasm of kidney: Secondary | ICD-10-CM

## 2022-02-25 DIAGNOSIS — M009 Pyogenic arthritis, unspecified: Secondary | ICD-10-CM | POA: Diagnosis present

## 2022-02-25 DIAGNOSIS — Z01818 Encounter for other preprocedural examination: Secondary | ICD-10-CM

## 2022-02-25 DIAGNOSIS — Z794 Long term (current) use of insulin: Secondary | ICD-10-CM

## 2022-02-25 DIAGNOSIS — F419 Anxiety disorder, unspecified: Secondary | ICD-10-CM | POA: Diagnosis present

## 2022-02-25 DIAGNOSIS — D721 Eosinophilia, unspecified: Secondary | ICD-10-CM | POA: Diagnosis present

## 2022-02-25 DIAGNOSIS — L309 Dermatitis, unspecified: Secondary | ICD-10-CM | POA: Diagnosis present

## 2022-02-25 DIAGNOSIS — M25511 Pain in right shoulder: Secondary | ICD-10-CM | POA: Diagnosis present

## 2022-02-25 DIAGNOSIS — B9561 Methicillin susceptible Staphylococcus aureus infection as the cause of diseases classified elsewhere: Secondary | ICD-10-CM | POA: Diagnosis present

## 2022-02-25 DIAGNOSIS — N261 Atrophy of kidney (terminal): Secondary | ICD-10-CM | POA: Diagnosis present

## 2022-02-25 DIAGNOSIS — I10 Essential (primary) hypertension: Secondary | ICD-10-CM

## 2022-02-25 DIAGNOSIS — E1159 Type 2 diabetes mellitus with other circulatory complications: Secondary | ICD-10-CM | POA: Diagnosis present

## 2022-02-25 DIAGNOSIS — E1122 Type 2 diabetes mellitus with diabetic chronic kidney disease: Secondary | ICD-10-CM

## 2022-02-25 DIAGNOSIS — E119 Type 2 diabetes mellitus without complications: Secondary | ICD-10-CM

## 2022-02-25 DIAGNOSIS — N281 Cyst of kidney, acquired: Secondary | ICD-10-CM | POA: Diagnosis not present

## 2022-02-25 DIAGNOSIS — Z883 Allergy status to other anti-infective agents status: Secondary | ICD-10-CM

## 2022-02-25 DIAGNOSIS — E11649 Type 2 diabetes mellitus with hypoglycemia without coma: Secondary | ICD-10-CM | POA: Diagnosis not present

## 2022-02-25 DIAGNOSIS — M545 Low back pain, unspecified: Secondary | ICD-10-CM | POA: Diagnosis present

## 2022-02-25 DIAGNOSIS — M25532 Pain in left wrist: Secondary | ICD-10-CM | POA: Diagnosis present

## 2022-02-25 DIAGNOSIS — Z888 Allergy status to other drugs, medicaments and biological substances status: Secondary | ICD-10-CM

## 2022-02-25 HISTORY — DX: Psoriasis, unspecified: L40.9

## 2022-02-25 HISTORY — PX: ARTHROTOMY: SHX5728

## 2022-02-25 LAB — CBC WITH DIFFERENTIAL/PLATELET
Abs Immature Granulocytes: 0 10*3/uL (ref 0.00–0.07)
Basophils Absolute: 0 10*3/uL (ref 0.0–0.1)
Basophils Relative: 1 %
Eosinophils Absolute: 0.4 10*3/uL (ref 0.0–0.5)
Eosinophils Relative: 13 %
HCT: 34.3 % — ABNORMAL LOW (ref 39.0–52.0)
Hemoglobin: 10.8 g/dL — ABNORMAL LOW (ref 13.0–17.0)
Immature Granulocytes: 0 %
Lymphocytes Relative: 25 %
Lymphs Abs: 0.8 10*3/uL (ref 0.7–4.0)
MCH: 30.8 pg (ref 26.0–34.0)
MCHC: 31.5 g/dL (ref 30.0–36.0)
MCV: 97.7 fL (ref 80.0–100.0)
Monocytes Absolute: 0.4 10*3/uL (ref 0.1–1.0)
Monocytes Relative: 13 %
Neutro Abs: 1.5 10*3/uL — ABNORMAL LOW (ref 1.7–7.7)
Neutrophils Relative %: 48 %
Platelets: 280 10*3/uL (ref 150–400)
RBC: 3.51 MIL/uL — ABNORMAL LOW (ref 4.22–5.81)
RDW: 15.3 % (ref 11.5–15.5)
WBC: 3.1 10*3/uL — ABNORMAL LOW (ref 4.0–10.5)
nRBC: 0 % (ref 0.0–0.2)

## 2022-02-25 LAB — GLUCOSE, CAPILLARY
Glucose-Capillary: 116 mg/dL — ABNORMAL HIGH (ref 70–99)
Glucose-Capillary: 139 mg/dL — ABNORMAL HIGH (ref 70–99)
Glucose-Capillary: 140 mg/dL — ABNORMAL HIGH (ref 70–99)
Glucose-Capillary: 151 mg/dL — ABNORMAL HIGH (ref 70–99)
Glucose-Capillary: 186 mg/dL — ABNORMAL HIGH (ref 70–99)
Glucose-Capillary: 44 mg/dL — CL (ref 70–99)
Glucose-Capillary: 72 mg/dL (ref 70–99)
Glucose-Capillary: 87 mg/dL (ref 70–99)

## 2022-02-25 LAB — COMPREHENSIVE METABOLIC PANEL
ALT: 10 U/L (ref 0–44)
AST: 13 U/L — ABNORMAL LOW (ref 15–41)
Albumin: 3.2 g/dL — ABNORMAL LOW (ref 3.5–5.0)
Alkaline Phosphatase: 75 U/L (ref 38–126)
Anion gap: 6 (ref 5–15)
BUN: 15 mg/dL (ref 8–23)
CO2: 24 mmol/L (ref 22–32)
Calcium: 8.4 mg/dL — ABNORMAL LOW (ref 8.9–10.3)
Chloride: 109 mmol/L (ref 98–111)
Creatinine, Ser: 1.18 mg/dL (ref 0.61–1.24)
GFR, Estimated: >60 mL/min (ref >60)
Glucose, Bld: 95 mg/dL (ref 70–99)
Potassium: 3.8 mmol/L (ref 3.5–5.1)
Sodium: 139 mmol/L (ref 135–145)
Total Bilirubin: 0.5 mg/dL (ref 0.3–1.2)
Total Protein: 7.2 g/dL (ref 6.5–8.1)

## 2022-02-25 LAB — IRON AND TIBC
Iron: 51 ug/dL (ref 45–182)
Saturation Ratios: 19 % (ref 17.9–39.5)
TIBC: 269 ug/dL (ref 250–450)
UIBC: 218 ug/dL

## 2022-02-25 LAB — HEMOGLOBIN A1C
Hgb A1c MFr Bld: 7.2 % — ABNORMAL HIGH (ref 4.8–5.6)
Mean Plasma Glucose: 159.94 mg/dL

## 2022-02-25 SURGERY — ARTHROTOMY
Anesthesia: General | Laterality: Left

## 2022-02-25 MED ORDER — PROPOFOL 10 MG/ML IV BOLUS
INTRAVENOUS | Status: DC | PRN
  Administered 2022-02-25: 150 mg via INTRAVENOUS
  Administered 2022-02-25: 50 mg via INTRAVENOUS

## 2022-02-25 MED ORDER — ALBUTEROL SULFATE (2.5 MG/3ML) 0.083% IN NEBU: 2.5000 mg | INHALATION_SOLUTION | Freq: Four times a day (QID) | RESPIRATORY_TRACT | Status: DC | PRN

## 2022-02-25 MED ORDER — SUCCINYLCHOLINE CHLORIDE 200 MG/10ML IV SOSY
PREFILLED_SYRINGE | INTRAVENOUS | Status: DC | PRN
  Administered 2022-02-25: 100 mg via INTRAVENOUS

## 2022-02-25 MED ORDER — OXYCODONE HCL 5 MG PO TABS: 5.0000 mg | ORAL_TABLET | Freq: Once | ORAL | Status: DC | PRN

## 2022-02-25 MED ORDER — FENTANYL CITRATE PF 50 MCG/ML IJ SOSY
25.0000 ug | PREFILLED_SYRINGE | INTRAMUSCULAR | Status: DC | PRN
  Administered 2022-02-25: 50 ug via INTRAVENOUS

## 2022-02-25 MED ORDER — DEXTROSE 50 % IV SOLN
INTRAVENOUS | Status: AC
  Filled 2022-02-25: qty 50

## 2022-02-25 MED ORDER — BUPIVACAINE HCL 0.25 % IJ SOLN
INTRAMUSCULAR | Status: DC | PRN
  Administered 2022-02-25: 15 mL

## 2022-02-25 MED ORDER — CEFAZOLIN SODIUM-DEXTROSE 2-4 GM/100ML-% IV SOLN
2.0000 g | Freq: Once | INTRAVENOUS | Status: AC
  Administered 2022-02-25: 2 g via INTRAVENOUS
  Filled 2022-02-25: qty 100

## 2022-02-25 MED ORDER — BACITRACIN ZINC 500 UNIT/GM EX OINT
TOPICAL_OINTMENT | CUTANEOUS | Status: DC | PRN
  Administered 2022-02-25: 1 via TOPICAL

## 2022-02-25 MED ORDER — AMLODIPINE BESYLATE 10 MG PO TABS
10.0000 mg | ORAL_TABLET | Freq: Every day | ORAL | Status: DC
  Administered 2022-02-26 – 2022-03-01 (×4): 10 mg via ORAL
  Filled 2022-02-25 (×4): qty 1

## 2022-02-25 MED ORDER — ONDANSETRON HCL 4 MG/2ML IJ SOLN: 4.0000 mg | Freq: Once | INTRAMUSCULAR | Status: DC | PRN

## 2022-02-25 MED ORDER — FLUOXETINE HCL 20 MG PO CAPS
20.0000 mg | ORAL_CAPSULE | Freq: Every day | ORAL | Status: DC
  Administered 2022-02-26 – 2022-03-01 (×4): 20 mg via ORAL
  Filled 2022-02-25 (×4): qty 1

## 2022-02-25 MED ORDER — DEXAMETHASONE SODIUM PHOSPHATE 10 MG/ML IJ SOLN
INTRAMUSCULAR | Status: AC
  Filled 2022-02-25: qty 1

## 2022-02-25 MED ORDER — PHENYLEPHRINE HCL-NACL 20-0.9 MG/250ML-% IV SOLN
INTRAVENOUS | Status: DC | PRN
  Administered 2022-02-25: 80 ug/min via INTRAVENOUS

## 2022-02-25 MED ORDER — INSULIN ASPART 100 UNIT/ML IJ SOLN: 0.0000 [IU] | Freq: Three times a day (TID) | INTRAMUSCULAR | Status: DC

## 2022-02-25 MED ORDER — 0.9 % SODIUM CHLORIDE (POUR BTL) OPTIME
TOPICAL | Status: DC | PRN
  Administered 2022-02-25: 500 mL

## 2022-02-25 MED ORDER — FENTANYL CITRATE (PF) 100 MCG/2ML IJ SOLN
INTRAMUSCULAR | Status: DC | PRN
  Administered 2022-02-25: 100 ug via INTRAVENOUS

## 2022-02-25 MED ORDER — ORAL CARE MOUTH RINSE: 15.0000 mL | Freq: Once | OROMUCOSAL | Status: AC

## 2022-02-25 MED ORDER — SODIUM CHLORIDE 0.9 % IV SOLN: INTRAVENOUS | Status: DC

## 2022-02-25 MED ORDER — SODIUM CHLORIDE 0.9 % IR SOLN
Status: DC | PRN
  Administered 2022-02-25: 3000 mL

## 2022-02-25 MED ORDER — BACITRACIN ZINC 500 UNIT/GM EX OINT
TOPICAL_OINTMENT | CUTANEOUS | Status: AC
  Filled 2022-02-25: qty 28.35

## 2022-02-25 MED ORDER — DEXTROSE 50 % IV SOLN
INTRAVENOUS | Status: AC
  Administered 2022-02-25: 25 g
  Filled 2022-02-25: qty 50

## 2022-02-25 MED ORDER — LIDOCAINE 2% (20 MG/ML) 5 ML SYRINGE
INTRAMUSCULAR | Status: DC | PRN
  Administered 2022-02-25: 60 mg via INTRAVENOUS

## 2022-02-25 MED ORDER — DEXTROSE 50 % IV SOLN
12.5000 g | Freq: Once | INTRAVENOUS | Status: AC
  Administered 2022-02-25: 12.5 g via INTRAVENOUS

## 2022-02-25 MED ORDER — BUPIVACAINE HCL (PF) 0.25 % IJ SOLN
INTRAMUSCULAR | Status: AC
  Filled 2022-02-25: qty 30

## 2022-02-25 MED ORDER — ONDANSETRON HCL 4 MG/2ML IJ SOLN
INTRAMUSCULAR | Status: AC
  Filled 2022-02-25: qty 2

## 2022-02-25 MED ORDER — LIDOCAINE HCL (PF) 2 % IJ SOLN
INTRAMUSCULAR | Status: AC
  Filled 2022-02-25: qty 5

## 2022-02-25 MED ORDER — ACETAMINOPHEN 650 MG RE SUPP: 650.0000 mg | Freq: Four times a day (QID) | RECTAL | Status: DC | PRN

## 2022-02-25 MED ORDER — FENTANYL CITRATE (PF) 100 MCG/2ML IJ SOLN
INTRAMUSCULAR | Status: AC
  Filled 2022-02-25: qty 2

## 2022-02-25 MED ORDER — FENTANYL CITRATE PF 50 MCG/ML IJ SOSY
PREFILLED_SYRINGE | INTRAMUSCULAR | Status: AC
  Filled 2022-02-25: qty 2

## 2022-02-25 MED ORDER — ACETAMINOPHEN 325 MG PO TABS
650.0000 mg | ORAL_TABLET | Freq: Four times a day (QID) | ORAL | Status: DC | PRN
  Administered 2022-02-25: 650 mg via ORAL
  Filled 2022-02-25: qty 2

## 2022-02-25 MED ORDER — LACTATED RINGERS IV SOLN: INTRAVENOUS | Status: DC

## 2022-02-25 MED ORDER — OXYCODONE HCL 5 MG PO TABS: 5.0000 mg | ORAL_TABLET | ORAL | Status: DC | PRN

## 2022-02-25 MED ORDER — CEFAZOLIN SODIUM-DEXTROSE 2-4 GM/100ML-% IV SOLN
2.0000 g | Freq: Three times a day (TID) | INTRAVENOUS | Status: DC
  Administered 2022-02-26 – 2022-03-01 (×11): 2 g via INTRAVENOUS
  Filled 2022-02-25 (×15): qty 100

## 2022-02-25 MED ORDER — OXYCODONE HCL 5 MG/5ML PO SOLN: 5.0000 mg | Freq: Once | ORAL | Status: DC | PRN

## 2022-02-25 MED ORDER — CHLORHEXIDINE GLUCONATE 0.12 % MT SOLN
15.0000 mL | Freq: Once | OROMUCOSAL | Status: AC
  Administered 2022-02-25: 15 mL via OROMUCOSAL

## 2022-02-25 MED ORDER — ACETAMINOPHEN 10 MG/ML IV SOLN: 1000.0000 mg | Freq: Once | INTRAVENOUS | Status: DC | PRN

## 2022-02-25 MED ORDER — ONDANSETRON HCL 4 MG/2ML IJ SOLN
INTRAMUSCULAR | Status: DC | PRN
  Administered 2022-02-25: 4 mg via INTRAVENOUS

## 2022-02-25 MED ORDER — DEXTROSE 50 % IV SOLN
INTRAVENOUS | Status: DC | PRN
  Administered 2022-02-25: 12.5 g via INTRAVENOUS

## 2022-02-25 SURGICAL SUPPLY — 43 items
BLADE SURG 15 STRL LF DISP TIS (BLADE) IMPLANT
BLADE SURG 15 STRL SS (BLADE) ×1
BNDG ELASTIC 4X5.8 VLCR STR LF (GAUZE/BANDAGES/DRESSINGS) ×2 IMPLANT
CORD BIPOLAR FORCEPS 12FT (ELECTRODE) IMPLANT
COVER BACK TABLE 60X90IN (DRAPES) ×2 IMPLANT
CUFF TOURN SGL QUICK 18X4 (TOURNIQUET CUFF) IMPLANT
DRAPE EXTREMITY T 121X128X90 (DISPOSABLE) IMPLANT
DRAPE ORTHO SPLIT 77X108 STRL (DRAPES) ×1
DRAPE SHEET LG 3/4 BI-LAMINATE (DRAPES) ×2 IMPLANT
DRAPE SURG 17X23 STRL (DRAPES) ×2 IMPLANT
DRAPE SURG ORHT 6 SPLT 77X108 (DRAPES) ×2 IMPLANT
DRSG ADAPTIC 3X8 NADH LF (GAUZE/BANDAGES/DRESSINGS) IMPLANT
ELECT REM PT RETURN 15FT ADLT (MISCELLANEOUS) IMPLANT
GAUZE 4X4 16PLY ~~LOC~~+RFID DBL (SPONGE) ×2 IMPLANT
GAUZE SPONGE 4X4 12PLY STRL (GAUZE/BANDAGES/DRESSINGS) ×2 IMPLANT
GAUZE XEROFORM 1X8 LF (GAUZE/BANDAGES/DRESSINGS) IMPLANT
GLOVE BIO SURGEON STRL SZ7.5 (GLOVE) ×4 IMPLANT
GLOVE BIOGEL PI IND STRL 7.5 (GLOVE) ×2 IMPLANT
GLOVE SURG ORTHO 8.0 STRL STRW (GLOVE) ×2 IMPLANT
GOWN STRL REUS W/ TWL LRG LVL3 (GOWN DISPOSABLE) ×2 IMPLANT
GOWN STRL REUS W/TWL LRG LVL3 (GOWN DISPOSABLE) ×1
HANDPIECE INTERPULSE COAX TIP (DISPOSABLE)
HIBICLENS CHG 4% 4OZ BTL (MISCELLANEOUS) ×2 IMPLANT
KIT BASIN OR (CUSTOM PROCEDURE TRAY) ×2 IMPLANT
KIT TURNOVER KIT A (KITS) ×2 IMPLANT
MANIFOLD NEPTUNE II (INSTRUMENTS) ×2 IMPLANT
NDL HYPO 25X1 1.5 SAFETY (NEEDLE) IMPLANT
NEEDLE HYPO 25X1 1.5 SAFETY (NEEDLE) ×1 IMPLANT
NS IRRIG 1000ML POUR BTL (IV SOLUTION) ×2 IMPLANT
PAD CAST 4YDX4 CTTN HI CHSV (CAST SUPPLIES) IMPLANT
PADDING CAST COTTON 4X4 STRL (CAST SUPPLIES) ×1
SET HNDPC FAN SPRY TIP SCT (DISPOSABLE) IMPLANT
SPONGE T-LAP 4X18 ~~LOC~~+RFID (SPONGE) IMPLANT
SUT ETHILON 4 0 PS 2 18 (SUTURE) IMPLANT
SWAB COLLECTION DEVICE MRSA (MISCELLANEOUS) IMPLANT
SWAB CULTURE ESWAB REG 1ML (MISCELLANEOUS) IMPLANT
SYR 10ML LL (SYRINGE) ×2 IMPLANT
SYR BULB EAR ULCER 3OZ GRN STR (SYRINGE) ×2 IMPLANT
SYR CONTROL 10ML LL (SYRINGE) IMPLANT
TOWEL OR 17X26 10 PK STRL BLUE (TOWEL DISPOSABLE) ×2 IMPLANT
TUBING CONNECTING 10 (TUBING) ×2 IMPLANT
UNDERPAD 30X36 HEAVY ABSORB (UNDERPADS AND DIAPERS) ×2 IMPLANT
YANKAUER SUCT BULB TIP NO VENT (SUCTIONS) ×2 IMPLANT

## 2022-02-25 NOTE — Anesthesia Preprocedure Evaluation (Signed)
Anesthesia Evaluation  Patient identified by MRN, date of birth, ID band Patient awake    Reviewed: Allergy & Precautions, NPO status , Patient's Chart, lab work & pertinent test results  Airway Mallampati: II  TM Distance: >3 FB Neck ROM: Full    Dental no notable dental hx.    Pulmonary neg pulmonary ROS, former smoker,    Pulmonary exam normal breath sounds clear to auscultation       Cardiovascular hypertension, Normal cardiovascular exam Rhythm:Regular Rate:Normal     Neuro/Psych negative neurological ROS  negative psych ROS   GI/Hepatic negative GI ROS, Neg liver ROS,   Endo/Other  diabetes, Type 2, Insulin Dependent  Renal/GU negative Renal ROS  negative genitourinary   Musculoskeletal negative musculoskeletal ROS (+)   Abdominal   Peds negative pediatric ROS (+)  Hematology  (+) Blood dyscrasia, anemia ,   Anesthesia Other Findings   Reproductive/Obstetrics negative OB ROS                             Anesthesia Physical Anesthesia Plan  ASA: 3  Anesthesia Plan: General   Post-op Pain Management: Minimal or no pain anticipated   Induction: Intravenous  PONV Risk Score and Plan: 2 and Ondansetron and Treatment may vary due to age or medical condition  Airway Management Planned: LMA  Additional Equipment:   Intra-op Plan:   Post-operative Plan: Extubation in OR  Informed Consent: I have reviewed the patients History and Physical, chart, labs and discussed the procedure including the risks, benefits and alternatives for the proposed anesthesia with the patient or authorized representative who has indicated his/her understanding and acceptance.     Dental advisory given  Plan Discussed with: CRNA and Surgeon  Anesthesia Plan Comments:         Anesthesia Quick Evaluation

## 2022-02-25 NOTE — Progress Notes (Signed)
Pharmacy Antibiotic Note  Max [name]. is a 76 y.o. male admitted on 02/25/2022 with left wrist septic arthritis who is s/p I&D on 9/28.  Pharmacy has been consulted for cefazolin dosing by ID for osteomyelitis.   Today, 02/25/22 CrCl ~40 mL/min using SCr 1.45 from labs on 02/17/22  Plan: Cefazolin 2 g IV q8h Check SCr with AM labs tomorrow. Follow renal function for necessary dose adjustments  Height: '5\' 6"'$  (167.6 cm) Weight: 67.6 kg (149 lb) IBW/kg (Calculated) : 63.8  Temp (24hrs), Avg:97.5 F (36.4 C), Min:97.4 F (36.3 C), Max:97.5 F (36.4 C)  No results for input(s): "WBC", "CREATININE", "LATICACIDVEN", "VANCOTROUGH", "VANCOPEAK", "VANCORANDOM", "GENTTROUGH", "GENTPEAK", "GENTRANDOM", "TOBRATROUGH", "TOBRAPEAK", "TOBRARND", "AMIKACINPEAK", "AMIKACINTROU", "AMIKACIN" in the last 168 hours.  Estimated Creatinine Clearance: 39.7 mL/min (A) (by C-G formula based on SCr of 1.45 mg/dL (H)).    Allergies  Allergen Reactions   Zocor [Simvastatin] Other (See Comments)    Myositis    Antimicrobials this admission: cefazolin 9/28 >>   Dose adjustments this admission:  Microbiology results: 9/28 body fluid/wound:   Lenis Noon, PharmD 02/25/2022 6:05 PM

## 2022-02-25 NOTE — Anesthesia Procedure Notes (Signed)
Procedure Name: Intubation Date/Time: 02/25/2022 4:19 PM  Performed by: Cleda Daub, CRNAPre-anesthesia Checklist: Patient identified, Emergency Drugs available, Suction available and Patient being monitored Patient Re-evaluated:Patient Re-evaluated prior to induction Oxygen Delivery Method: Circle system utilized Preoxygenation: Pre-oxygenation with 100% oxygen Induction Type: IV induction Ventilation: Mask ventilation without difficulty Laryngoscope Size: Mac and 3 Grade View: Grade I Tube type: Oral Tube size: 7.5 mm Number of attempts: 1 Airway Equipment and Method: Stylet and Oral airway Placement Confirmation: ETT inserted through vocal cords under direct vision, positive ETCO2 and breath sounds checked- equal and bilateral Secured at: 21 cm Tube secured with: Tape Dental Injury: Teeth and Oropharynx as per pre-operative assessment  Comments: ETT indicated due to lack of seal with #4  LMA straight and #4 LMA with gastric port. Easy mask ventilation between the steps.

## 2022-02-25 NOTE — H&P (Signed)
Preoperative History & Physical Exam  Surgeon: [name], MD  Diagnosis: Left wrist septic arthritis  Planned Procedure: Procedure(s) (LRB): Left wrist arthrotomy; irrigation and debridement (Left)  History of Present Illness:   Patient is a 76 y.o. male with symptoms consistent with Left wrist septic arthritis who presents for surgical intervention. The risks, benefits and alternatives of surgical intervention were discussed and informed consent was obtained prior to surgery.  Past Medical History:  Past Medical History:  Diagnosis Date   ANXIETY, SITUATIONAL    DEGENERATIVE DISC DISEASE    DERMATITIS    DIABETES MELLITUS, TYPE II, ON INSULIN, CONTROLLED    Eosinophilia    HYPERLIPIDEMIA    HYPERTENSION    LOW BACK PAIN    PRURITUS 05/31/2009   Psoriasis    RENAL CELL CANCER 01/25/2005   s/p partial nephrectomy   SEPTIC ARTHRITIS 07/01/2009   L ankle - MSSA   SHOULDER PAIN, RIGHT, CHRONIC     Past Surgical History:  Past Surgical History:  Procedure Laterality Date   BACK SURGERY  04/2005   Dr. Annette Stable   I & D EXTREMITY Left 01/29/2022   Procedure: IRRIGATION AND DEBRIDEMENT LEFT WRIST JOINT;  Surgeon: Orene Desanctis, MD;  Location: [facility];  Service: Orthopedics;  Laterality: Left;  70mn   LAPAROSCOPIC PARTIAL NEPHRECTOMY  01/25/05   Dr. GRisa Grill  REPAIR ANKLE LIGAMENT  04/2009   disatal fib/infx    Medications:  Prior to Admission medications   Medication Sig Start Date End Date Taking? Authorizing Provider  amLODipine (NORVASC) 10 MG tablet Take 1 tablet (10 mg total) by mouth daily. 02/02/12  Yes LRowe Clack MD  diphenhydrAMINE (BENADRYL) 50 MG tablet Take 50 mg by mouth daily as needed for itching.   Yes [provider]  insulin glargine-yfgn (SEMGLEE) 100 UNIT/ML Pen Inject 28 Units into the skin daily. 08/13/21  Yes [provider]  lisinopril (ZESTRIL) 10 MG tablet Take 1 tablet (10 mg total) by mouth daily. 06/10/21  Yes BBinnie Rail MD  metFORMIN (GLUCOPHAGE) 1000 MG tablet Take 1,000 mg by mouth 2 (two) times daily with a meal.  12/09/10  Yes JBiagio Borg MD  triamcinolone ointment (KENALOG) 0.1 % Apply 1 Application topically 2 (two) times daily as needed (itching).   Yes [provider]  albuterol (VENTOLIN HFA) 108 (90 Base) MCG/ACT inhaler Inhale 2 puffs into the lungs 4 (four) times daily as needed for wheezing or shortness of breath. 04/14/21   [provider]  aspirin EC 81 MG tablet Take 81 mg by mouth 3 (three) times a week. 11/18/09   [provider]  cefadroxil (DURICEF) 500 MG capsule Take 2 capsules (1,000 mg total) by mouth 2 (two) times daily. Patient not taking: Reported on 02/24/2022 02/17/22   VTommy Medal CLavell Islam MD  FLUoxetine (PROZAC) 20 MG capsule Take 20 mg by mouth daily. 04/29/21   [provider]    Allergies:  Zocor [simvastatin]  Review of Systems: Negative except per HPI.  Physical Exam: Alert and oriented, NAD Head and neck: no masses, normal alignment CV: pulse intact Pulm: no increased work of breathing, respirations even and unlabored Abdomen: non-distended Extremities: extremities warm and well perfused  LABS: Recent Results (from the past 2160 hour(s))  CBC with Differential     Status: Abnormal   Collection Time: 01/27/22 12:04 AM  Result Value Ref Range   WBC 10.3 4.0 - 10.5 K/uL   RBC 3.69 (L) 4.22 -  5.81 MIL/uL   Hemoglobin 11.8 (L) 13.0 - 17.0 g/dL   HCT 36.5 (L) 39.0 - 52.0 %   MCV 98.9 80.0 - 100.0 fL   MCH 32.0 26.0 - 34.0 pg   MCHC 32.3 30.0 - 36.0 g/dL   RDW 16.7 (H) 11.5 - 15.5 %   Platelets 331 150 - 400 K/uL   nRBC 0.0 0.0 - 0.2 %   Neutrophils Relative % 84 %   Neutro Abs 8.7 (H) 1.7 - 7.7 K/uL   Lymphocytes Relative 7 %   Lymphs Abs 0.7 0.7 - 4.0 K/uL   Monocytes Relative 8 %   Monocytes Absolute 0.8 0.1 - 1.0 K/uL   Eosinophils Relative 1 %   Eosinophils Absolute 0.1 0.0 - 0.5 K/uL   Basophils Relative 0 %    Basophils Absolute 0.0 0.0 - 0.1 K/uL   Immature Granulocytes 0 %   Abs Immature Granulocytes 0.02 0.00 - 0.07 K/uL    Comment: Performed at [facility], [place] [address], [place], [place] [address]  Basic metabolic panel     Status: Abnormal   Collection Time: 01/27/22 12:04 AM  Result Value Ref Range   Sodium 137 135 - 145 mmol/L   Potassium 4.0 3.5 - 5.1 mmol/L   Chloride 109 98 - 111 mmol/L   CO2 21 (L) 22 - 32 mmol/L   Glucose, Bld 138 (H) 70 - 99 mg/dL    Comment: Glucose reference range applies only to samples taken after fasting for at least 8 hours.   BUN 31 (H) 8 - 23 mg/dL   Creatinine, Ser 1.59 (H) 0.61 - 1.24 mg/dL   Calcium 8.3 (L) 8.9 - 10.3 mg/dL   GFR, Estimated 45 (L) >60 mL/min    Comment: (NOTE) Calculated using the CKD-EPI Creatinine Equation (2021)    Anion gap 7 5 - 15    Comment: Performed at [facility], [place] [address], [place], [place] [address]  Sedimentation rate     Status: Abnormal   Collection Time: 01/27/22 12:04 AM  Result Value Ref Range   Sed Rate 57 (H) 0 - 16 mm/hr    Comment: Performed at [facility], [place] [address] [place], [place] [address]  C-reactive protein     Status: Abnormal   Collection Time: 01/27/22 12:04 AM  Result Value Ref Range   CRP 4.6 (H) <1.0 mg/dL    Comment: Performed at [facility] [address] [place], [place] [address]  Body fluid culture w Gram Stain     Status: None   Collection Time: 01/27/22  5:38 AM   Specimen: Joint, Left Wrist; Body Fluid  Result Value Ref Range   Specimen Description      JOINT FLUID LT WRIST Performed at [facility] [address] [place], [place] [address]    Special Requests      Normal Performed at [facility], [place] [address], [place], [place] [address]    Gram Stain      RARE GRAM POSITIVE COCCI IN CLUSTERS ABUNDANT WBC PRESENT, PREDOMINANTLY PMN Performed at [facility], [place] [address] [place], [place] [address]    Culture MODERATE STAPHYLOCOCCUS AUREUS    Report Status 01/29/2022 FINAL    Organism ID, Bacteria STAPHYLOCOCCUS AUREUS       Susceptibility   Staphylococcus aureus - MIC*    CIPROFLOXACIN <=0.5 SENSITIVE Sensitive     ERYTHROMYCIN <=0.25 SENSITIVE Sensitive  GENTAMICIN <=0.5 SENSITIVE Sensitive     OXACILLIN 0.5 SENSITIVE Sensitive     TETRACYCLINE <=1 SENSITIVE Sensitive     VANCOMYCIN <=0.5 SENSITIVE Sensitive     TRIMETH/SULFA <=10 SENSITIVE Sensitive     CLINDAMYCIN <=0.25 SENSITIVE Sensitive     RIFAMPIN <=0.5 SENSITIVE Sensitive     Inducible Clindamycin NEGATIVE Sensitive     * MODERATE STAPHYLOCOCCUS AUREUS  Glucose, capillary     Status: Abnormal   Collection Time: 01/27/22  8:51 PM  Result Value Ref Range   Glucose-Capillary 391 (H) 70 - 99 mg/dL    Comment: Glucose reference range applies only to samples taken after fasting for at least 8 hours.  CBC     Status: Abnormal   Collection Time: 01/28/22  3:42 AM  Result Value Ref Range   WBC 8.0 4.0 - 10.5 K/uL   RBC 3.83 (L) 4.22 - 5.81 MIL/uL   Hemoglobin 12.4 (L) 13.0 - 17.0 g/dL   HCT 37.0 (L) 39.0 - 52.0 %   MCV 96.6 80.0 - 100.0 fL   MCH 32.4 26.0 - 34.0 pg   MCHC 33.5 30.0 - 36.0 g/dL   RDW 15.9 (H) 11.5 - 15.5 %   Platelets 338 150 - 400 K/uL   nRBC 0.0 0.0 - 0.2 %    Comment: Performed at [facility] [address], [place], [place] [address]  Basic metabolic panel     Status: Abnormal   Collection Time: 01/28/22  3:42 AM  Result Value Ref Range   Sodium 136 135 - 145 mmol/L   Potassium 4.5 3.5 - 5.1 mmol/L   Chloride 104 98 - 111 mmol/L   CO2 22 22 - 32 mmol/L   Glucose, Bld 297 (H) 70 - 99 mg/dL    Comment: Glucose reference range applies only to samples taken after fasting for at least 8 hours.   BUN 26 (H) 8 - 23 mg/dL   Creatinine, Ser 1.57 (H) 0.61 - 1.24 mg/dL   Calcium 8.6 (L) 8.9 - 10.3 mg/dL   GFR, Estimated 46 (L) >60 mL/min     Comment: (NOTE) Calculated using the CKD-EPI Creatinine Equation (2021)    Anion gap 10 5 - 15    Comment: Performed at [facility] [address], [place], [place] [address]  Surgical PCR screen     Status: Abnormal   Collection Time: 01/28/22  5:31 AM   Specimen: Nasal Mucosa; Nasal Swab  Result Value Ref Range   MRSA, PCR NEGATIVE NEGATIVE   Staphylococcus aureus POSITIVE (A) NEGATIVE    Comment: (NOTE) The Xpert SA Assay (FDA approved for NASAL specimens in patients 59 years of age and older), is one component of a comprehensive surveillance program. It is not intended to diagnose infection nor to guide or monitor treatment. Performed at [facility], [place] [address], [place], [place] [address]   Glucose, capillary     Status: Abnormal   Collection Time: 01/28/22  8:04 AM  Result Value Ref Range   Glucose-Capillary 184 (H) 70 - 99 mg/dL    Comment: Glucose reference range applies only to samples taken after fasting for at least 8 hours.  Glucose, capillary     Status: Abnormal   Collection Time: 01/28/22 11:48 AM  Result Value Ref Range   Glucose-Capillary 133 (H) 70 - 99 mg/dL    Comment: Glucose reference range applies only to samples taken after fasting for at least 8 hours.  Glucose,  capillary     Status: Abnormal   Collection Time: 01/28/22  4:08 PM  Result Value Ref Range   Glucose-Capillary 168 (H) 70 - 99 mg/dL    Comment: Glucose reference range applies only to samples taken after fasting for at least 8 hours.  Glucose, capillary     Status: Abnormal   Collection Time: 01/28/22  9:43 PM  Result Value Ref Range   Glucose-Capillary 264 (H) 70 - 99 mg/dL    Comment: Glucose reference range applies only to samples taken after fasting for at least 8 hours.  Glucose, capillary     Status: Abnormal   Collection Time: 01/29/22  7:02 AM  Result Value Ref Range   Glucose-Capillary 34 (LL) 70 - 99 mg/dL    Comment: Glucose reference range applies only to  samples taken after fasting for at least 8 hours.  Glucose, capillary     Status: Abnormal   Collection Time: 01/29/22  7:48 AM  Result Value Ref Range   Glucose-Capillary 107 (H) 70 - 99 mg/dL    Comment: Glucose reference range applies only to samples taken after fasting for at least 8 hours.  Fungus Culture With Stain     Status: None (Preliminary result)   Collection Time: 01/29/22  8:18 AM   Specimen: Wound  Result Value Ref Range   Fungus Stain Final report     Comment: (NOTE) Performed At: [facility] [place], [place] [id number] Rush Farmer MD [id number]    Fungus (Mycology) Culture PENDING    Fungal Source WRIST     Comment: Performed at [facility], [place] [address] [place], [place] [address]  Aerobic/Anaerobic Culture w Gram Stain (surgical/deep wound)     Status: None   Collection Time: 01/29/22  8:18 AM   Specimen: Wound  Result Value Ref Range   Specimen Description WOUND    Special Requests NONE    Gram Stain      ABUNDANT WBC PRESENT,BOTH PMN AND MONONUCLEAR NO ORGANISMS SEEN    Culture      RARE STAPHYLOCOCCUS AUREUS NO ANAEROBES ISOLATED Performed at [facility], [place] [address] [place], [place] [address]    Report Status 02/03/2022 FINAL    Organism ID, Bacteria STAPHYLOCOCCUS AUREUS       Susceptibility   Staphylococcus aureus - MIC*    CIPROFLOXACIN <=0.5 SENSITIVE Sensitive     ERYTHROMYCIN <=0.25 SENSITIVE Sensitive     GENTAMICIN <=0.5 SENSITIVE Sensitive     OXACILLIN <=0.25 SENSITIVE Sensitive     TETRACYCLINE <=1 SENSITIVE Sensitive     VANCOMYCIN <=0.5 SENSITIVE Sensitive     TRIMETH/SULFA <=10 SENSITIVE Sensitive     CLINDAMYCIN <=0.25 SENSITIVE Sensitive     RIFAMPIN <=0.5 SENSITIVE Sensitive     Inducible Clindamycin NEGATIVE Sensitive     * RARE STAPHYLOCOCCUS AUREUS  Acid Fast Smear (AFB)     Status: None   Collection Time: 01/29/22  8:18 AM   Specimen: Wound  Result Value Ref Range   AFB  Specimen Processing Concentration    Acid Fast Smear Negative     Comment: (NOTE) Performed At: [facility] [address] [place], [place] [id number] Rush Farmer MD [id number]    Source (AFB) WRIST     Comment: Performed at [facility], [place] [address], [place], [place] [address]  Fungus Culture Result     Status: None   Collection Time: 01/29/22  8:18 AM  Result Value Ref  Range   Result 1 Comment     Comment: (NOTE) KOH/Calcofluor preparation:  no fungus observed. Performed At: [facility] [place], [place] [id number] Rush Farmer MD [id number]   Glucose, capillary     Status: None   Collection Time: 01/29/22  8:49 AM  Result Value Ref Range   Glucose-Capillary 91 70 - 99 mg/dL    Comment: Glucose reference range applies only to samples taken after fasting for at least 8 hours.  CBC     Status: Abnormal   Collection Time: 01/29/22 10:37 AM  Result Value Ref Range   WBC 6.3 4.0 - 10.5 K/uL   RBC 3.47 (L) 4.22 - 5.81 MIL/uL   Hemoglobin 11.1 (L) 13.0 - 17.0 g/dL   HCT 33.5 (L) 39.0 - 52.0 %   MCV 96.5 80.0 - 100.0 fL   MCH 32.0 26.0 - 34.0 pg   MCHC 33.1 30.0 - 36.0 g/dL   RDW 15.9 (H) 11.5 - 15.5 %   Platelets 317 150 - 400 K/uL   nRBC 0.0 0.0 - 0.2 %    Comment: Performed at [facility] [address], [place], [place] [address]  Basic metabolic panel     Status: Abnormal   Collection Time: 01/29/22 10:37 AM  Result Value Ref Range   Sodium 137 135 - 145 mmol/L   Potassium 3.7 3.5 - 5.1 mmol/L   Chloride 111 98 - 111 mmol/L   CO2 20 (L) 22 - 32 mmol/L   Glucose, Bld 62 (L) 70 - 99 mg/dL    Comment: Glucose reference range applies only to samples taken after fasting for at least 8 hours.   BUN 26 (H) 8 - 23 mg/dL   Creatinine, Ser 1.18 0.61 - 1.24 mg/dL   Calcium 7.9 (L) 8.9 - 10.3 mg/dL   GFR, Estimated >60 >60 mL/min    Comment: (NOTE) Calculated using the CKD-EPI Creatinine Equation (2021)    Anion gap  6 5 - 15    Comment: Performed at [facility] [address], [place], [place] [address]  Glucose, capillary     Status: None   Collection Time: 01/29/22 11:58 AM  Result Value Ref Range   Glucose-Capillary 99 70 - 99 mg/dL    Comment: Glucose reference range applies only to samples taken after fasting for at least 8 hours.  HIV Antibody (routine testing w rflx)     Status: None   Collection Time: 01/29/22  3:57 PM  Result Value Ref Range   HIV Screen 4th Generation wRfx Non Reactive Non Reactive    Comment: Performed at [facility], [place] [address] [place], [place] [address]  Hepatitis B surface antibody,quantitative     Status: Abnormal   Collection Time: 01/29/22  3:57 PM  Result Value Ref Range   Hep B S AB Quant (Post) <3.1 (L) Immunity>9.9 mIU/mL    Comment: (NOTE)  Status of Immunity                     Anti-HBs Level  ------------------                     -------------- Inconsistent with Immunity                   0.0 - 9.9 Consistent with Immunity                          >  9.9 Performed At: [facility] [place], [place] [id number] Rush Farmer MD [id number]   Hepatitis B surface antigen     Status: None   Collection Time: 01/29/22  3:57 PM  Result Value Ref Range   Hepatitis B Surface Ag NON REACTIVE NON REACTIVE    Comment: Performed at [facility] [address], [place], [place] [address]  Glucose, capillary     Status: None   Collection Time: 01/29/22  4:36 PM  Result Value Ref Range   Glucose-Capillary 94 70 - 99 mg/dL    Comment: Glucose reference range applies only to samples taken after fasting for at least 8 hours.  Glucose, capillary     Status: Abnormal   Collection Time: 01/29/22  9:20 PM  Result Value Ref Range   Glucose-Capillary 65 (L) 70 - 99 mg/dL    Comment: Glucose reference range applies only to samples taken after fasting for at least 8 hours.  CBC     Status: Abnormal   Collection Time:  01/30/22  4:25 AM  Result Value Ref Range   WBC 5.3 4.0 - 10.5 K/uL   RBC 2.97 (L) 4.22 - 5.81 MIL/uL   Hemoglobin 9.6 (L) 13.0 - 17.0 g/dL   HCT 29.3 (L) 39.0 - 52.0 %   MCV 98.7 80.0 - 100.0 fL   MCH 32.3 26.0 - 34.0 pg   MCHC 32.8 30.0 - 36.0 g/dL   RDW 16.0 (H) 11.5 - 15.5 %   Platelets 254 150 - 400 K/uL   nRBC 0.0 0.0 - 0.2 %    Comment: Performed at [facility] [address] [place], [place] [address]  Basic metabolic panel     Status: Abnormal   Collection Time: 01/30/22  6:33 AM  Result Value Ref Range   Sodium 137 135 - 145 mmol/L   Potassium 3.9 3.5 - 5.1 mmol/L   Chloride 112 (H) 98 - 111 mmol/L   CO2 19 (L) 22 - 32 mmol/L   Glucose, Bld 51 (L) 70 - 99 mg/dL    Comment: Glucose reference range applies only to samples taken after fasting for at least 8 hours.   BUN 17 8 - 23 mg/dL   Creatinine, Ser 1.13 0.61 - 1.24 mg/dL   Calcium 8.2 (L) 8.9 - 10.3 mg/dL   GFR, Estimated >60 >60 mL/min    Comment: (NOTE) Calculated using the CKD-EPI Creatinine Equation (2021)    Anion gap 6 5 - 15    Comment: Performed at [facility] [address], [place], [place] [address]  Glucose, capillary     Status: Abnormal   Collection Time: 01/30/22  8:25 AM  Result Value Ref Range   Glucose-Capillary 118 (H) 70 - 99 mg/dL    Comment: Glucose reference range applies only to samples taken after fasting for at least 8 hours.  C-reactive protein     Status: Abnormal   Collection Time: 02/17/22 10:52 AM  Result Value Ref Range   CRP 17.3 (H) <8.0 mg/L  Sedimentation rate     Status: Abnormal   Collection Time: 02/17/22 10:52 AM  Result Value Ref Range   Sed Rate 87 (H) 0 - 20 mm/h  BASIC METABOLIC PANEL WITH GFR     Status: Abnormal   Collection Time: 02/17/22 10:52 AM  Result Value Ref Range   Glucose, Bld 152 (H) 65 - 99 mg/dL    Comment: .  Fasting reference interval . For someone without known diabetes, a glucose value >125 mg/dL indicates that  they may have diabetes and this should be confirmed with a follow-up test. .    BUN 18 7 - 25 mg/dL   Creat 1.45 (H) 0.70 - 1.28 mg/dL   eGFR 50 (L) > OR = 60 mL/min/1.73m   BUN/Creatinine Ratio 12 6 - 22 (calc)   Sodium 138 135 - 146 mmol/L   Potassium 4.5 3.5 - 5.3 mmol/L   Chloride 105 98 - 110 mmol/L   CO2 25 20 - 32 mmol/L   Calcium 8.9 8.6 - 10.3 mg/dL  CBC with Differential/Platelet     Status: Abnormal   Collection Time: 02/17/22 10:52 AM  Result Value Ref Range   WBC 5.9 3.8 - 10.8 Thousand/uL   RBC 3.74 (L) 4.20 - 5.80 Million/uL   Hemoglobin 11.7 (L) 13.2 - 17.1 g/dL   HCT 35.5 (L) 38.5 - 50.0 %   MCV 94.9 80.0 - 100.0 fL   MCH 31.3 27.0 - 33.0 pg   MCHC 33.0 32.0 - 36.0 g/dL   RDW 13.6 11.0 - 15.0 %   Platelets 405 (H) 140 - 400 Thousand/uL   MPV 9.7 7.5 - 12.5 fL   Neutro Abs 3,924 1,500 - 7,800 cells/uL   Lymphs Abs 838 (L) 850 - 3,900 cells/uL   Absolute Monocytes 566 200 - 950 cells/uL   Eosinophils Absolute 543 (H) 15 - 500 cells/uL   Basophils Absolute 30 0 - 200 cells/uL   Neutrophils Relative % 66.5 %   Total Lymphocyte 14.2 %   Monocytes Relative 9.6 %   Eosinophils Relative 9.2 %   Basophils Relative 0.5 %  Glucose, capillary     Status: Abnormal   Collection Time: 02/25/22  1:39 PM  Result Value Ref Range   Glucose-Capillary 44 (LL) 70 - 99 mg/dL    Comment: Glucose reference range applies only to samples taken after fasting for at least 8 hours.   Comment 1 Notify RN   Glucose, capillary     Status: Abnormal   Collection Time: 02/25/22  2:11 PM  Result Value Ref Range   Glucose-Capillary 140 (H) 70 - 99 mg/dL    Comment: Glucose reference range applies only to samples taken after fasting for at least 8 hours.  Glucose, capillary     Status: None   Collection Time: 02/25/22  3:03 PM  Result Value Ref Range   Glucose-Capillary 87 70 - 99 mg/dL    Comment: Glucose reference range applies only to samples taken after fasting for at least 8  hours.  Glucose, capillary     Status: None   Collection Time: 02/25/22  3:25 PM  Result Value Ref Range   Glucose-Capillary 72 70 - 99 mg/dL    Comment: Glucose reference range applies only to samples taken after fasting for at least 8 hours.   Comment 1 Notify RN    Comment 2 Document in Chart    Comment 3 Call MD NNP PA CNM      Complete History and Physical exam available in the office notes  JOrene Desanctis

## 2022-02-25 NOTE — Anesthesia Postprocedure Evaluation (Signed)
Anesthesia Post Note  Patient: Max [name].  Procedure(s) Performed: Left wrist arthrotomy; irrigation and debridement (Left)     Patient location during evaluation: PACU Anesthesia Type: General Level of consciousness: awake and alert and oriented Pain management: pain level controlled Vital Signs Assessment: post-procedure vital signs reviewed and stable Respiratory status: spontaneous breathing, nonlabored ventilation and respiratory function stable Cardiovascular status: blood pressure returned to baseline and stable Postop Assessment: no apparent nausea or vomiting Anesthetic complications: no   No notable events documented.  Last Vitals:  Vitals:   02/25/22 1705 02/25/22 1717  BP: (!) 142/70 120/65  Pulse: 71 64  Resp: 14 16  Temp: (!) 36.3 C   SpO2: 100% 98%    Last Pain:  Vitals:   02/25/22 1705  TempSrc:   PainSc: 8                  Marquice Uddin A.

## 2022-02-25 NOTE — Progress Notes (Signed)
The patient arrived to the unit. He is alert oriented x4, ambulatory. Dressing to left forearm is clean dry and intact. Dressing is gauze, ace bandage. Pt has normal sensation and 3 + brachial pulse.

## 2022-02-25 NOTE — Progress Notes (Signed)
Pt transferred to Rm 1441. All belongings sent with pt. Report given to [place].

## 2022-02-25 NOTE — Transfer of Care (Signed)
Immediate Anesthesia Transfer of Care Note  Patient: Max [name].  Procedure(s) Performed: Left wrist arthrotomy; irrigation and debridement (Left)  Patient Location: PACU  Anesthesia Type:General  Level of Consciousness: awake, alert , oriented and patient cooperative  Airway & Oxygen Therapy: Patient Spontanous Breathing and Patient connected to face mask oxygen  Post-op Assessment: Report given to RN and Post -op Vital signs reviewed and stable  Post vital signs: Reviewed and stable  Last Vitals:  Vitals Value Taken Time  BP 142/70 02/25/22 1706  Temp    Pulse 64 02/25/22 1708  Resp 18 02/25/22 1708  SpO2 99 % 02/25/22 1708  Vitals shown include unvalidated device data.  Last Pain:  Vitals:   02/25/22 1340  TempSrc:   PainSc: 0-No pain      Patients Stated Pain Goal: 3 (96/72/27 7375)  Complications: No notable events documented.

## 2022-02-25 NOTE — Progress Notes (Signed)
Dr. Kalman Shan aware of 44 CBG , amp of d50 has been given . Pt alert  and oriented x 4 will continue to monitor cbg

## 2022-02-25 NOTE — Op Note (Signed)
OPERATIVE NOTE  DATE OF PROCEDURE: 02/25/2022  SURGEONS:  Primary: Orene Desanctis, MD  PREOPERATIVE DIAGNOSIS: Left wrist infection   POSTOPERATIVE DIAGNOSIS: Left wrist septic arthritis   NAME OF PROCEDURE:    Left wrist arthrotomy and I&D of midcarpal and radiocarpal joints   ANESTHESIA: General + Local   SKIN PREPARATION: Hibiclens   ESTIMATED BLOOD LOSS: Minimal   IMPLANTS: none   SPECIMEN: culture to micro   INDICATIONS:  Max [name] is a 76 y.o. male who has the above preoperative diagnosis. The patient has decided to proceed with surgical intervention.  Risks, benefits and alternatives of operative management were discussed including, but not limited to, risks of anesthesia complications, infection, pain, persistent symptoms, stiffness, need for future surgery.  The patient understands, agrees and elects to proceed with surgery.     DESCRIPTION OF PROCEDURE: The patient was met in the pre-operative area and their identity was verified.  The operative location and laterality was also verified and marked.  The patient was brought to the OR and was placed supine on the table.  After repeat patient identification with the operative team anesthesia was provided and the patient was prepped and draped in the usual sterile fashion.  A final timeout was performed verifying the correction patient, procedure, location and laterality.   The left upper extremity was elevated and tourniquet inflated to 250 mmHg at the left forearm.  A dorsal longitudinal incision was made across the wrist joint.  Skin and subcutaneous tissues were divided and careful hemostasis was obtained.  Sensory nerve branches were protected.  The second third and fourth extensor compartments were identified and released from the retinaculum distal to the wrist.  These were retracted and there was clear effusion to the radiocarpal and midcarpal joints.  A longitudinal arthrotomy was made in the midcarpal joint as well as at the  radiocarpal joint.  Gross purulence was identified within the joint.  Cultures were obtained.  Antibiotics were provided at this time.  The joints were irrigated with normal saline 3 L via low flow cystoscopy tubing.  The joints were inspected and there was some degeneration throughout the midcarpal and radiocarpal joints however some cartilage was still remaining.  No further purulence present no other fluctuant areas about the wrist.  The wrist capsule was left open and the skin was closed with 4-0 nylon suture in horizontal mattress fashion.  Sterile soft bandage was applied.  The tourniquet was deflated and the fingers were pink and warm and well-perfused.  All counts were correct x2.  The patient was awoken from anesthesia and brought to PACU for recovery in stable condition.   Postoperative plan: Follow-up intraoperative cultures and tailor antibiotics according to sensitivities.  Due to septic arthritis would recommend infectious disease consultation for duration and IV antibiotics.  No restrictions to the left wrist.  He can perform wrist range of motion and digit range of motion as tolerated.  The bandage can be changed as needed however if it is clean and dry it can be left in place for 10 to 14 days.  The patient will follow-up with me in 10 days for repeat clinical exam in the office  J. Sable Feil, MD Orthopaedic Hand Surgeon

## 2022-02-25 NOTE — H&P (Signed)
History and Physical    Patient: Max [name]. ZOX:[id number] DOB: Nov 11, 1945 DOA: 02/25/2022 DOS: the patient was seen and examined on 02/25/2022 PCP: Binnie Rail, MD  Patient coming from: Home  Chief Complaint: Left wrist pain.  HPI: Max [name]. is a 76 y.o. male with medical history significant of DM2, HTN, psoriasis. Presenting with left wrist pain. He has been followed by ID and hand surgery for septic arthritis. He was recently in the hospital for left wrist swelling. At that time it was noted that he has methicillin sensitive staph aureus from a joint aspiration. He was sent home on cefadroxil as he didn't want to take IV abx. He returns today for washout with hand surgery. He completed that procedure w/o incident. Hand surgery recommends admission for IV abx. They have spoke with ID, who will follow while he is in the hospital. TRH was called for admission. The patient denies any other aggravating or alleviating factors.   Review of Systems: As mentioned in the history of present illness. All other systems reviewed and are negative. Past Medical History:  Diagnosis Date   ANXIETY, SITUATIONAL    DEGENERATIVE DISC DISEASE    DERMATITIS    DIABETES MELLITUS, TYPE II, ON INSULIN, CONTROLLED    Eosinophilia    HYPERLIPIDEMIA    HYPERTENSION    LOW BACK PAIN    PRURITUS 05/31/2009   Psoriasis    RENAL CELL CANCER 01/25/2005   s/p partial nephrectomy   SEPTIC ARTHRITIS 07/01/2009   L ankle - MSSA   SHOULDER PAIN, RIGHT, CHRONIC    Past Surgical History:  Procedure Laterality Date   BACK SURGERY  04/2005   Dr. Annette Stable   I & D EXTREMITY Left 01/29/2022   Procedure: IRRIGATION AND DEBRIDEMENT LEFT WRIST JOINT;  Surgeon: Orene Desanctis, MD;  Location: [facility];  Service: Orthopedics;  Laterality: Left;  40mn   LAPAROSCOPIC PARTIAL NEPHRECTOMY  01/25/05   Dr. GRisa Grill  REPAIR ANKLE LIGAMENT  04/2009   disatal fib/infx   Social History:  reports that he quit smoking about 27  years ago. His smoking use included cigarettes. He has never used smokeless tobacco. He reports current alcohol use. He reports that he does not use drugs.  Allergies  Allergen Reactions   Zocor [Simvastatin] Other (See Comments)    Myositis    Family History  Problem Relation Age of Onset   Asthma Father     Prior to Admission medications   Medication Sig Start Date End Date Taking? Authorizing Provider  amLODipine (NORVASC) 10 MG tablet Take 1 tablet (10 mg total) by mouth daily. 02/02/12  Yes LRowe Clack MD  diphenhydrAMINE (BENADRYL) 50 MG tablet Take 50 mg by mouth daily as needed for itching.   Yes [provider]  insulin glargine-yfgn (SEMGLEE) 100 UNIT/ML Pen Inject 28 Units into the skin daily. 08/13/21  Yes [provider]  lisinopril (ZESTRIL) 10 MG tablet Take 1 tablet (10 mg total) by mouth daily. 06/10/21  Yes BBinnie Rail MD  metFORMIN (GLUCOPHAGE) 1000 MG tablet Take 1,000 mg by mouth 2 (two) times daily with a meal.  12/09/10  Yes JBiagio Borg MD  triamcinolone ointment (KENALOG) 0.1 % Apply 1 Application topically 2 (two) times daily as needed (itching).   Yes [provider]  albuterol (VENTOLIN HFA) 108 (90 Base) MCG/ACT inhaler Inhale 2 puffs into the lungs 4 (four) times daily as needed for wheezing or shortness of breath. 04/14/21  [provider]  aspirin EC 81 MG tablet Take 81 mg by mouth 3 (three) times a week. 11/18/09   [provider]  cefadroxil (DURICEF) 500 MG capsule Take 2 capsules (1,000 mg total) by mouth 2 (two) times daily. Patient not taking: Reported on 02/24/2022 02/17/22   Tommy Medal, Lavell Islam, MD  FLUoxetine (PROZAC) 20 MG capsule Take 20 mg by mouth daily. 04/29/21   [provider]    Physical Exam: Vitals:   02/24/22 1010 02/25/22 1329 02/25/22 1340 02/25/22 1705  BP:  (!) 159/70  (!) 142/70  Pulse:  71  71  Resp:  18  14  Temp:  (!) 97.5 F (36.4 C)  (!) 97.4 F (36.3 C)   TempSrc:  Oral    SpO2:  99%  100%  Weight: 67.6 kg  67.6 kg   Height: '5\' 6"'$  (1.676 m)      General: 76 y.o. male resting in bed in NAD Eyes: PERRL, normal sclera ENMT: Nares patent w/o discharge, orophaynx clear, dentition normal, ears w/o discharge/lesions/ulcers Neck: Supple, trachea midline Cardiovascular: RRR, +S1, S2, no m/g/r, equal pulses throughout Respiratory: CTABL, no w/r/r, normal WOB GI: BS+, NDNT, no masses noted, no organomegaly noted MSK: No e/c/c; left wrist bandaging is CDI Neuro: A&O x 3, no focal deficits Psyc: Appropriate interaction and affect, calm/cooperative  Data Reviewed:  Results are pending, will review when available.  Assessment and Plan: Left wrist septic arthritis     - admit to inpt, tele     - s/p washout with hand surgery; they will continue to follow; they are recommending IV abx; pharm consulted for cefazolin dosing     - per hand surgery, ID to see     - follow cultures     - pain control  DM2     - he was hypoglycemic this morning, will hold SSI for tonight     - DM diet, glucose monitoring     - check A1c  HTN     - hold home lisinopril d/t AKI     - continue home regimen otherwise  AKI     - renal US     - watch nephrotoxins     - fluids  Normocytic anemia     - no evidence of bleed     - check iron studies  Anxiety     - continue home regimen  Advance Care Planning:   Code Status: FULL  Consults: Hand surgery, ID  Family Communication: None at bedside  Severity of Illness: The appropriate patient status for this patient is INPATIENT. Inpatient status is judged to be reasonable and necessary in order to provide the required intensity of service to ensure the patient's safety. The patient's presenting symptoms, physical exam findings, and initial radiographic and laboratory data in the context of their chronic comorbidities is felt to place them at high risk for further clinical deterioration. Furthermore, it is not  anticipated that the patient will be medically stable for discharge from the hospital within 2 midnights of admission.   * I certify that at the point of admission it is my clinical judgment that the patient will require inpatient hospital care spanning beyond 2 midnights from the point of admission due to high intensity of service, high risk for further deterioration and high frequency of surveillance required.*  Time spent in coordination of this H&P: 45 minutes  Author: Jonnie Finner, DO 02/25/2022 5:23 PM  For on call review  [email or web address]

## 2022-02-26 ENCOUNTER — Encounter (HOSPITAL_COMMUNITY): Payer: Self-pay | Admitting: Orthopedic Surgery

## 2022-02-26 ENCOUNTER — Inpatient Hospital Stay: Payer: Self-pay

## 2022-02-26 DIAGNOSIS — E11649 Type 2 diabetes mellitus with hypoglycemia without coma: Secondary | ICD-10-CM

## 2022-02-26 DIAGNOSIS — M00032 Staphylococcal arthritis, left wrist: Secondary | ICD-10-CM | POA: Diagnosis not present

## 2022-02-26 DIAGNOSIS — N179 Acute kidney failure, unspecified: Secondary | ICD-10-CM

## 2022-02-26 DIAGNOSIS — Z794 Long term (current) use of insulin: Secondary | ICD-10-CM

## 2022-02-26 DIAGNOSIS — D649 Anemia, unspecified: Secondary | ICD-10-CM | POA: Diagnosis not present

## 2022-02-26 DIAGNOSIS — F419 Anxiety disorder, unspecified: Secondary | ICD-10-CM | POA: Diagnosis not present

## 2022-02-26 LAB — CBC
HCT: 33 % — ABNORMAL LOW (ref 39.0–52.0)
Hemoglobin: 10.4 g/dL — ABNORMAL LOW (ref 13.0–17.0)
MCH: 31 pg (ref 26.0–34.0)
MCHC: 31.5 g/dL (ref 30.0–36.0)
MCV: 98.2 fL (ref 80.0–100.0)
Platelets: 259 10*3/uL (ref 150–400)
RBC: 3.36 MIL/uL — ABNORMAL LOW (ref 4.22–5.81)
RDW: 15.3 % (ref 11.5–15.5)
WBC: 4.3 10*3/uL (ref 4.0–10.5)
nRBC: 0 % (ref 0.0–0.2)

## 2022-02-26 LAB — COMPREHENSIVE METABOLIC PANEL
ALT: 9 U/L (ref 0–44)
AST: 13 U/L — ABNORMAL LOW (ref 15–41)
Albumin: 2.6 g/dL — ABNORMAL LOW (ref 3.5–5.0)
Alkaline Phosphatase: 66 U/L (ref 38–126)
Anion gap: 5 (ref 5–15)
BUN: 17 mg/dL (ref 8–23)
CO2: 24 mmol/L (ref 22–32)
Calcium: 8.2 mg/dL — ABNORMAL LOW (ref 8.9–10.3)
Chloride: 109 mmol/L (ref 98–111)
Creatinine, Ser: 1.33 mg/dL — ABNORMAL HIGH (ref 0.61–1.24)
GFR, Estimated: 56 mL/min — ABNORMAL LOW (ref >60)
Glucose, Bld: 113 mg/dL — ABNORMAL HIGH (ref 70–99)
Potassium: 3.8 mmol/L (ref 3.5–5.1)
Sodium: 138 mmol/L (ref 135–145)
Total Bilirubin: 0.3 mg/dL (ref 0.3–1.2)
Total Protein: 6.3 g/dL — ABNORMAL LOW (ref 6.5–8.1)

## 2022-02-26 LAB — GLUCOSE, CAPILLARY
Glucose-Capillary: 104 mg/dL — ABNORMAL HIGH (ref 70–99)
Glucose-Capillary: 209 mg/dL — ABNORMAL HIGH (ref 70–99)
Glucose-Capillary: 150 mg/dL — ABNORMAL HIGH (ref 70–99)
Glucose-Capillary: 215 mg/dL — ABNORMAL HIGH (ref 70–99)

## 2022-02-26 MED ORDER — SODIUM CHLORIDE 0.9% FLUSH
10.0000 mL | Freq: Two times a day (BID) | INTRAVENOUS | Status: DC
  Administered 2022-02-26: 10 mL

## 2022-02-26 MED ORDER — CHLORHEXIDINE GLUCONATE CLOTH 2 % EX PADS
6.0000 | MEDICATED_PAD | Freq: Every day | CUTANEOUS | Status: DC
  Administered 2022-02-26: 6 via TOPICAL

## 2022-02-26 MED ORDER — SODIUM CHLORIDE 0.9% FLUSH: 10.0000 mL | INTRAVENOUS | Status: DC | PRN

## 2022-02-26 MED ORDER — CEFAZOLIN IV (FOR PTA / DISCHARGE USE ONLY): 2.0000 g | Freq: Three times a day (TID) | INTRAVENOUS | 0 refills | Status: AC

## 2022-02-26 NOTE — Consult Note (Signed)
Date of Admission:  02/25/2022          Reason for Consult: Left wrist septic arthritis methicillin sensitive Staphylococcus aureus  Referring Provider: Orene Desanctis, MD   Assessment:  Persistent left wrist septic arthritis due to methicillin sensitive Staphylococcus aureus status post a second surgery Hypertension Insulin dependent diabetes mellitus History of septic arthritis of shoulder  Plan:  Cefazolin 2 g IV every 8 hours We will order insertion of PICC line OPAT  Partner Dr. Juleen China will follow up on the culture data this weekend and I will back on Monday if the patient is still here.  I am comfortable with him discharging on cefazolin 2 g IV every 8 hours as I am confident that this is the same methicillin sensitive Staphylococcus aureus that we were dealing with last month  Diagnosis: Septic arthritis of L wrist  Culture Result: MSSA (before)  Allergies  Allergen Reactions   Zocor [Simvastatin] Other (See Comments)    Myositis    OPAT Orders Discharge antibiotics to be given via PICC line Discharge antibiotics: Cefazolin 2g Q8h Duration: 6 weeks End Date: 04/07/22  [facility] Care Per Protocol:  Home health RN for IV administration and teaching; PICC line care and labs.    Labs weekly while on IV antibiotics: _x_ CBC with differential _x_ BMP x_ CRP x__ ESR  _x_ Please pull PIC at completion of IV antibiotics __ Please leave PIC in place until doctor has seen patient or been notified  Fax weekly labs to [phone]  Clinic Follow Up Appt:   Max Faiz Weber. has an appointment on 03/24/2022 at 11AM with Dr. Tommy Medal at:  [facility], which  is located in the [facility] at  [address] in [place].  [address], which is located to the left of the elevators.  Phone: [phone]  Fax: [phone]  [email or web address]  The patient should arrive 30 minutes  prior to their appoitment.    Principal Problem:   Septic arthritis ([place]) Active Problems:   Diabetes ([place])   Anxiety   Essential hypertension   History of renal cell carcinoma   AKI (acute kidney injury) ([place])   Normocytic anemia   Scheduled Meds:  amLODipine  10 mg Oral Daily   FLUoxetine  20 mg Oral Daily   Continuous Infusions:  sodium chloride 75 mL/hr at 02/26/22 1049    ceFAZolin (ANCEF) IV 2 g (02/26/22 1246)   PRN Meds:.acetaminophen **OR** acetaminophen, albuterol, oxyCODONE  HPI: Max [name]. is a 76 y.o. male 7with hx of  hypertension diabetes mellitus psoriasis remote history of left septic presented to the ER on August 29 after a 4-day history of his left wrist swelling and severe pain.   He was seen in the  ER where x-rays were unremarkable.  He had a bedside aspiration of the joint with culture taken which yielded methicillin sensitive Staphylococcus aureus.   He was started on vancomycin.   He is taken to the operating room  by Dr. Greta Doom who performed left wrist arthrotomy with I&D of midcarpal and radiocarpal joints.  Cultures were taken bacteria AFB and fungi.   Methicillin sensitive Staphylococcus aureus was also isolated from operative cultures.   The patient did not want to go home on IV antibiotics but instead want to be on pills so we started cefadroxil 2 tablets which he continued on faithfully after dc.  Saw him in the office he still  was having pain in his wrist in the area of the snuffbox.  His inflammatory markers also had risen dramatically sed rate back up to 87 CRP up to 17.3.  The patient was evaluated by Dr. Greta Doom in the office who aspirated overtly purulent material from the patient's wrist.  Patient was scheduled for admission for I&D and placement of PICC line in coordination of IV antibiotics.  The plan was taken to the operating room yesterday and underwent left wrist arthrotomy and I&D of midcarpal and radiocarpal  joints.  Gross purulence within the joint which was irrigated.  Operative cultures have been obtained which we will follow.  Expect that this is still the same methicillin sensitive Staphylococcus aureus that is the culprit.  It may grow on the cultures or we may encounter no growth due to his chronic cefadroxil therapy prior to surgery.  He is agreeable now to IV antibiotics via PICC line and we will plan on 6 weeks of cefazolin unless a different organism is isolated.  I spent 84 minutes with the patient including than 50% of the time in face to face counseling of the patient re his septic arthritis due to methicillin sensitive Staphylococcus aureus,  along with review of medical records in preparation for the visit and during the visit and in coordination of nis care.     Review of Systems: ROS  Past Medical History:  Diagnosis Date   ANXIETY, SITUATIONAL    DEGENERATIVE DISC DISEASE    DERMATITIS    DIABETES MELLITUS, TYPE II, ON INSULIN, CONTROLLED    Eosinophilia    HYPERLIPIDEMIA    HYPERTENSION    LOW BACK PAIN    PRURITUS 05/31/2009   Psoriasis    RENAL CELL CANCER 01/25/2005   s/p partial nephrectomy   SEPTIC ARTHRITIS 07/01/2009   L ankle - MSSA   SHOULDER PAIN, RIGHT, CHRONIC     Social History   Tobacco Use   Smoking status: Former    Types: Cigarettes    Quit date: 05/31/1994    Years since quitting: 27.7   Smokeless tobacco: Never   Tobacco comments:    Married, 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 rate (!) 24, height '5\' 6"'  (1.676 m), weight 66.7 kg, SpO2 100  %.  Physical Exam  Lab Results Lab Results  Component Value Date   WBC 4.3 02/26/2022   HGB 10.4 (L) 02/26/2022   HCT 33.0 (L) 02/26/2022   MCV 98.2 02/26/2022   PLT 259 02/26/2022    Lab Results  Component Value Date   CREATININE 1.33 (H) 02/26/2022   BUN 17 02/26/2022   NA 138 02/26/2022   K 3.8 02/26/2022   CL 109 02/26/2022   CO2 24 02/26/2022    Lab Results  Component Value Date   ALT 9 02/26/2022   AST 13 (L) 02/26/2022   ALKPHOS 66 02/26/2022   BILITOT 0.3 02/26/2022     Microbiology: Recent Results (from the past 240 hour(s))  Aerobic/Anaerobic Culture w Gram Stain (surgical/deep wound)  Status: None (Preliminary result)   Collection Time: 02/25/22  4:39 PM   Specimen: PATH Cytology Misc. fluid; Body Fluid  Result Value Ref Range Status   Specimen Description   Final    SYNOVIAL LEFT WRIST Performed at [facility] [address], [place], [place] [address]    Special Requests   Final    NONE Performed at [facility], [place] [address] [place], [place] [address]    Gram Stain   Final    FEW WBC PRESENT,BOTH PMN AND MONONUCLEAR NO ORGANISMS SEEN    Culture   Final    NO GROWTH < 12 HOURS Performed at [facility], [place] [address], [place], [place] [address]    Report Status PENDING  Incomplete    Alcide Evener, [facility] [place] [phone] pager  02/26/2022, 12:54 PM

## 2022-02-26 NOTE — TOC Initial Note (Addendum)
Transition of Care (TOC) - Initial/Assessment Note    Patient Details  Name: Max [name]. MRN: [id number] Date of Birth: 05/27/1946  Transition of Care [facility]) CM/SW Contact:    Roseanne Kaufman, RN Phone Number: 02/26/2022, 3:56 PM  Clinical Narrative:    Call April with the [facility], regarding services the patient needs, awaiting a call back.  Spoke with patient who reports he has [employer] as well and agrees to using that insurance if needed.  Patient will need home abx, HHRN at discharge. Notified Pam with [facility] and Caryl Pina with [facility] who are both following. [facility] services will be set up for Monday 02/28/22, MD notified. [facility] is awaiting auth from [place] to initiate home antibiotic services. All services are pending [place] auth, which will not be until Monday.    TOC will continue to follow.               Expected Discharge Plan: [place] Barriers to Discharge: Continued Medical Work up   Patient Goals and CMS Choice Patient states their goals for this hospitalization and ongoing recovery are:: return home with home health services CMS Medicare.gov Compare Post Acute Care list provided to:: Patient Choice offered to / list presented to : Patient  Expected Discharge Plan and Services Expected Discharge Plan: [place] In-house Referral: NA Discharge Planning Services: CM Consult Post Acute Care Choice: [place] arrangements for the past 2 months: Single Family Home                 DME Arranged: N/A         HH Arranged: RN          Prior Living Arrangements/Services Living arrangements for the past 2 months: Single Family Home Lives with:: Relatives Patient language and need for interpreter reviewed:: Yes Do you feel safe going back to the place where you live?: Yes      Need for Family Participation in Patient Care: No (Comment) Care giver support system in place?: Yes (comment) Current home services: Other  (comment) (None) Criminal Activity/Legal Involvement Pertinent to Current Situation/Hospitalization: No - Comment as needed  Activities of Daily Living Home Assistive Devices/Equipment: Blood pressure cuff, CBG Meter, Dentures (specify type) ADL Screening (condition at time of admission) Patient's cognitive ability adequate to safely complete daily activities?: Yes Is the patient deaf or have difficulty hearing?: No Does the patient have difficulty seeing, even when wearing glasses/contacts?: No Does the patient have difficulty concentrating, remembering, or making decisions?: No Patient able to express need for assistance with ADLs?: Yes Does the patient have difficulty dressing or bathing?: No Independently performs ADLs?: Yes (appropriate for developmental age) Does the patient have difficulty walking or climbing stairs?: Yes Weakness of Legs: None Weakness of Arms/Hands: None  Permission Sought/Granted Permission sought to share information with : Case Manager Permission granted to share information with : Yes, Verbal Permission Granted  Share Information with NAME: Case manager, Son: Science writer III           Emotional Assessment Appearance:: Appears stated age Attitude/Demeanor/Rapport: Gracious Affect (typically observed): Accepting Orientation: : Oriented to Self, Oriented to Place, Oriented to  Time, Oriented to Situation Alcohol / Substance Use: Not Applicable Psych Involvement: No (comment)  Admission diagnosis:  Septic arthritis [facility]) [M00.9] Patient Active Problem List   Diagnosis Date Noted   Normocytic anemia 02/25/2022   AKI (acute kidney injury) ([place]) 01/28/2022   Statin-induced myositis 08/26/2021  Allergic reaction to detergent 06/10/2021   Numbness and tingling in right hand 06/19/2020   Olecranon bursitis, left elbow 01/30/2020   Cellulitis of right lower extremity 05/17/2017   Iron deficiency anemia 07/23/2016   Polyarthralgia 07/22/2016    Olecranon bursitis of right elbow 05/22/2014   SHOULDER PAIN, RIGHT, CHRONIC 06/10/2010   Anxiety 08/04/2009   Septic arthritis ([place]) 08/04/2009   EOSINOPHILIA 05/29/2009   Diabetes ([place]) 01/16/2009   DERMATITIS 01/16/2009   Hyperlipidemia 01/15/2009   Essential hypertension 01/15/2009   DEGENERATIVE [place] DISEASE 01/15/2009   LOW BACK PAIN 01/15/2009   History of renal cell carcinoma 01/25/2005   PCP:  Binnie Rail, MD Pharmacy:   [facility] [place], [place] [address] Phone: [phone] Fax: [phone]  [facility] [address] [place] [address] Phone: [phone] Fax: [phone]     Social Determinants of Health (SDOH) Interventions    Readmission Risk Interventions     No data to display

## 2022-02-26 NOTE — Progress Notes (Signed)
Progress Note    Clancy Elimelech Houseman.   GGE:[id number]  DOB: 25-Sep-1945  DOA: 02/25/2022     1 PCP: Binnie Rail, MD  Initial CC: Left wrist pain  Hospital Course: Mr. Grosser is a 76 yo male with PMH DMII, HTN, psoriasis who presented with left wrist pain.  He has been previously followed by ID and orthopedic surgery for MSSA septic arthritis.  He underwent left wrist arthrotomy and I&D on 02/25/2022 with orthopedic surgery.  He was evaluated by infectious disease and initiated on Ancef to be continued at discharge via PICC line.  Interval History:  Sitting in chair bedside when seen this afternoon.  Left forearm wrapped in surgical bandage.  Pain is controlled and patient was comfortable.  He understands plan for PICC line placement and outpatient antibiotics.  Assessment and Plan:  Left wrist septic arthritis -Previous cultures growing MSSA from 01/28/2022 -Patient initially had declined IV antibiotics during prior hospitalization -ID following.  Plan is now for Ancef via PICC line at discharge   DM2 - continue SSI and CBG monitoring    HTN -Continue amlodipine   AKI -Renal ultrasound obtained on admission, left renal atrophy and cyst noted; normal right kidney - trend renal fxn  Normocytic anemia - iron levels normal    Anxiety -Continue Prozac   Old records reviewed in assessment of this patient  Antimicrobials: Ancef 02/25/2022 >> current  DVT prophylaxis:  SCDs Start: 02/25/22 1838   Code Status:   Code Status: Full Code  Mobility Assessment (last 72 hours)     Mobility Assessment     Row Name 02/26/22 1050 02/25/22 2005 02/25/22 1900       Does patient have an order for bedrest or is patient medically unstable No - Continue assessment No - Continue assessment No - Continue assessment     What is the highest level of mobility based on the progressive mobility assessment? Level 6 (Walks independently in room and hall) - Balance while walking in room  without assist - Complete Level 5 (Walks with assist in room/hall) - Balance while stepping forward/back and can walk in room with assist - Complete Level 6 (Walks independently in room and hall) - Balance while walking in room without assist - Complete              Barriers to discharge: None Disposition Plan: Home Saturday Status is: Inpatient  Objective: Blood pressure (!) 146/78, pulse 66, temperature 98.2 F (36.8 C), temperature source Oral, resp. rate (!) 24, height '5\' 6"'$  (1.676 m), weight 66.7 kg, SpO2 100 %.  Examination:  Physical Exam Constitutional:      General: He is not in acute distress.    Appearance: Normal appearance.  HENT:     Head: Normocephalic and atraumatic.     Mouth/Throat:     Mouth: Mucous membranes are moist.  Eyes:     Extraocular Movements: Extraocular movements intact.  Cardiovascular:     Rate and Rhythm: Normal rate and regular rhythm.     Heart sounds: Normal heart sounds.  Pulmonary:     Effort: Pulmonary effort is normal. No respiratory distress.     Breath sounds: Normal breath sounds. No wheezing.  Abdominal:     General: Bowel sounds are normal. There is no distension.     Palpations: Abdomen is soft.     Tenderness: There is no abdominal tenderness.  Musculoskeletal:     Cervical back: Normal range of motion and neck supple.  Comments: Left forearm wrapped in surgical dressing.  No significant edema appreciated.  Grip strength intact  Skin:    General: Skin is warm and dry.  Neurological:     General: No focal deficit present.     Mental Status: He is alert.  Psychiatric:        Mood and Affect: Mood normal.        Behavior: Behavior normal.      Consultants:  ID Orthopedic surgery  Procedures:  02/25/2022: Left wrist arthrotomy and I&D of midcarpal and radiocarpal joints  Data Reviewed: Results for orders placed or performed during the hospital encounter of 02/25/22 (from the past 24 hour(s))  Glucose, capillary      Status: None   Collection Time: 02/25/22  3:25 PM  Result Value Ref Range   Glucose-Capillary 72 70 - 99 mg/dL   Comment 1 Notify RN    Comment 2 Document in Chart    Comment 3 Call MD NNP PA CNM   Glucose, capillary     Status: Abnormal   Collection Time: 02/25/22  4:03 PM  Result Value Ref Range   Glucose-Capillary 116 (H) 70 - 99 mg/dL  Aerobic/Anaerobic Culture w Gram Stain (surgical/deep wound)     Status: None (Preliminary result)   Collection Time: 02/25/22  4:39 PM   Specimen: PATH Cytology Misc. fluid; Body Fluid  Result Value Ref Range   Specimen Description      SYNOVIAL LEFT WRIST Performed at [facility] [address], [place], [place] [address]    Special Requests      NONE Performed at [facility], [place] [address] [place], [place] [address]    Gram Stain      FEW WBC PRESENT,BOTH PMN AND MONONUCLEAR NO ORGANISMS SEEN    Culture      NO GROWTH < 12 HOURS Performed at [facility] [address], [place], [place] [address]    Report Status PENDING   Glucose, capillary     Status: Abnormal   Collection Time: 02/25/22  5:15 PM  Result Value Ref Range   Glucose-Capillary 151 (H) 70 - 99 mg/dL  Comprehensive metabolic panel     Status: Abnormal   Collection Time: 02/25/22  6:44 PM  Result Value Ref Range   Sodium 139 135 - 145 mmol/L   Potassium 3.8 3.5 - 5.1 mmol/L   Chloride 109 98 - 111 mmol/L   CO2 24 22 - 32 mmol/L   Glucose, Bld 95 70 - 99 mg/dL   BUN 15 8 - 23 mg/dL   Creatinine, Ser 1.18 0.61 - 1.24 mg/dL   Calcium 8.4 (L) 8.9 - 10.3 mg/dL   Total Protein 7.2 6.5 - 8.1 g/dL   Albumin 3.2 (L) 3.5 - 5.0 g/dL   AST 13 (L) 15 - 41 U/L   ALT 10 0 - 44 U/L   Alkaline Phosphatase 75 38 - 126 U/L   Total Bilirubin 0.5 0.3 - 1.2 mg/dL   GFR, Estimated >60 >60 mL/min   Anion gap 6 5 - 15  CBC with Differential/Platelet     Status: Abnormal   Collection Time: 02/25/22  6:44 PM  Result Value Ref Range    WBC 3.1 (L) 4.0 - 10.5 K/uL   RBC 3.51 (L) 4.22 - 5.81 MIL/uL   Hemoglobin 10.8 (L) 13.0 - 17.0 g/dL   HCT 34.3 (L) 39.0 - 52.0 %   MCV 97.7 80.0 - 100.0 fL  MCH 30.8 26.0 - 34.0 pg   MCHC 31.5 30.0 - 36.0 g/dL   RDW 15.3 11.5 - 15.5 %   Platelets 280 150 - 400 K/uL   nRBC 0.0 0.0 - 0.2 %   Neutrophils Relative % 48 %   Neutro Abs 1.5 (L) 1.7 - 7.7 K/uL   Lymphocytes Relative 25 %   Lymphs Abs 0.8 0.7 - 4.0 K/uL   Monocytes Relative 13 %   Monocytes Absolute 0.4 0.1 - 1.0 K/uL   Eosinophils Relative 13 %   Eosinophils Absolute 0.4 0.0 - 0.5 K/uL   Basophils Relative 1 %   Basophils Absolute 0.0 0.0 - 0.1 K/uL   Immature Granulocytes 0 %   Abs Immature Granulocytes 0.00 0.00 - 0.07 K/uL  Hemoglobin A1c     Status: Abnormal   Collection Time: 02/25/22  6:44 PM  Result Value Ref Range   Hgb A1c MFr Bld 7.2 (H) 4.8 - 5.6 %   Mean Plasma Glucose 159.94 mg/dL  Iron and TIBC     Status: None   Collection Time: 02/25/22  6:44 PM  Result Value Ref Range   Iron 51 45 - 182 ug/dL   TIBC 269 250 - 450 ug/dL   Saturation Ratios 19 17.9 - 39.5 %   UIBC 218 ug/dL  Glucose, capillary     Status: Abnormal   Collection Time: 02/25/22  8:35 PM  Result Value Ref Range   Glucose-Capillary 139 (H) 70 - 99 mg/dL  Glucose, capillary     Status: Abnormal   Collection Time: 02/25/22 11:35 PM  Result Value Ref Range   Glucose-Capillary 186 (H) 70 - 99 mg/dL  Comprehensive metabolic panel     Status: Abnormal   Collection Time: 02/26/22  4:45 AM  Result Value Ref Range   Sodium 138 135 - 145 mmol/L   Potassium 3.8 3.5 - 5.1 mmol/L   Chloride 109 98 - 111 mmol/L   CO2 24 22 - 32 mmol/L   Glucose, Bld 113 (H) 70 - 99 mg/dL   BUN 17 8 - 23 mg/dL   Creatinine, Ser 1.33 (H) 0.61 - 1.24 mg/dL   Calcium 8.2 (L) 8.9 - 10.3 mg/dL   Total Protein 6.3 (L) 6.5 - 8.1 g/dL   Albumin 2.6 (L) 3.5 - 5.0 g/dL   AST 13 (L) 15 - 41 U/L   ALT 9 0 - 44 U/L   Alkaline Phosphatase 66 38 - 126 U/L   Total  Bilirubin 0.3 0.3 - 1.2 mg/dL   GFR, Estimated 56 (L) >60 mL/min   Anion gap 5 5 - 15  CBC     Status: Abnormal   Collection Time: 02/26/22  4:45 AM  Result Value Ref Range   WBC 4.3 4.0 - 10.5 K/uL   RBC 3.36 (L) 4.22 - 5.81 MIL/uL   Hemoglobin 10.4 (L) 13.0 - 17.0 g/dL   HCT 33.0 (L) 39.0 - 52.0 %   MCV 98.2 80.0 - 100.0 fL   MCH 31.0 26.0 - 34.0 pg   MCHC 31.5 30.0 - 36.0 g/dL   RDW 15.3 11.5 - 15.5 %   Platelets 259 150 - 400 K/uL   nRBC 0.0 0.0 - 0.2 %  Glucose, capillary     Status: Abnormal   Collection Time: 02/26/22  7:39 AM  Result Value Ref Range   Glucose-Capillary 104 (H) 70 - 99 mg/dL  Glucose, capillary     Status: Abnormal   Collection Time: 02/26/22 11:49 AM  Result Value Ref Range   Glucose-Capillary 209 (H) 70 - 99 mg/dL    I have Reviewed nursing notes, Vitals, and Lab results since pt's last encounter. Pertinent lab results : see above I have ordered test including BMP, CBC, Mg I have reviewed the last note from staff over past 24 hours I have discussed pt's care plan and test results with nursing staff, case manager   LOS: 1 day   Dwyane Dee, MD [facility] 02/26/2022, 3:07 PM

## 2022-02-26 NOTE — H&P (Signed)
Postop day 1 status post I&D for left septic arthritis of the wrist.  The patient feels well with no fevers or chills he has no pain in the left wrist hand or digits.  Physical exam shows a clean bandage to the left wrist.  Full finger range of motion in terms of flexion and extension sensation is intact light touch and brisk cap refill to the tips of all fingers.  No pain with active or passive range of motion of the left wri st.  The patient will likely need a PICC line placed as soon as possible as he will need IV antibiotics outpatient.  Plan to follow-up the cultures and tailor the antibiotics as appropriate.  Will defer to infectious disease regarding antibiotic selection and duration.  We will follow-up in 1 week upon discharge in my office for repeat evaluation.  The bandage can remain in place as long as it is clean dry and intact if it w ere to become saturated it can be changed with a dry gauze and light wrap.  No restrictions left wrist or hand.  [name], MD [facility] HAND SURGERY [facility]

## 2022-02-26 NOTE — Hospital Course (Signed)
Max [name] is a 76 yo male with PMH DMII, HTN, psoriasis who presented with left wrist pain.  He has been previously followed by ID and orthopedic surgery for MSSA septic arthritis.  He underwent left wrist arthrotomy and I&D on 02/25/2022 with orthopedic surgery.  He was evaluated by infectious disease and initiated on Ancef to be continued at discharge via PICC line.

## 2022-02-26 NOTE — Progress Notes (Signed)
PHARMACY CONSULT NOTE FOR:  OUTPATIENT  PARENTERAL ANTIBIOTIC THERAPY (OPAT)  Indication: Septic arthritis of L wrist Regimen: Cefazolin 2g Q8h End date: 04/07/22  IV antibiotic discharge orders are pended. To discharging provider:  please sign these orders via discharge navigator,  Select New Orders & click on the button choice - Manage This Unsigned Work.     Thank you for allowing pharmacy to be a part of this patient's care.  Titus Dubin, PharmD PGY1 Pharmacy Resident 02/26/2022 9:17 AM

## 2022-02-26 NOTE — Progress Notes (Signed)
Mobility Specialist - Progress Note   02/26/22 1557  Mobility  HOB Elevated/Bed Position Self regulated  Activity Ambulated independently in hallway  Range of Motion/Exercises Active  Level of Assistance Independent  Assistive Device None  Distance Ambulated (ft) 1000 ft  Activity Response Tolerated well  $Mobility charge 1 Mobility   Pt was found in recliner chair and agreeable to ambulate. Had no complaints and at EOS returned to recliner chair with all necessities in reach.  Ferd Hibbs Mobility Specialist

## 2022-02-27 DIAGNOSIS — M00032 Staphylococcal arthritis, left wrist: Secondary | ICD-10-CM | POA: Diagnosis not present

## 2022-02-27 DIAGNOSIS — N179 Acute kidney failure, unspecified: Secondary | ICD-10-CM | POA: Diagnosis not present

## 2022-02-27 LAB — CBC WITH DIFFERENTIAL/PLATELET
Abs Immature Granulocytes: 0 10*3/uL (ref 0.00–0.07)
Basophils Absolute: 0 10*3/uL (ref 0.0–0.1)
Basophils Relative: 1 %
Eosinophils Absolute: 0.7 10*3/uL — ABNORMAL HIGH (ref 0.0–0.5)
Eosinophils Relative: 16 %
HCT: 33.3 % — ABNORMAL LOW (ref 39.0–52.0)
Hemoglobin: 10.5 g/dL — ABNORMAL LOW (ref 13.0–17.0)
Immature Granulocytes: 0 %
Lymphocytes Relative: 15 %
Lymphs Abs: 0.6 10*3/uL — ABNORMAL LOW (ref 0.7–4.0)
MCH: 30.9 pg (ref 26.0–34.0)
MCHC: 31.5 g/dL (ref 30.0–36.0)
MCV: 97.9 fL (ref 80.0–100.0)
Monocytes Absolute: 0.5 10*3/uL (ref 0.1–1.0)
Monocytes Relative: 13 %
Neutro Abs: 2.4 10*3/uL (ref 1.7–7.7)
Neutrophils Relative %: 55 %
Platelets: 268 10*3/uL (ref 150–400)
RBC: 3.4 MIL/uL — ABNORMAL LOW (ref 4.22–5.81)
RDW: 15 % (ref 11.5–15.5)
WBC: 4.2 10*3/uL (ref 4.0–10.5)
nRBC: 0 % (ref 0.0–0.2)

## 2022-02-27 LAB — BASIC METABOLIC PANEL
Anion gap: 6 (ref 5–15)
BUN: 15 mg/dL (ref 8–23)
CO2: 25 mmol/L (ref 22–32)
Calcium: 8.3 mg/dL — ABNORMAL LOW (ref 8.9–10.3)
Chloride: 110 mmol/L (ref 98–111)
Creatinine, Ser: 1.26 mg/dL — ABNORMAL HIGH (ref 0.61–1.24)
GFR, Estimated: 59 mL/min — ABNORMAL LOW (ref >60)
Glucose, Bld: 137 mg/dL — ABNORMAL HIGH (ref 70–99)
Potassium: 3.7 mmol/L (ref 3.5–5.1)
Sodium: 141 mmol/L (ref 135–145)

## 2022-02-27 LAB — GLUCOSE, CAPILLARY
Glucose-Capillary: 144 mg/dL — ABNORMAL HIGH (ref 70–99)
Glucose-Capillary: 261 mg/dL — ABNORMAL HIGH (ref 70–99)
Glucose-Capillary: 448 mg/dL — ABNORMAL HIGH (ref 70–99)
Glucose-Capillary: 466 mg/dL — ABNORMAL HIGH (ref 70–99)
Glucose-Capillary: 352 mg/dL — ABNORMAL HIGH (ref 70–99)

## 2022-02-27 LAB — HEPATITIS A ANTIBODY, TOTAL: hep A Total Ab: NONREACTIVE

## 2022-02-27 LAB — MAGNESIUM: Magnesium: 1.5 mg/dL — ABNORMAL LOW (ref 1.7–2.4)

## 2022-02-27 MED ORDER — INSULIN ASPART 100 UNIT/ML IJ SOLN
0.0000 [IU] | Freq: Every day | INTRAMUSCULAR | Status: DC
  Administered 2022-02-27: 5 [IU] via SUBCUTANEOUS

## 2022-02-27 MED ORDER — DIPHENHYDRAMINE HCL 50 MG/ML IJ SOLN
25.0000 mg | Freq: Once | INTRAMUSCULAR | Status: AC
  Administered 2022-02-27: 25 mg via INTRAVENOUS
  Filled 2022-02-27: qty 1

## 2022-02-27 MED ORDER — MAGNESIUM SULFATE 2 GM/50ML IV SOLN
2.0000 g | Freq: Once | INTRAVENOUS | Status: AC
  Administered 2022-02-27: 2 g via INTRAVENOUS
  Filled 2022-02-27: qty 50

## 2022-02-27 MED ORDER — INSULIN GLARGINE-YFGN 100 UNIT/ML ~~LOC~~ SOLN
20.0000 [IU] | Freq: Once | SUBCUTANEOUS | Status: AC
  Administered 2022-02-27: 20 [IU] via SUBCUTANEOUS
  Filled 2022-02-27: qty 0.2

## 2022-02-27 MED ORDER — FAMOTIDINE IN NACL 20-0.9 MG/50ML-% IV SOLN
20.0000 mg | Freq: Once | INTRAVENOUS | Status: AC
  Administered 2022-02-27: 20 mg via INTRAVENOUS
  Filled 2022-02-27: qty 50

## 2022-02-27 MED ORDER — METHYLPREDNISOLONE SODIUM SUCC 125 MG IJ SOLR
125.0000 mg | Freq: Once | INTRAMUSCULAR | Status: AC
  Administered 2022-02-27: 125 mg via INTRAVENOUS
  Filled 2022-02-27: qty 2

## 2022-02-27 MED ORDER — INSULIN ASPART 100 UNIT/ML IJ SOLN
0.0000 [IU] | Freq: Three times a day (TID) | INTRAMUSCULAR | Status: DC
  Administered 2022-02-27: 9 [IU] via SUBCUTANEOUS
  Administered 2022-02-28 – 2022-03-01 (×4): 2 [IU] via SUBCUTANEOUS

## 2022-02-27 NOTE — Progress Notes (Signed)
Progress Note    Max [name].   GDJ:[id number]  DOB: Aug 20, 1945  DOA: 02/25/2022     2 PCP: Binnie Rail, MD  Initial CC: Left wrist pain  Hospital Course: Max [name] is a 76 yo male with PMH DMII, HTN, psoriasis who presented with left wrist pain.  He has been previously followed by ID and orthopedic surgery for MSSA septic arthritis.  He underwent left wrist arthrotomy and I&D on 02/25/2022 with orthopedic surgery.  He was evaluated by infectious disease and initiated on Ancef to be continued at discharge via PICC line.  Interval History:  Had some itching after chlorhexidine cloths this morning. Unable to have home health infusion services arranged until Monday.  Assessment and Plan:  Left wrist septic arthritis -Previous cultures growing MSSA from 01/28/2022 -Patient initially had declined IV antibiotics during prior hospitalization -ID following.  Plan is now for Ancef via PICC line at discharge -PICC placed 02/26/2022 - discharge planned for Monday when [facility] able to be arranged   DM2 - continue SSI and CBG monitoring    HTN -Continue amlodipine   AKI -Renal ultrasound obtained on admission, left renal atrophy and cyst noted; normal right kidney - trend renal fxn  Normocytic anemia - iron levels normal    Anxiety -Continue Prozac   Old records reviewed in assessment of this patient  Antimicrobials: Ancef 02/25/2022 >> current  DVT prophylaxis:  SCDs Start: 02/25/22 1838   Code Status:   Code Status: Full Code  Mobility Assessment (last 72 hours)     Mobility Assessment     Row Name 02/27/22 0749 02/26/22 2120 02/26/22 1050 02/25/22 2005 02/25/22 1900   Does patient have an order for bedrest or is patient medically unstable No - Continue assessment No - Continue assessment No - Continue assessment No - Continue assessment No - Continue assessment   What is the highest level of mobility based on the progressive mobility assessment? Level 6 (Walks  independently in room and hall) - Balance while walking in room without assist - Complete Level 5 (Walks with assist in room/hall) - Balance while stepping forward/back and can walk in room with assist - Complete Level 6 (Walks independently in room and hall) - Balance while walking in room without assist - Complete Level 5 (Walks with assist in room/hall) - Balance while stepping forward/back and can walk in room with assist - Complete Level 6 (Walks independently in room and hall) - Balance while walking in room without assist - Complete            Barriers to discharge: None Disposition Plan: Home Monday  Status is: Inpatient  Objective: Blood pressure (!) 171/80, pulse 87, temperature 98 F (36.7 C), temperature source Oral, resp. rate 20, height '5\' 6"'$  (1.676 m), weight 66.7 kg, SpO2 91 %.  Examination:  Physical Exam Constitutional:      General: He is not in acute distress.    Appearance: Normal appearance.  HENT:     Head: Normocephalic and atraumatic.     Mouth/Throat:     Mouth: Mucous membranes are moist.  Eyes:     Extraocular Movements: Extraocular movements intact.  Cardiovascular:     Rate and Rhythm: Normal rate and regular rhythm.     Heart sounds: Normal heart sounds.  Pulmonary:     Effort: Pulmonary effort is normal. No respiratory distress.     Breath sounds: Normal breath sounds. No wheezing.  Abdominal:     General: Bowel sounds  are normal. There is no distension.     Palpations: Abdomen is soft.     Tenderness: There is no abdominal tenderness.  Musculoskeletal:     Cervical back: Normal range of motion and neck supple.     Comments: Left forearm wrapped in surgical dressing.  No significant edema appreciated.  Grip strength intact  Skin:    General: Skin is warm and dry.  Neurological:     General: No focal deficit present.     Mental Status: He is alert.  Psychiatric:        Mood and Affect: Mood normal.        Behavior: Behavior normal.       Consultants:  ID Orthopedic surgery  Procedures:  02/25/2022: Left wrist arthrotomy and I&D of midcarpal and radiocarpal joints  Data Reviewed: Results for orders placed or performed during the hospital encounter of 02/25/22 (from the past 24 hour(s))  Glucose, capillary     Status: Abnormal   Collection Time: 02/26/22  4:54 PM  Result Value Ref Range   Glucose-Capillary 150 (H) 70 - 99 mg/dL  Glucose, capillary     Status: Abnormal   Collection Time: 02/26/22  8:21 PM  Result Value Ref Range   Glucose-Capillary 215 (H) 70 - 99 mg/dL  Hepatitis A antibody, total     Status: None   Collection Time: 02/27/22  3:15 AM  Result Value Ref Range   hep A Total Ab NON REACTIVE NON REACTIVE  Basic metabolic panel     Status: Abnormal   Collection Time: 02/27/22  3:15 AM  Result Value Ref Range   Sodium 141 135 - 145 mmol/L   Potassium 3.7 3.5 - 5.1 mmol/L   Chloride 110 98 - 111 mmol/L   CO2 25 22 - 32 mmol/L   Glucose, Bld 137 (H) 70 - 99 mg/dL   BUN 15 8 - 23 mg/dL   Creatinine, Ser 1.26 (H) 0.61 - 1.24 mg/dL   Calcium 8.3 (L) 8.9 - 10.3 mg/dL   GFR, Estimated 59 (L) >60 mL/min   Anion gap 6 5 - 15  CBC with Differential/Platelet     Status: Abnormal   Collection Time: 02/27/22  3:15 AM  Result Value Ref Range   WBC 4.2 4.0 - 10.5 K/uL   RBC 3.40 (L) 4.22 - 5.81 MIL/uL   Hemoglobin 10.5 (L) 13.0 - 17.0 g/dL   HCT 33.3 (L) 39.0 - 52.0 %   MCV 97.9 80.0 - 100.0 fL   MCH 30.9 26.0 - 34.0 pg   MCHC 31.5 30.0 - 36.0 g/dL   RDW 15.0 11.5 - 15.5 %   Platelets 268 150 - 400 K/uL   nRBC 0.0 0.0 - 0.2 %   Neutrophils Relative % 55 %   Neutro Abs 2.4 1.7 - 7.7 K/uL   Lymphocytes Relative 15 %   Lymphs Abs 0.6 (L) 0.7 - 4.0 K/uL   Monocytes Relative 13 %   Monocytes Absolute 0.5 0.1 - 1.0 K/uL   Eosinophils Relative 16 %   Eosinophils Absolute 0.7 (H) 0.0 - 0.5 K/uL   Basophils Relative 1 %   Basophils Absolute 0.0 0.0 - 0.1 K/uL   Immature Granulocytes 0 %   Abs Immature  Granulocytes 0.00 0.00 - 0.07 K/uL  Magnesium     Status: Abnormal   Collection Time: 02/27/22  3:15 AM  Result Value Ref Range   Magnesium 1.5 (L) 1.7 - 2.4 mg/dL  Glucose, capillary  Status: Abnormal   Collection Time: 02/27/22  7:28 AM  Result Value Ref Range   Glucose-Capillary 144 (H) 70 - 99 mg/dL  Glucose, capillary     Status: Abnormal   Collection Time: 02/27/22 12:16 PM  Result Value Ref Range   Glucose-Capillary 261 (H) 70 - 99 mg/dL    I have Reviewed nursing notes, Vitals, and Lab results since pt's last encounter. Pertinent lab results : see above I have ordered test including BMP, CBC, Mg I have reviewed the last note from staff over past 24 hours I have discussed pt's care plan and test results with nursing staff, case manager   LOS: 2 days   Dwyane Dee, MD [facility] 02/27/2022, 3:28 PM

## 2022-02-27 NOTE — Progress Notes (Signed)
Mobility Specialist - Progress Note   02/27/22 1336  Mobility  HOB Elevated/Bed Position Self regulated  Activity Ambulated independently in hallway  Range of Motion/Exercises Active  Level of Assistance Independent  Assistive Device None  Distance Ambulated (ft) 700 ft  Activity Response Tolerated well  $Mobility charge 1 Mobility   Pt was found in recliner chair and agreeable to ambulate. Had no complaints and at EOS returned to recliner chair with all necessities in reach.  Ferd Hibbs Mobility Specialist

## 2022-02-27 NOTE — Progress Notes (Addendum)
CHG bath completed, 5 minutes after application patient complained of itching and irritation to skin worsening rash. He denies SOB or airway irritation. Erythema to bilateral arms, trunk anterior and posterior. No edema present. Provider paged with update. Will add to patients allergy list.

## 2022-02-28 DIAGNOSIS — M00032 Staphylococcal arthritis, left wrist: Secondary | ICD-10-CM | POA: Diagnosis not present

## 2022-02-28 LAB — GLUCOSE, CAPILLARY
Glucose-Capillary: 164 mg/dL — ABNORMAL HIGH (ref 70–99)
Glucose-Capillary: 151 mg/dL — ABNORMAL HIGH (ref 70–99)
Glucose-Capillary: 167 mg/dL — ABNORMAL HIGH (ref 70–99)
Glucose-Capillary: 144 mg/dL — ABNORMAL HIGH (ref 70–99)

## 2022-02-28 MED ORDER — ORAL CARE MOUTH RINSE: 15.0000 mL | OROMUCOSAL | Status: DC | PRN

## 2022-02-28 NOTE — Progress Notes (Signed)
Progress Note    Max [name].   TDD:[id number]  DOB: 21-Mar-1946  DOA: 02/25/2022     3 PCP: Max Rail, MD  Initial CC: Left wrist pain  Hospital Course: Max [name] is a 76 yo male with PMH DMII, HTN, psoriasis who presented with left wrist pain.  He has been previously followed by ID and orthopedic surgery for MSSA septic arthritis.  He underwent left wrist arthrotomy and I&D on 02/25/2022 with orthopedic surgery.  He was evaluated by infectious disease and initiated on Ancef to be continued at discharge via PICC line.  Interval History:  Had some hyperglycemia after steroid yesterday but has returned back to goal range. He is doing okay today and hoping to go home tomorrow if home infusion able to be arranged.  Assessment and Plan:  Left wrist septic arthritis -Previous cultures growing MSSA from 01/28/2022 -Patient initially had declined IV antibiotics during prior hospitalization -ID following.  Plan is now for Ancef via PICC line at discharge -PICC placed 02/26/2022 - discharge planned for Monday when [facility] able to be arranged   DM2 - continue SSI and CBG monitoring    HTN -Continue amlodipine   AKI -Renal ultrasound obtained on admission, left renal atrophy and cyst noted; normal right kidney - trend renal fxn  Normocytic anemia - iron levels normal    Anxiety -Continue Prozac   Old records reviewed in assessment of this patient  Antimicrobials: Ancef 02/25/2022 >> current  DVT prophylaxis:  SCDs Start: 02/25/22 1838   Code Status:   Code Status: Full Code  Mobility Assessment (last 72 hours)     Mobility Assessment     Row Name 02/28/22 1500 02/28/22 0849 02/27/22 0749 02/26/22 2120 02/26/22 1050   Does patient have an order for bedrest or is patient medically unstable No - Continue assessment No - Continue assessment No - Continue assessment No - Continue assessment No - Continue assessment   What is the highest level of mobility based on the  progressive mobility assessment? Level 6 (Walks independently in room and hall) - Balance while walking in room without assist - Complete Level 6 (Walks independently in room and hall) - Balance while walking in room without assist - Complete Level 6 (Walks independently in room and hall) - Balance while walking in room without assist - Complete Level 5 (Walks with assist in room/hall) - Balance while stepping forward/back and can walk in room with assist - Complete Level 6 (Walks independently in room and hall) - Balance while walking in room without assist - Complete    Row Name 02/25/22 2005 02/25/22 1900         Does patient have an order for bedrest or is patient medically unstable No - Continue assessment No - Continue assessment      What is the highest level of mobility based on the progressive mobility assessment? Level 5 (Walks with assist in room/hall) - Balance while stepping forward/back and can walk in room with assist - Complete Level 6 (Walks independently in room and hall) - Balance while walking in room without assist - Complete               Barriers to discharge: None Disposition Plan: Home Monday  Status is: Inpatient  Objective: Blood pressure 130/82, pulse 89, temperature 98.9 F (37.2 C), temperature source Oral, resp. rate 20, height '5\' 6"'$  (1.676 m), weight 66.7 kg, SpO2 98 %.  Examination:  Physical Exam Constitutional:  General: He is not in acute distress.    Appearance: Normal appearance.  HENT:     Head: Normocephalic and atraumatic.     Mouth/Throat:     Mouth: Mucous membranes are moist.  Eyes:     Extraocular Movements: Extraocular movements intact.  Cardiovascular:     Rate and Rhythm: Normal rate and regular rhythm.     Heart sounds: Normal heart sounds.  Pulmonary:     Effort: Pulmonary effort is normal. No respiratory distress.     Breath sounds: Normal breath sounds. No wheezing.  Abdominal:     General: Bowel sounds are normal. There  is no distension.     Palpations: Abdomen is soft.     Tenderness: There is no abdominal tenderness.  Musculoskeletal:     Cervical back: Normal range of motion and neck supple.     Comments: Left forearm wrapped in surgical dressing.  No significant edema appreciated.  Grip strength intact  Skin:    General: Skin is warm and dry.  Neurological:     General: No focal deficit present.     Mental Status: He is alert.  Psychiatric:        Mood and Affect: Mood normal.        Behavior: Behavior normal.      Consultants:  ID Orthopedic surgery  Procedures:  02/25/2022: Left wrist arthrotomy and I&D of midcarpal and radiocarpal joints  Data Reviewed: Results for orders placed or performed during the hospital encounter of 02/25/22 (from the past 24 hour(s))  Glucose, capillary     Status: Abnormal   Collection Time: 02/27/22  4:43 PM  Result Value Ref Range   Glucose-Capillary 448 (H) 70 - 99 mg/dL  Glucose, capillary     Status: Abnormal   Collection Time: 02/27/22  6:39 PM  Result Value Ref Range   Glucose-Capillary 466 (H) 70 - 99 mg/dL  Glucose, capillary     Status: Abnormal   Collection Time: 02/27/22  8:06 PM  Result Value Ref Range   Glucose-Capillary 352 (H) 70 - 99 mg/dL  Glucose, capillary     Status: Abnormal   Collection Time: 02/28/22  8:08 AM  Result Value Ref Range   Glucose-Capillary 164 (H) 70 - 99 mg/dL  Glucose, capillary     Status: Abnormal   Collection Time: 02/28/22 12:04 PM  Result Value Ref Range   Glucose-Capillary 151 (H) 70 - 99 mg/dL  Glucose, capillary     Status: Abnormal   Collection Time: 02/28/22  4:03 PM  Result Value Ref Range   Glucose-Capillary 167 (H) 70 - 99 mg/dL    I have Reviewed nursing notes, Vitals, and Lab results since pt's last encounter. Pertinent lab results : see above I have ordered test including BMP, CBC, Mg I have reviewed the last note from staff over past 24 hours I have discussed pt's care plan and test results  with nursing staff, case manager   LOS: 3 days   Dwyane Dee, MD [facility] 02/28/2022, 4:09 PM

## 2022-03-01 DIAGNOSIS — N179 Acute kidney failure, unspecified: Secondary | ICD-10-CM | POA: Diagnosis not present

## 2022-03-01 DIAGNOSIS — E11649 Type 2 diabetes mellitus with hypoglycemia without coma: Secondary | ICD-10-CM | POA: Diagnosis not present

## 2022-03-01 DIAGNOSIS — M00032 Staphylococcal arthritis, left wrist: Secondary | ICD-10-CM | POA: Diagnosis not present

## 2022-03-01 DIAGNOSIS — I10 Essential (primary) hypertension: Secondary | ICD-10-CM

## 2022-03-01 DIAGNOSIS — F419 Anxiety disorder, unspecified: Secondary | ICD-10-CM | POA: Diagnosis not present

## 2022-03-01 LAB — FUNGAL ORGANISM REFLEX

## 2022-03-01 LAB — FUNGUS CULTURE WITH STAIN

## 2022-03-01 LAB — GLUCOSE, CAPILLARY
Glucose-Capillary: 96 mg/dL (ref 70–99)
Glucose-Capillary: 157 mg/dL — ABNORMAL HIGH (ref 70–99)

## 2022-03-01 LAB — FUNGUS CULTURE RESULT

## 2022-03-01 MED ORDER — ACETAMINOPHEN 325 MG PO TABS: 650.0000 mg | ORAL_TABLET | ORAL | Status: DC | PRN

## 2022-03-01 NOTE — Progress Notes (Signed)
Subjective: He is anxious to go home   Antibiotics:  Anti-infectives (From admission, onward)    Start     Dose/Rate Route Frequency Ordered Stop   02/26/22 0100  ceFAZolin (ANCEF) IVPB 2g/100 mL premix        2 g 200 mL/hr over 30 Minutes Intravenous Every 8 hours 02/25/22 1812     02/26/22 0000  ceFAZolin (ANCEF) IVPB        2 g Intravenous Every 8 hours 02/26/22 1503 04/07/22 2359   02/25/22 1545  ceFAZolin (ANCEF) IVPB 2g/100 mL premix        2 g 200 mL/hr over 30 Minutes Intravenous  Once 02/25/22 1539 02/25/22 1721       Medications: Scheduled Meds:  amLODipine  10 mg Oral Daily   Chlorhexidine Gluconate Cloth  6 each Topical Daily   FLUoxetine  20 mg Oral Daily   insulin aspart  0-5 Units Subcutaneous QHS   insulin aspart  0-9 Units Subcutaneous TID WC   sodium chloride flush  10-40 mL Intracatheter Q12H   Continuous Infusions:   ceFAZolin (ANCEF) IV 2 g (03/01/22 0658)   PRN Meds:.acetaminophen **OR** acetaminophen, albuterol, mouth rinse, oxyCODONE, sodium chloride flush    Objective: Weight change:   Intake/Output Summary (Last 24 hours) at 03/01/2022 1031 Last data filed at 03/01/2022 0200 Gross per 24 hour  Intake 1120 ml  Output --  Net 1120 ml   Blood pressure 105/60, pulse 65, temperature 98.3 F (36.8 C), temperature source Oral, resp. rate 18, height _0  (1.676 m), weight 66.7 kg, SpO2 100 %. Temp:  [97.9 F (36.6 C)-98.3 F (36.8 C)] 98.3 F (36.8 C) (10/02 0628) Pulse Rate:  [62-65] 65 (10/02 0628) Resp:  [16-18] 18 (10/02 0628) BP: (105-125)/(60-69) 105/60 (10/02 0628) SpO2:  [100 %] 100 % (10/02 9597)  Physical Exam: Physical Exam Constitutional:      Appearance: He is well-developed.  HENT:     Head: Normocephalic and atraumatic.  Eyes:     Conjunctiva/sclera: Conjunctivae normal.  Cardiovascular:     Rate and Rhythm: Normal rate and regular rhythm.  Pulmonary:     Effort: Pulmonary effort is normal. No  respiratory distress.     Breath sounds: No wheezing.  Abdominal:     General: There is no distension.     Palpations: Abdomen is soft.  Musculoskeletal:     Cervical back: Normal range of motion and neck supple.  Skin:    General: Skin is warm and dry.     Findings: No erythema or rash.  Neurological:     General: No focal deficit present.     Mental Status: He is alert and oriented to person, place, and time.  Psychiatric:        Mood and Affect: Mood normal.        Behavior: Behavior normal.        Thought Content: Thought content normal.        Judgment: Judgment normal.     Right arm with cast  PICC in place  CBC:    BMET Recent Labs    02/27/22 0315  NA 141  K 3.7  CL 110  CO2 25  GLUCOSE 137*  BUN 15  CREATININE 1.26*  CALCIUM 8.3*     Liver Panel  No results for input(s): "PROT", "ALBUMIN", "AST", "ALT", "ALKPHOS", "BILITOT", "BILIDIR", "IBILI" in the last 72 hours.     Sedimentation Rate No results for  input(s): "ESRSEDRATE" in the last 72 hours. C-Reactive Protein No results for input(s): "CRP" in the last 72 hours.  Micro Results: Recent Results (from the past 720 hour(s))  Aerobic/Anaerobic Culture w Gram Stain (surgical/deep wound)     Status: None (Preliminary result)   Collection Time: 02/25/22  4:39 PM   Specimen: PATH Cytology Misc. fluid; Body Fluid  Result Value Ref Range Status   Specimen Description   Final    SYNOVIAL LEFT WRIST Performed at [facility] [address] [place], [place] [address]    Special Requests   Final    NONE Performed at [facility], [place] [address] [place], [place] [address]    Gram Stain   Final    FEW WBC PRESENT,BOTH PMN AND MONONUCLEAR NO ORGANISMS SEEN    Culture   Final    NO GROWTH 4 DAYS NO ANAEROBES ISOLATED; CULTURE IN PROGRESS FOR 5 DAYS Performed at [facility], [place] [address], [place], [place] [address]    Report Status PENDING   Incomplete    Studies/Results: No results found.    Assessment/Plan:  INTERVAL HISTORY: Cultures not yielding any organism   Principal Problem:   Septic arthritis ([place]) Active Problems:   Diabetes ([place])   Anxiety   Essential hypertension   History of renal cell carcinoma   AKI (acute kidney injury) ([place])   Normocytic anemia    Max [name]. is a 76 y.o. male with septic wrist status post surgery and oral antibiotics now requiring repeat surgery but agreeable to IV antibiotics.    Diagnosis: Septic arthritis of L wrist   Culture Result: MSSA (before)        Allergies  Allergen Reactions   Zocor [Simvastatin] Other (See Comments)      Myositis      OPAT Orders Discharge antibiotics to be given via PICC line Discharge antibiotics: Cefazolin 2g Q8h Duration: 6 weeks End Date: 04/07/22   [facility] Care Per Protocol:   Home health RN for IV administration and teaching; PICC line care and labs.     Labs weekly while on IV antibiotics: _x_ CBC with differential _x_ BMP x_ CRP x__ ESR   _x_ Please pull PIC at completion of IV antibiotics __ Please leave PIC in place until doctor has seen patient or been notified   Fax weekly labs to [phone]   Clinic Follow Up Appt:     Max [name]. has an appointment on 03/24/2022 at 11AM with Dr. Tommy Medal at:   [facility], which  is located in the [facility] at   [address] in [place].   [address], which is located to the left of the elevators.   Phone: [phone]   Fax: [phone]   [email or web address]   The patient should arrive 30 minutes prior to their appoitment.  I will followup final day of culture from OR but otherwise sign off.  Please call if further questions.   I spent 37 minutes with the patient including than 50% of the time in face to face counseling of the patient writing his staphylococcal septic  arthritis along with review of medical records in preparation for the visit and during the visit and in coordination of his care.      LOS: 4 days   Alcide Evener 03/01/2022, 10:31 AM

## 2022-03-01 NOTE — Discharge Summary (Signed)
Physician Discharge Summary   Max [name]. OVZ:[id number] DOB: 09-21-1945 DOA: 02/25/2022  PCP: Max Rail, MD  Admit date: 02/25/2022 Discharge date: 03/01/2022 Barriers to discharge: none  Admitted From: Home Disposition:  Home Discharging physician: Max Dee, MD  Recommendations for Outpatient Follow-up:  Follow up with orthopedic surgery Follow-up with infectious disease  Home Health: [facility] Equipment/Devices:   Discharge Condition: stable CODE STATUS: Full Diet recommendation:  Diet Orders (From admission, onward)     Start     Ordered   03/01/22 0000  Diet Carb Modified        03/01/22 1511   02/25/22 1755  Diet Carb Modified Fluid consistency: Thin; Room service appropriate? Yes  Diet effective now       Question Answer Comment  Diet-HS Snack? Nothing   Calorie Level Medium 1600-2000   Fluid consistency: Thin   Room service appropriate? Yes      02/25/22 1754            Hospital Course: [name] is a 76 yo male with PMH DMII, HTN, psoriasis who presented with left wrist pain.  He has been previously followed by ID and orthopedic surgery for MSSA septic arthritis.  He underwent left wrist arthrotomy and I&D on 02/25/2022 with orthopedic surgery.  He was evaluated by infectious disease and initiated on Ancef to be continued at discharge via PICC line.  Assessment and Plan:  Left wrist septic arthritis -Previous cultures growing MSSA from 01/28/2022 -Patient initially had declined IV antibiotics during prior hospitalization -ID following.  Plan is now for Ancef via PICC line at discharge -PICC placed 02/26/2022 -End date for Ancef is 04/07/2022   DM2 -Resume home regimen   HTN -Continue amlodipine   AKI -Renal ultrasound obtained on admission, left renal atrophy and cyst noted; normal right kidney -Creatinine stable and improved   Normocytic anemia - iron levels normal    Anxiety -Continue Prozac    The patient's chronic  medical conditions were treated accordingly per the patient's home medication regimen except as noted.  On day of discharge, patient was felt deemed stable for discharge. Patient/family member advised to call PCP or come back to ER if needed.   Principal Diagnosis: Septic arthritis [facility])  Discharge Diagnoses: Active Hospital Problems   Diagnosis Date Noted   Septic arthritis ([place]) 08/04/2009   Normocytic anemia 02/25/2022   AKI (acute kidney injury) ([place]) 01/28/2022   Anxiety 08/04/2009   Diabetes ([place]) 01/16/2009   Essential hypertension 01/15/2009   History of renal cell carcinoma 01/25/2005    Resolved Hospital Problems  No resolved problems to display.     Discharge Instructions     [facility] pharmacist to adjust dose for Vancomycin, Aminoglycosides and other anti-infective therapies as requested by physician.   Complete by: As directed    [facility] to provide Cath Flo 74m   Complete by: As directed    Administer for PICC line occlusion and as ordered by physician for other access device issues.   Anaphylaxis Kit: Provided to treat any anaphylactic reaction to the medication being provided to the patient if First Dose or when requested by physician   Complete by: As directed    Epinephrine 161mml vial / amp: Administer 0.29m72m0.29ml75mubcutaneously once for moderate to severe anaphylaxis, nurse to call physician and pharmacy when reaction occurs and call 911 if needed for immediate care   Diphenhydramine 50mg55mIV vial: Administer 25-50mg 77mM PRN for first dose reaction, rash,  itching, mild reaction, nurse to call physician and pharmacy when reaction occurs   Sodium Chloride 0.9% NS 510m IV: Administer if needed for hypovolemic blood pressure drop or as ordered by physician after call to physician with anaphylactic reaction   Change dressing on IV access line weekly and PRN   Complete by: As directed    Diet Carb Modified   Complete by: As directed     Flush IV access with Sodium Chloride 0.9% and Heparin 10 units/ml or 100 units/ml   Complete by: As directed    Home infusion instructions - [facility]   Complete by: As directed    Instructions: Flush IV access with Sodium Chloride 0.9% and Heparin 10units/ml or 100units/ml   Change dressing on IV access line: Weekly and PRN   Instructions Cath Flo 267m Administer for PICC Line occlusion and as ordered by physician for other access device   [facility] pharmacist to adjust dose for: Vancomycin, Aminoglycosides and other anti-infective therapies as requested by physician   Increase activity slowly   Complete by: As directed    Leave dressing on - Keep it clean, dry, and intact until clinic visit   Complete by: As directed    Method of administration may be changed at the discretion of home infusion pharmacist based upon assessment of the patient and/or caregiver's ability to self-administer the medication ordered   Complete by: As directed       Allergies as of 03/01/2022       Reactions   Chlorhexidine Itching   Skin irritation itching and worsened  rash   Zocor [simvastatin] Other (See Comments)   Myositis        Medication List     STOP taking these medications    cefadroxil 500 MG capsule Commonly known as: DURICEF       TAKE these medications    acetaminophen 325 MG tablet Commonly known as: TYLENOL Take 2 tablets (650 mg total) by mouth every 4 (four) hours as needed for mild pain or moderate pain (or Fever >/= 101).   albuterol 108 (90 Base) MCG/ACT inhaler Commonly known as: VENTOLIN HFA Inhale 2 puffs into the lungs 4 (four) times daily as needed for wheezing or shortness of breath.   amLODipine 10 MG tablet Commonly known as: NORVASC Take 1 tablet (10 mg total) by mouth daily.   aspirin EC 81 MG tablet Take 81 mg by mouth 3 (three) times a week.   ceFAZolin  IVPB Commonly known as: ANCEF Inject 2 g into the vein every 8  (eight) hours. Indication:  Septic Joint of L wrist First Dose: Yes Last Day of Therapy:  04/07/22 Labs - Once weekly:  CBC/D and BMP, Labs - Every other week:  ESR and CRP Method of administration: IV Push Pull PICC at the completion of treatment Method of administration may be changed at the discretion of home infusion pharmacist based upon assessment of the patient and/or caregiver's ability to self-administer the medication ordered.   diphenhydrAMINE 50 MG tablet Commonly known as: BENADRYL Take 50 mg by mouth daily as needed for itching.   FLUoxetine 20 MG capsule Commonly known as: PROZAC Take 20 mg by mouth daily.   insulin glargine-yfgn 100 UNIT/ML Pen Commonly known as: SEMGLEE Inject 28 Units into the skin daily.   lisinopril 10 MG tablet Commonly known as: ZESTRIL Take 1 tablet (10 mg total) by mouth daily.   metFORMIN 1000 MG tablet Commonly known as: GLUCOPHAGE Take  1,000 mg by mouth 2 (two) times daily with a meal.   triamcinolone ointment 0.1 % Commonly known as: KENALOG Apply 1 Application topically 2 (two) times daily as needed (itching).               Discharge Care Instructions  (From admission, onward)           Start     Ordered   03/01/22 0000  Leave dressing on - Keep it clean, dry, and intact until clinic visit        03/01/22 1511   02/26/22 0000  Change dressing on IV access line weekly and PRN  (Home infusion instructions - [facility] )        02/26/22 [place] Follow up.   Why: A representative with [facility] will contact you for home health RN services within the next 24 hours. Contact information: [place] [address] [phone]         [facility] Follow up.   Why: A representative with [facility] will continue to follow you for home infusion services.               Allergies  Allergen Reactions    Chlorhexidine Itching    Skin irritation itching and worsened  rash   Zocor [Simvastatin] Other (See Comments)    Myositis    Consultations: ID Orthopedic surgery  Procedures: 02/25/2022: Left wrist arthrotomy and I&D of midcarpal and radiocarpal joints  Discharge Exam: BP 133/65 (BP Location: Left Arm)   Pulse 73   Temp 98.5 F (36.9 C) (Oral)   Resp 17   Ht '5\' 6"'  (1.676 m)   Wt 66.7 kg   SpO2 100%   BMI 23.73 kg/m  Physical Exam Constitutional:      General: He is not in acute distress.    Appearance: Normal appearance.  HENT:     Head: Normocephalic and atraumatic.     Mouth/Throat:     Mouth: Mucous membranes are moist.  Eyes:     Extraocular Movements: Extraocular movements intact.  Cardiovascular:     Rate and Rhythm: Normal rate and regular rhythm.     Heart sounds: Normal heart sounds.  Pulmonary:     Effort: Pulmonary effort is normal. No respiratory distress.     Breath sounds: Normal breath sounds. No wheezing.  Abdominal:     General: Bowel sounds are normal. There is no distension.     Palpations: Abdomen is soft.     Tenderness: There is no abdominal tenderness.  Musculoskeletal:     Cervical back: Normal range of motion and neck supple.     Comments: Left forearm wrapped in surgical dressing.  No significant edema appreciated.  Grip strength intact  Skin:    General: Skin is warm and dry.  Neurological:     General: No focal deficit present.     Mental Status: He is alert.  Psychiatric:        Mood and Affect: Mood normal.        Behavior: Behavior normal.      The results of significant diagnostics from this hospitalization (including imaging, microbiology, ancillary and laboratory) are listed below for reference.   Microbiology: Recent Results (from the past 240 hour(s))  Aerobic/Anaerobic Culture w Gram Stain (surgical/deep wound)     Status: None (Preliminary result)  Collection Time: 02/25/22  4:39 PM   Specimen: PATH Cytology Misc.  fluid; Body Fluid  Result Value Ref Range Status   Specimen Description   Final    SYNOVIAL LEFT WRIST Performed at [facility] [address] [place], [place] [address]    Special Requests   Final    NONE Performed at [facility], [place] [address] [place], [place] [address]    Gram Stain   Final    FEW WBC PRESENT,BOTH PMN AND MONONUCLEAR NO ORGANISMS SEEN    Culture   Final    NO GROWTH 4 DAYS NO ANAEROBES ISOLATED; CULTURE IN PROGRESS FOR 5 DAYS Performed at [facility], [place] [address] [place], [place] [address]    Report Status PENDING  Incomplete     Labs: BNP (last 3 results) No results for input(s): "BNP" in the last 8760 hours. Basic Metabolic Panel: Recent Labs  Lab 02/25/22 1844 02/26/22 0445 02/27/22 0315  NA 139 138 141  K 3.8 3.8 3.7  CL 109 109 110  CO2 '24 24 25  ' GLUCOSE 95 113* 137*  BUN '15 17 15  ' CREATININE 1.18 1.33* 1.26*  CALCIUM 8.4* 8.2* 8.3*  MG  --   --  1.5*   Liver Function Tests: Recent Labs  Lab 02/25/22 1844 02/26/22 0445  AST 13* 13*  ALT 10 9  ALKPHOS 75 66  BILITOT 0.5 0.3  PROT 7.2 6.3*  ALBUMIN 3.2* 2.6*   No results for input(s): "LIPASE", "AMYLASE" in the last 168 hours. No results for input(s): "AMMONIA" in the last 168 hours. CBC: Recent Labs  Lab 02/25/22 1844 02/26/22 0445 02/27/22 0315  WBC 3.1* 4.3 4.2  NEUTROABS 1.5*  --  2.4  HGB 10.8* 10.4* 10.5*  HCT 34.3* 33.0* 33.3*  MCV 97.7 98.2 97.9  PLT 280 259 268   Cardiac Enzymes: No results for input(s): "CKTOTAL", "CKMB", "CKMBINDEX", "TROPONINI" in the last 168 hours. BNP: Invalid input(s): "POCBNP" CBG: Recent Labs  Lab 02/28/22 1204 02/28/22 1603 02/28/22 2140 03/01/22 0742 03/01/22 1204  GLUCAP 151* 167* 144* 96 157*   D-Dimer No results for input(s): "DDIMER" in the last 72 hours. Hgb A1c No results for input(s): "HGBA1C" in the last 72 hours. Lipid Profile No results for input(s): "CHOL",  "HDL", "LDLCALC", "TRIG", "CHOLHDL", "LDLDIRECT" in the last 72 hours. Thyroid function studies No results for input(s): "TSH", "T4TOTAL", "T3FREE", "THYROIDAB" in the last 72 hours.  Invalid input(s): "FREET3" Anemia work up No results for input(s): "VITAMINB12", "FOLATE", "FERRITIN", "TIBC", "IRON", "RETICCTPCT" in the last 72 hours. Urinalysis    Component Value Date/Time   LABSPEC 1.030 06/10/2009 1443   PHURINE 5.0 06/10/2009 1443   HGBUR Negative 06/10/2009 1443   BILIRUBINUR Negative 06/10/2009 1443   KETONESUR Negative 06/10/2009 1443   PROTEINUR < 30 06/10/2009 1443   NITRITE Negative 06/10/2009 1443   LEUKOCYTESUR Negative 06/10/2009 1443   Sepsis Labs Recent Labs  Lab 02/25/22 1844 02/26/22 0445 02/27/22 0315  WBC 3.1* 4.3 4.2   Microbiology Recent Results (from the past 240 hour(s))  Aerobic/Anaerobic Culture w Gram Stain (surgical/deep wound)     Status: None (Preliminary result)   Collection Time: 02/25/22  4:39 PM   Specimen: PATH Cytology Misc. fluid; Body Fluid  Result Value Ref Range Status   Specimen Description   Final    SYNOVIAL LEFT WRIST Performed at [facility] [address], [place],  [address]    Special Requests  Final    NONE Performed at [facility], [place] [address], [place], [place] [address]    Gram Stain   Final    FEW WBC PRESENT,BOTH PMN AND MONONUCLEAR NO ORGANISMS SEEN    Culture   Final    NO GROWTH 4 DAYS NO ANAEROBES ISOLATED; CULTURE IN PROGRESS FOR 5 DAYS Performed at [facility], [place] [address], [place],  [address]    Report Status PENDING  Incomplete    Procedures/Studies: [place] EKG SITE RITE  Result Date: 02/26/2022 If Site Rite image not attached, placement could not be confirmed due to current cardiac rhythm.  US RENAL  Result Date: 02/25/2022 CLINICAL DATA:  AKI. EXAM: RENAL / URINARY TRACT ULTRASOUND COMPLETE COMPARISON:  02/23/2016. FINDINGS: Right  Kidney: Renal measurements: 10.2 x 4.4 x 4.5 cm = volume: 106.3 mL. Echogenicity within normal limits. No mass or hydronephrosis visualized. Left Kidney: Renal measurements: 8.2 x 4.1 x 4.2 cm = volume: 73.6 mL. Echogenicity within normal limits. There is a cyst in the lower pole measuring 2.2 x 1.8 x 1.8 cm. No mass or hydronephrosis visualized. Bladder: Appears normal for degree of bladder distention. Bilateral ureteral jets are noted. Other: Limited evaluation due to bowel gas. IMPRESSION: 1. Left renal atrophy and cyst. 2. Normal evaluation of the right kidney and urinary bladder. Electronically Signed   By: Brett Fairy M.D.   On: 02/25/2022 20:18     Time coordinating discharge: Over 30 minutes    Max Dee, MD  [facility] 03/01/2022, 4:57 PM

## 2022-03-01 NOTE — TOC Progression Note (Addendum)
Transition of Care (TOC) - Progression Note    Patient Details  Name: Max [name]. MRN: [id number] Date of Birth: [birth year]  Transition of Care [facility]) CM/SW Contact  Roseanne Kaufman, RN Phone Number: 03/01/2022, 9:54 AM  Clinical Narrative:   [facility] Info[facility]ule Ser, PCP: Dr. Sherral Hammers, VASW: Mickle [place]  TOC will continue to follow.  - 10:40am spoke with [facility] SW Kathline to advise of patient's status. Spoke with Jeannene Patella with [facility] who is following for home IV abx, OPAT form was faxed on 02/26/22, awaiting [facility] to review for authorization.  TOC will continue to follow. - 12:47p Patient has been approved for [place] IV abx with [facility] and [place] with [facility]. Next dose of IV Cefazolin is scheduled for 2p.   TOC will continue to follow.  -2:36p Patient's HHRN with [facility] will start on tomorrow 03/02/22. Pam with [facility] has spoke with patient regarding home infusion antibiotic services. Notified RN, MD.  Expected Discharge Plan: [place] Barriers to Discharge: Continued Medical Work up  Expected Discharge Plan and Services Expected Discharge Plan: [place] In-house Referral: NA Discharge Planning Services: CM Consult Post Acute Care Choice: [place] arrangements for the past 2 months: Single Family Home                 DME Arranged: N/A DME Agency: NA       HH Arranged: RN [place] Agency: [place] ([place]) Date [place]: 02/26/22 Time [place]: [phone] Representative spoke with at [place]: [place] ([place]) Interventions    Readmission Risk Interventions     No data to display

## 2022-03-01 NOTE — Progress Notes (Signed)
Mobility Specialist - Progress Note   03/01/22 1056  Mobility  Activity Ambulated independently in hallway  Activity Response Tolerated well  Distance Ambulated (ft) 1050 ft  $Mobility charge 1 Mobility  Level of Assistance Independent  Assistive Device None  HOB Elevated/Bed Position Self regulated  Range of Motion/Exercises Active  Transport method Ambulatory  Mobility Referral No   Pt received in bed and agreeable to mobility. No complaints during ambulation. Pt to bed after session with all needs met.    [facility]

## 2022-03-01 NOTE — Progress Notes (Signed)
Pharmacy Antibiotic Note  Max [name]. is a 76 y.o. male admitted on 02/25/2022 with left wrist septic arthritis who is s/p I&D on 9/28.  Pharmacy was consulted for cefazolin dosing by ID for osteomyelitis. OPAT done with end date 04/07/2022.  Today, 03/01/22 CrCl ~40 mL/min using SCr 1.26 from labs on 02/27/22  Plan: Continue Cefazolin 2 g IV q8h through OPAT end date of 04/07/2022 Further dosing adjustments not anticipated.  Pharmacy will sign off consult and continue to monitor clinical progress & renal function.  Please re-consult if a change in clinical status warrants re-evaluation of dosage.  Height: '5\' 6"'$  (167.6 cm) Weight: 66.7 kg (147 lb 0.8 oz) IBW/kg (Calculated) : 63.8  Temp (24hrs), Avg:98.1 F (36.7 C), Min:97.9 F (36.6 C), Max:98.3 F (36.8 C)  Recent Labs  Lab 02/25/22 1844 02/26/22 0445 02/27/22 0315  WBC 3.1* 4.3 4.2  CREATININE 1.18 1.33* 1.26*    Estimated Creatinine Clearance: 45.7 mL/min (A) (by C-G formula based on SCr of 1.26 mg/dL (H)).    Allergies  Allergen Reactions   Chlorhexidine Itching    Skin irritation itching and worsened  rash   Zocor [Simvastatin] Other (See Comments)    Myositis    Antimicrobials this admission: cefazolin 9/28 >>   Microbiology results: 9/28 body fluid/wound: NGTD   Thank you for allowing pharmacy to be a part of this patient's care.  Royetta Asal, PharmD, BCPS Clinical Pharmacist [place] Please utilize [facility] for appropriate phone number to reach the unit pharmacist ([place]) 03/01/2022 10:20 AM

## 2022-03-01 NOTE — Progress Notes (Signed)
Mobility Specialist - Progress Note   03/01/22 1411  Mobility  Activity Ambulated independently in hallway  Activity Response Tolerated well  Distance Ambulated (ft) 1050 ft  $Mobility charge 1 Mobility  Level of Assistance Independent  Assistive Device None  HOB Elevated/Bed Position Self regulated  Range of Motion/Exercises Active  Transport method Ambulatory  Mobility Referral No   Pt received in bed and agreeable to mobility. No complaints during ambulation. Pt to bed after session with all needs met.     [facility]

## 2022-03-02 LAB — AEROBIC/ANAEROBIC CULTURE W GRAM STAIN (SURGICAL/DEEP WOUND): Culture: NO GROWTH

## 2022-03-05 ENCOUNTER — Encounter: Payer: Self-pay | Admitting: *Deleted

## 2022-03-05 ENCOUNTER — Telehealth: Payer: Self-pay | Admitting: *Deleted

## 2022-03-05 NOTE — Patient Outreach (Signed)
  Care Coordination [facility] Note Transition Care Management Follow-up Telephone Call Date of discharge and from where: Monday, March 01, 2022 [facility]; (L) wrist arthrotomy with I/D How have you been since you were released from the hospital? "I am doing just fine; My son is helping me with giving the antibiotics, and it isn't hard to do.  There is a nurse coming out every week to check the PICC line, she'll be back on Monday.  I am back to doing everything myself and I don't have any concerns at all.... I am even able to drive myself anywhere I need to go" Any questions or concerns? No  Items Reviewed: Did the pt receive and understand the discharge instructions provided? Yes  Medications obtained and verified? Yes  Other? No  Any new allergies since your discharge? No  Dietary orders reviewed? Yes Do you have support at home? Yes  patient lives alone but son is assisting as needed/ indicated and managing IV antibiotics  Home Care and Equipment/Supplies: Were home health services ordered? yes If so, what is the name of the agency? [facility]  Has the agency set up a time to come to the patient's home? Yes- verified currently active Were any new equipment or medical supplies ordered?  Yes: IV equipment for IV antibiotic administration What is the name of the medical supply agency? [facility] Were you able to get the supplies/equipment? yes Do you have any questions related to the use of the equipment or supplies? No  Functional Questionnaire: (I = Independent and D = Dependent) ADLs: I  Bathing/Dressing- I  Meal Prep- I  Eating- I  Maintaining continence- I  Transferring/Ambulation- I  Managing Meds- I  son is assisting as needed/ indicated and managing IV antibiotics  Follow up appointments reviewed:  PCP Hospital f/u appt confirmed? No  Scheduled to see - on - @ [facility] f/u appt confirmed? Yes  Scheduled to see surgical provider on Monday 03/08/22 at 9:30 am and ID  provider on Wednesday 03/24/22 @ 11:00 Are transportation arrangements needed? No  If their condition worsens, is the pt aware to call PCP or go to the Emergency Dept.? Yes Was the patient provided with contact information for the PCP's office or ED? Yes Was to pt encouraged to call back with questions or concerns? Yes  SDOH assessments and interventions completed:   Yes  Care Coordination Interventions Activated:  Yes   Care Coordination Interventions:  Reviewed basic infection prevention practices with PICC line; encouraged patient to obtain flu vaccine promptly post-hospital discharge- he states he will obtain from [place] provider "soon"    Encounter Outcome:  Pt. Visit Completed    Oneta Rack, RN, BSN, CCRN Alumnus RN CM Care Coordination/ Transition of [place] Management [phone]: direct office

## 2022-03-08 ENCOUNTER — Emergency Department (HOSPITAL_COMMUNITY)
Admission: EM | Admit: 2022-03-08 | Discharge: 2022-03-08 | Disposition: A | Discharge status: Home / Self Care | Payer: No Typology Code available for payment source | Attending: Emergency Medicine | Admitting: Emergency Medicine

## 2022-03-08 ENCOUNTER — Encounter (HOSPITAL_COMMUNITY): Payer: Self-pay

## 2022-03-08 DIAGNOSIS — Z79899 Other long term (current) drug therapy: Secondary | ICD-10-CM | POA: Diagnosis not present

## 2022-03-08 DIAGNOSIS — E11649 Type 2 diabetes mellitus with hypoglycemia without coma: Secondary | ICD-10-CM | POA: Insufficient documentation

## 2022-03-08 DIAGNOSIS — I10 Essential (primary) hypertension: Secondary | ICD-10-CM | POA: Diagnosis not present

## 2022-03-08 DIAGNOSIS — E162 Hypoglycemia, unspecified: Secondary | ICD-10-CM | POA: Diagnosis present

## 2022-03-08 DIAGNOSIS — Z794 Long term (current) use of insulin: Secondary | ICD-10-CM | POA: Diagnosis not present

## 2022-03-08 DIAGNOSIS — Z7982 Long term (current) use of aspirin: Secondary | ICD-10-CM | POA: Diagnosis not present

## 2022-03-08 DIAGNOSIS — Z792 Long term (current) use of antibiotics: Secondary | ICD-10-CM | POA: Diagnosis not present

## 2022-03-08 LAB — CBC WITH DIFFERENTIAL/PLATELET
Abs Immature Granulocytes: 0.01 10*3/uL (ref 0.00–0.07)
Basophils Absolute: 0 10*3/uL (ref 0.0–0.1)
Basophils Relative: 1 %
Eosinophils Absolute: 0.5 10*3/uL (ref 0.0–0.5)
Eosinophils Relative: 9 %
HCT: 30.8 % — ABNORMAL LOW (ref 39.0–52.0)
Hemoglobin: 10.3 g/dL — ABNORMAL LOW (ref 13.0–17.0)
Immature Granulocytes: 0 %
Lymphocytes Relative: 12 %
Lymphs Abs: 0.7 10*3/uL (ref 0.7–4.0)
MCH: 32.3 pg (ref 26.0–34.0)
MCHC: 33.4 g/dL (ref 30.0–36.0)
MCV: 96.6 fL (ref 80.0–100.0)
Monocytes Absolute: 0.6 10*3/uL (ref 0.1–1.0)
Monocytes Relative: 11 %
Neutro Abs: 4.1 10*3/uL (ref 1.7–7.7)
Neutrophils Relative %: 67 %
Platelets: 289 10*3/uL (ref 150–400)
RBC: 3.19 MIL/uL — ABNORMAL LOW (ref 4.22–5.81)
RDW: 14.7 % (ref 11.5–15.5)
WBC: 6 10*3/uL (ref 4.0–10.5)
nRBC: 0 % (ref 0.0–0.2)

## 2022-03-08 LAB — CBG MONITORING, ED
Glucose-Capillary: 124 mg/dL — ABNORMAL HIGH (ref 70–99)
Glucose-Capillary: 190 mg/dL — ABNORMAL HIGH (ref 70–99)

## 2022-03-08 LAB — ETHANOL: Alcohol, Ethyl (B): <10 mg/dL (ref <10)

## 2022-03-08 LAB — RAPID URINE DRUG SCREEN, HOSP PERFORMED
Amphetamines: NOT DETECTED
Barbiturates: NOT DETECTED
Benzodiazepines: NOT DETECTED
Cocaine: NOT DETECTED
Opiates: NOT DETECTED
Tetrahydrocannabinol: NOT DETECTED

## 2022-03-08 LAB — BASIC METABOLIC PANEL
Anion gap: 5 (ref 5–15)
BUN: 15 mg/dL (ref 8–23)
CO2: 25 mmol/L (ref 22–32)
Calcium: 8.1 mg/dL — ABNORMAL LOW (ref 8.9–10.3)
Chloride: 104 mmol/L (ref 98–111)
Creatinine, Ser: 1.42 mg/dL — ABNORMAL HIGH (ref 0.61–1.24)
GFR, Estimated: 52 mL/min — ABNORMAL LOW (ref >60)
Glucose, Bld: 125 mg/dL — ABNORMAL HIGH (ref 70–99)
Potassium: 3.7 mmol/L (ref 3.5–5.1)
Sodium: 134 mmol/L — ABNORMAL LOW (ref 135–145)

## 2022-03-08 LAB — SEDIMENTATION RATE: Sed Rate: 60 mm/hr — ABNORMAL HIGH (ref 0–16)
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
## 2022-03-08 NOTE — ED Notes (Signed)
Pt was given a diet ginger ale and [place] sandwich

## 2022-03-08 NOTE — ED Notes (Signed)
Pt OK to eat per MD- pt provided with a dinner tray

## 2022-03-10 ENCOUNTER — Telehealth: Payer: Self-pay | Admitting: *Deleted

## 2022-03-10 ENCOUNTER — Encounter: Payer: Self-pay | Admitting: *Deleted

## 2022-03-10 ENCOUNTER — Ambulatory Visit: Payer: Self-pay | Admitting: *Deleted
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
## 2022-03-10 NOTE — Patient Outreach (Signed)
  Care Coordination   Multidisciplinary Case Review Note    03/10/2022 Name: Max [name]. MRN: [id number] DOB: 11-Jul-1945  Max [name]. is a 76 y.o. year old male who sees Burns, Claudina Lick, MD for primary care.  The multidisciplinary care team met today to review patient care needs and barriers. 30 day readmission for septic arthritis with recent ED visit 03/08/22    SDOH assessments and interventions completed:  No Previous assessment of SDOH completed with TOC call 03/05/22   Care Coordination Interventions Activated:  Yes   Care Coordination Interventions:  Yes, provided   Follow up plan:  RN CM [facility] Care Coordinator will attempt post- TOC assessment today, after recent ED visit 03/08/22 for hypoglycemia; will make referral and schedule with RN Care Coordinator accordingly if successful outreach is made with patient    Multidisciplinary Team Attendees:   Reginia Naas, RN CM TOC Care Coordinator; [name], RN CM Care Coordinator; Neldon Labella, RN CM Care Coordinator; Casimer Lanius, LCSW  Scribe for Multidisciplinary Case Review:  N/A  Oneta Rack, RN, BSN, CCRN Alumnus RN CM Care Coordination/ Transition of [place] Management [phone]: direct office
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
## 2022-03-19 LAB — ACID FAST CULTURE WITH REFLEXED SENSITIVITIES (MYCOBACTERIA): Acid Fast Culture: NEGATIVE

## 2022-03-22 DIAGNOSIS — Z792 Long term (current) use of antibiotics: Secondary | ICD-10-CM | POA: Diagnosis not present

## 2022-03-24 ENCOUNTER — Other Ambulatory Visit: Payer: Self-pay

## 2022-03-24 ENCOUNTER — Encounter: Payer: Self-pay | Admitting: *Deleted

## 2022-03-24 ENCOUNTER — Encounter: Payer: Self-pay | Admitting: Infectious Disease

## 2022-03-24 ENCOUNTER — Telehealth: Payer: Self-pay | Admitting: *Deleted

## 2022-03-24 ENCOUNTER — Ambulatory Visit (INDEPENDENT_AMBULATORY_CARE_PROVIDER_SITE_OTHER): Payer: No Typology Code available for payment source | Admitting: Infectious Disease

## 2022-03-24 VITALS — BP 159/72 | HR 66 | Temp 97.8°F | Resp 16 | Ht 66.0 in | Wt 153.0 lb

## 2022-03-24 DIAGNOSIS — A4101 Sepsis due to Methicillin susceptible Staphylococcus aureus: Secondary | ICD-10-CM

## 2022-03-24 DIAGNOSIS — E11649 Type 2 diabetes mellitus with hypoglycemia without coma: Secondary | ICD-10-CM | POA: Insufficient documentation

## 2022-03-24 DIAGNOSIS — M00032 Staphylococcal arthritis, left wrist: Secondary | ICD-10-CM

## 2022-03-24 HISTORY — DX: Type 2 diabetes mellitus with hypoglycemia without coma: E11.649
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
## 2022-03-25 ENCOUNTER — Ambulatory Visit: Payer: Self-pay

## 2022-03-25 NOTE — Patient Outreach (Signed)
  Care Coordination   Initial Visit Note   03/25/2022 Name: Max [name]. MRN: [id number] DOB: 01-Sep-1945  Max [name]. is a 76 y.o. year old male who sees Burns, Claudina Lick, MD for primary care. I spoke with  Max [name]. by phone today.  What matters to the patients health and wellness today?  Patient called to request to reschedule telephone assessment, stating his home health nurse is on the way to check his PICC line and assist with IV administration concerns. The nurse arrived during telephone call. Max [name] reports otherwise he is doing well.     Goals Addressed             This Visit's Progress    Care Coordination Activities: Follow up scheduled for 03/25/22       Care Coordination Interventions: Reviewed scheduled/upcoming provider appointments including: upcoming appointments with the [facility] and ID Confirmed patient has been in contact with [facility]: Home health nurse. Per patient home health nurse arrived to his home during this telephone encounter. Encouraged patient to write down home health company name, contact number and nurses name and have it in a nearby location to be able to call as needed Confirmed patient has transportation to upcoming appointments Care Coordination collaboration with Transition of Care Nurse        SDOH assessments and interventions completed:  Yes  SDOH Interventions Today    Flowsheet Row Most Recent Value  SDOH Interventions   Transportation Interventions Intervention Not Indicated     Care Coordination Interventions Activated:  Yes  Care Coordination Interventions:  Yes, provided   Follow up plan: Follow up call scheduled for 04/01/22    Encounter Outcome:  Pt. Visit Completed   [name], RN, MSN, BSN, [place] Coordinator [phone]

## 2022-03-25 NOTE — Patient Instructions (Signed)
Visit Information  Thank you for taking time to visit with me today. Please don't hesitate to contact me if I can be of assistance to you.   Following are the goals we discussed today:   Goals Addressed             This Visit's Progress    Care Coordination Activities: Follow up scheduled for 03/25/22       Care Coordination Interventions: Reviewed scheduled/upcoming provider appointments including: upcoming appointments with the [facility] and ID Confirmed patient has been in contact with [facility]: Home health nurse. Per patient home health nurse arrived to his home during this telephone encounter. Encouraged patient to write down home health company name, contact number and nurses name and have it in a nearby location to be able to call as needed Confirmed patient has transportation to upcoming appointments Care Coordination collaboration with Transition of Care Nurse        Our next appointment is by telephone on 04/01/22 at 11:30 am  Please call the care guide team at [phone] if you need to cancel or reschedule your appointment.   If you are experiencing a Mental Health or [place] or need someone to talk to, please call the Suicide and Crisis Lifeline: 988  The patient verbalized understanding of instructions, educational materials, and care plan provided today and DECLINED offer to receive copy of patient instructions, educational materials, and care plan.   [name], RN, MSN, BSN, [place] Coordinator [phone]

## 2022-03-29 DIAGNOSIS — Z792 Long term (current) use of antibiotics: Secondary | ICD-10-CM | POA: Diagnosis not present

## 2022-04-01 ENCOUNTER — Telehealth: Payer: Self-pay

## 2022-04-01 NOTE — Patient Outreach (Signed)
  Care Coordination   04/01/2022 Name: Max [name]. MRN: [id number] DOB: 09-Jan-1946   Care Coordination Outreach Attempts:  An unsuccessful telephone outreach was attempted today to offer the patient information about available care coordination services as a benefit of their health plan.   Follow Up Plan:  Additional outreach attempts will be made to offer the patient care coordination information and services.   Encounter Outcome:  No Answer  Care Coordination Interventions Activated:  No   Care Coordination Interventions:  No, not indicated    [name], RN, MSN, BSN, [place] Coordinator [phone]

## 2022-04-05 DIAGNOSIS — Z792 Long term (current) use of antibiotics: Secondary | ICD-10-CM | POA: Diagnosis not present

## 2022-04-07 ENCOUNTER — Telehealth: Payer: Self-pay

## 2022-04-07 ENCOUNTER — Telehealth: Payer: Self-pay | Admitting: *Deleted

## 2022-04-07 NOTE — Progress Notes (Signed)
  Care Coordination Note  04/07/2022 Name: Max [name]. MRN: [id number] DOB: 1945/11/18  [name]. is a 76 y.o. year old male who is a primary care patient of Quay Burow, Claudina Lick, MD and is actively engaged with the care management team. I reached out to Jacqulyn Cane. by phone today to assist with re-scheduling a follow up visit with the RN Case Manager  Follow up plan: Unsuccessful telephone outreach attempt made. A HIPAA compliant phone message was left for the patient providing contact information and requesting a return call.   Julian Hy, [place] Direct Dial: [phone]

## 2022-04-07 NOTE — Telephone Encounter (Signed)
Particia Nearing with [facility] called to ask if she can PULL PICC on Monday for this patient. Gave verbal order per Dr Tommy Medal that it is ok to pull picc Monday. Notified Print production planner.

## 2022-04-26 ENCOUNTER — Ambulatory Visit (INDEPENDENT_AMBULATORY_CARE_PROVIDER_SITE_OTHER): Payer: Medicare Other | Admitting: Infectious Disease

## 2022-04-26 ENCOUNTER — Other Ambulatory Visit: Payer: Self-pay

## 2022-04-26 ENCOUNTER — Encounter: Payer: Self-pay | Admitting: Infectious Disease

## 2022-04-26 VITALS — BP 185/84 | HR 69 | Temp 97.3°F | Ht 66.0 in | Wt 148.0 lb

## 2022-04-26 DIAGNOSIS — M00032 Staphylococcal arthritis, left wrist: Secondary | ICD-10-CM | POA: Diagnosis not present

## 2022-04-26 DIAGNOSIS — I10 Essential (primary) hypertension: Secondary | ICD-10-CM

## 2022-04-26 DIAGNOSIS — E11649 Type 2 diabetes mellitus with hypoglycemia without coma: Secondary | ICD-10-CM

## 2022-04-26 DIAGNOSIS — A4101 Sepsis due to Methicillin susceptible Staphylococcus aureus: Secondary | ICD-10-CM

## 2022-04-26 DIAGNOSIS — Z7185 Encounter for immunization safety counseling: Secondary | ICD-10-CM | POA: Diagnosis not present
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
## 2022-04-27 LAB — CBC WITH DIFFERENTIAL/PLATELET
Absolute Monocytes: 404 cells/uL (ref 200–950)
Basophils Absolute: 39 cells/uL (ref 0–200)
Basophils Relative: 0.9 %
Eosinophils Absolute: 383 cells/uL (ref 15–500)
Eosinophils Relative: 8.9 %
HCT: 34 % — ABNORMAL LOW (ref 38.5–50.0)
Hemoglobin: 11.4 g/dL — ABNORMAL LOW (ref 13.2–17.1)
Lymphs Abs: 645 cells/uL — ABNORMAL LOW (ref 850–3900)
MCH: 30.6 pg (ref 27.0–33.0)
MCHC: 33.5 g/dL (ref 32.0–36.0)
MCV: 91.4 fL (ref 80.0–100.0)
MPV: 10.3 fL (ref 7.5–12.5)
Monocytes Relative: 9.4 %
Neutro Abs: 2829 cells/uL (ref 1500–7800)
Neutrophils Relative %: 65.8 %
Platelets: 286 10*3/uL (ref 140–400)
RBC: 3.72 10*6/uL — ABNORMAL LOW (ref 4.20–5.80)
RDW: 13.9 % (ref 11.0–15.0)
Total Lymphocyte: 15 %
WBC: 4.3 10*3/uL (ref 3.8–10.8)

## 2022-04-27 LAB — BASIC METABOLIC PANEL WITH GFR
BUN: 22 mg/dL (ref 7–25)
CO2: 27 mmol/L (ref 20–32)
Calcium: 9.5 mg/dL (ref 8.6–10.3)
Chloride: 107 mmol/L (ref 98–110)
Creat: 1.21 mg/dL (ref 0.70–1.28)
Glucose, Bld: 97 mg/dL (ref 65–99)
Potassium: 4.3 mmol/L (ref 3.5–5.3)
Sodium: 143 mmol/L (ref 135–146)
eGFR: 62 mL/min/{1.73_m2} (ref >=60)

## 2022-04-27 LAB — C-REACTIVE PROTEIN: CRP: 1.6 mg/L (ref <8.0)

## 2022-04-27 LAB — SEDIMENTATION RATE: Sed Rate: 33 mm/h — ABNORMAL HIGH (ref 0–20)

## 2022-05-20 NOTE — Progress Notes (Signed)
  Care Coordination Note  05/20/2022 Name: Max [name]. MRN: [id number] DOB: 01/28/1946  [name]. is a 76 y.o. year old male who is a primary care patient of Quay Burow, Claudina Lick, MD and is actively engaged with the care management team. I reached out to Jacqulyn Cane. by phone today to assist with re-scheduling a follow up visit with the RN Case Manager  Follow up plan: Telephone appointment with care management team member scheduled for: 06/03/2022  Julian Hy, [place] Direct Dial: [phone]

## 2022-06-03 ENCOUNTER — Ambulatory Visit: Payer: Self-pay

## 2022-06-03 NOTE — Patient Instructions (Addendum)
Visit Information  Thank you for taking time to visit with me today. Please don't hesitate to contact me if I can be of assistance to you.   Following are the goals we discussed today:   Goals Addressed             This Visit's Progress    Care Coordination Activities       Care Coordination Interventions: Discussed blood sugar readings. Encouraged patient to begin checking daily or per provider recommendation and record using the date vs day of the week. Discussed importance of checking blood pressure will send education on checking blood pressure. Encouraged to begin to check BP 2-3 x/week and record by the date. Provided education via mail: [place]; How to keep track of your heart rate and Blood pressure; checking your blood pressure at home. Encouraged to schedule follow up appointment with primary care provider        Our next appointment is by telephone on 07/05/22 at 10:00 am  Please call the care guide team at [phone] if you need to cancel or reschedule your appointment.   If you are experiencing a Mental Health or [place] or need someone to talk to, please call the Suicide and Crisis Lifeline: [place], RN, MSN, BSN, [place] [phone]

## 2022-06-03 NOTE — Patient Outreach (Signed)
  Care Coordination   Follow Up Visit Note   06/03/2022 Name: Max [name]. MRN: [id number] DOB: [birth year]  Max [name]. is a 77 y.o. year old male who sees Burns, Claudina Lick, MD for primary care. I spoke with  Darivs Lynden Ang. by phone today.  What matters to the patients health and wellness today?  Patient reports he is doing better. He is no longer on antibiotics and states he has been released by orthopedic provider. RNCM noted blood pressure elevated (185/84) at last office visit with infectious disease on 04/26/22. He reports he was just nervous and that his blood pressure is better. However when asked for the BP reading patient only provides a one number answer. In addition he is recording by the day of the week vs the actual date of the reading.  Upon further discussion, patient recognized he was telling RNCM the blood sugar readings last BS reading 128 and 129. He acknowledges that it is not checked daily. He states he has not checked his blood pressure "in a while".   Goals Addressed             This Visit's Progress    Care Coordination Activities       Care Coordination Interventions: Discussed blood sugar readings. Encouraged patient to begin checking daily or per provider recommendation and record using the date vs day of the week. Discussed importance of checking blood pressure will send education on checking blood pressure. Encouraged to begin to check BP 2-3 x/week and record by the date. Provided education via mail: [place]; How to keep track of your heart rate and Blood pressure; checking your blood pressure at home. Encouraged to schedule follow up appointment with primary care provider        SDOH assessments and interventions completed:  No  Care Coordination Interventions:  Yes, provided   Follow up plan: Follow up call scheduled for 07/06/22    Encounter Outcome:  Pt. Visit Completed  [name], RN, MSN, BSN, [place]  Coordinator [phone]
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
## 2022-06-07 NOTE — Patient Instructions (Addendum)
      Blood work was ordered.   The lab is on the first floor.    Medications changes include :       A referral was ordered for XXX.     Someone will call you to schedule an appointment.    Return in about 6 months (around 12/07/2022) for follow up.

## 2022-06-08 ENCOUNTER — Ambulatory Visit (INDEPENDENT_AMBULATORY_CARE_PROVIDER_SITE_OTHER): Payer: Non-veteran care | Admitting: Internal Medicine

## 2022-06-08 ENCOUNTER — Encounter: Payer: Self-pay | Admitting: Internal Medicine

## 2022-06-08 VITALS — BP 200/90 | HR 68 | Temp 98.2°F | Ht 66.0 in | Wt 154.0 lb

## 2022-06-08 DIAGNOSIS — E782 Mixed hyperlipidemia: Secondary | ICD-10-CM | POA: Diagnosis not present

## 2022-06-08 DIAGNOSIS — F419 Anxiety disorder, unspecified: Secondary | ICD-10-CM

## 2022-06-08 DIAGNOSIS — Z794 Long term (current) use of insulin: Secondary | ICD-10-CM

## 2022-06-08 DIAGNOSIS — I10 Essential (primary) hypertension: Secondary | ICD-10-CM

## 2022-06-08 DIAGNOSIS — E11649 Type 2 diabetes mellitus with hypoglycemia without coma: Secondary | ICD-10-CM | POA: Diagnosis not present

## 2022-06-08 NOTE — Assessment & Plan Note (Addendum)
Chronic   Lab Results  Component Value Date   HGBA1C 7.2 (H) 02/25/2022   Sugars not ideally controlled Check A1c, urine microalbumin today Continue insulin glargine 28 units daily, metformin 1000 mg twice daily Sugars [facility] Stressed regular exercise, diabetic diet

## 2022-06-08 NOTE — Assessment & Plan Note (Addendum)
Chronic Regular exercise and healthy diet encouraged Check lipid panel  History of statin induced myositis

## 2022-06-08 NOTE — Assessment & Plan Note (Addendum)
Chronic Blood pressure not controlled-the [facility] is currently adjusting medications and trying to get his blood pressure better controlled so I will not make any changes today.  He does have an upcoming appointment at the [facility] Continue amlodipine 10 mg daily, lisinopril 10 mg daily for lisinopril-hydrochlorothiazide 10-12.5 mg daily-he is not sure which

## 2022-06-08 NOTE — Assessment & Plan Note (Signed)
Chronic Controlled, Stable Continue fluoxetine 20 mg daily 

## 2022-06-09 ENCOUNTER — Telehealth: Payer: Self-pay | Admitting: Internal Medicine
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
## 2022-06-09 NOTE — Telephone Encounter (Signed)
Message left for patient today that sticker placed up front for pickup.

## 2022-06-16 ENCOUNTER — Encounter: Payer: Self-pay | Admitting: Infectious Disease

## 2022-06-16 ENCOUNTER — Other Ambulatory Visit: Payer: Self-pay

## 2022-06-16 ENCOUNTER — Ambulatory Visit (INDEPENDENT_AMBULATORY_CARE_PROVIDER_SITE_OTHER): Payer: Medicare Other | Admitting: Infectious Disease

## 2022-06-16 VITALS — BP 174/84 | HR 67 | Temp 98.4°F | Ht 66.0 in | Wt 153.0 lb

## 2022-06-16 DIAGNOSIS — I10 Essential (primary) hypertension: Secondary | ICD-10-CM | POA: Diagnosis not present

## 2022-06-16 DIAGNOSIS — A4101 Sepsis due to Methicillin susceptible Staphylococcus aureus: Secondary | ICD-10-CM

## 2022-06-16 DIAGNOSIS — Z7185 Encounter for immunization safety counseling: Secondary | ICD-10-CM | POA: Diagnosis not present

## 2022-06-16 DIAGNOSIS — M00032 Staphylococcal arthritis, left wrist: Secondary | ICD-10-CM
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
## 2022-06-17 LAB — BASIC METABOLIC PANEL WITH GFR
BUN: 17 mg/dL (ref 7–25)
CO2: 26 mmol/L (ref 20–32)
Calcium: 9.7 mg/dL (ref 8.6–10.3)
Chloride: 102 mmol/L (ref 98–110)
Creat: 1.24 mg/dL (ref 0.70–1.28)
Glucose, Bld: 163 mg/dL — ABNORMAL HIGH (ref 65–99)
Potassium: 4.1 mmol/L (ref 3.5–5.3)
Sodium: 139 mmol/L (ref 135–146)
eGFR: 61 mL/min/{1.73_m2} (ref >=60)

## 2022-06-17 LAB — SEDIMENTATION RATE: Sed Rate: 28 mm/h — ABNORMAL HIGH (ref 0–20)

## 2022-06-17 LAB — CBC WITH DIFFERENTIAL/PLATELET
Absolute Monocytes: 418 cells/uL (ref 200–950)
Basophils Absolute: 40 cells/uL (ref 0–200)
Basophils Relative: 0.9 %
Eosinophils Absolute: 198 cells/uL (ref 15–500)
Eosinophils Relative: 4.5 %
HCT: 41.4 % (ref 38.5–50.0)
Hemoglobin: 13.9 g/dL (ref 13.2–17.1)
Lymphs Abs: 691 cells/uL — ABNORMAL LOW (ref 850–3900)
MCH: 31.4 pg (ref 27.0–33.0)
MCHC: 33.6 g/dL (ref 32.0–36.0)
MCV: 93.7 fL (ref 80.0–100.0)
MPV: 10.1 fL (ref 7.5–12.5)
Monocytes Relative: 9.5 %
Neutro Abs: 3054 cells/uL (ref 1500–7800)
Neutrophils Relative %: 69.4 %
Platelets: 299 10*3/uL (ref 140–400)
RBC: 4.42 10*6/uL (ref 4.20–5.80)
RDW: 13.8 % (ref 11.0–15.0)
Total Lymphocyte: 15.7 %
WBC: 4.4 10*3/uL (ref 3.8–10.8)

## 2022-06-17 LAB — C-REACTIVE PROTEIN: CRP: 1.3 mg/L (ref <8.0)

## 2022-07-03 ENCOUNTER — Encounter (HOSPITAL_COMMUNITY): Payer: Self-pay | Admitting: Emergency Medicine

## 2022-07-03 ENCOUNTER — Other Ambulatory Visit: Payer: Self-pay

## 2022-07-03 ENCOUNTER — Emergency Department (HOSPITAL_COMMUNITY)
Admission: EM | Admit: 2022-07-03 | Discharge: 2022-07-03 | Disposition: A | Discharge status: Home / Self Care | Payer: No Typology Code available for payment source | Attending: Emergency Medicine | Admitting: Emergency Medicine

## 2022-07-03 DIAGNOSIS — T7840XA Allergy, unspecified, initial encounter: Secondary | ICD-10-CM | POA: Diagnosis not present

## 2022-07-03 DIAGNOSIS — R220 Localized swelling, mass and lump, head: Secondary | ICD-10-CM | POA: Diagnosis present

## 2022-07-03 DIAGNOSIS — Z7982 Long term (current) use of aspirin: Secondary | ICD-10-CM | POA: Insufficient documentation

## 2022-07-03 DIAGNOSIS — Z794 Long term (current) use of insulin: Secondary | ICD-10-CM | POA: Insufficient documentation

## 2022-07-03 MED ORDER — DIPHENHYDRAMINE HCL 25 MG PO CAPS
50.0000 mg | ORAL_CAPSULE | Freq: Once | ORAL | Status: AC
  Administered 2022-07-03: 50 mg via ORAL
  Filled 2022-07-03: qty 2

## 2022-07-03 NOTE — ED Provider Triage Note (Signed)
Emergency Medicine Provider Triage Evaluation Note  Max [name]. , a 77 y.o. male  was evaluated in triage.  Pt complains of bilateral eye swelling. Used new cream at [employer]. No respiratory complaints.  Review of Systems  Positive: Eye edema Negative: SOB  Physical Exam  BP (!) 163/92 (BP Location: Right Arm)   Pulse 82   Temp 98.9 F (37.2 C) (Oral)   Resp 18   SpO2 100%  Gen:   Awake, no distress   Resp:  Normal effort  MSK:   Moves extremities without difficulty  Other:  Edema to bilateral eyes. Lungs clear to auscultation bilaterally  Medical Decision Making  Medically screening exam initiated at 3:42 PM.  Appropriate orders placed.  Max [name]. was informed that the remainder of the evaluation will be completed by another provider, this initial triage assessment does not replace that evaluation, and the importance of remaining in the ED until their evaluation is complete.  Allergic reaction. No evidence of airway compromise   Karie Kirks 07/03/22 2146

## 2022-07-03 NOTE — ED Triage Notes (Signed)
Pt reports bilateral eye lid swelling that started this morning. Pt stating they used a different cream at the [facility] shop yesterday.

## 2022-07-03 NOTE — Discharge Instructions (Addendum)
Your symptoms are consistent with an allergic reaction to the new cream that was applied to your face. Recommend you take antihistamines at home, swelling should improve over time. You were not in anaphylaxis today, however, guidance regarding anaphylaxis is attached.

## 2022-07-03 NOTE — ED Provider Notes (Signed)
  [facility] Provider Note   CSN: [id number] Arrival date & time: 07/03/22  1516     History  Chief Complaint  Patient presents with  . Facial Swelling    Max [name]. is a 77 y.o. male.  HPI     Home Medications Prior to Admission medications   Medication Sig Start Date End Date Taking? Authorizing Provider  albuterol (VENTOLIN HFA) 108 (90 Base) MCG/ACT inhaler Inhale 2 puffs into the lungs 4 (four) times daily as needed for wheezing or shortness of breath. 04/14/21   [provider]  amLODipine (NORVASC) 10 MG tablet Take 1 tablet (10 mg total) by mouth daily. 02/02/12   Rowe Clack, MD  aspirin EC 81 MG tablet Take 81 mg by mouth 3 (three) times a week. 11/18/09   [provider]  FLUoxetine (PROZAC) 20 MG capsule Take 20 mg by mouth daily. 04/29/21   [provider]  insulin glargine-yfgn (SEMGLEE) 100 UNIT/ML Pen Inject 28 Units into the skin daily. 08/13/21   [provider]  lisinopril (ZESTRIL) 10 MG tablet Take 1 tablet (10 mg total) by mouth daily. Patient not taking: Reported on 06/16/2022 06/10/21   Binnie Rail, MD  lisinopril-hydrochlorothiazide (ZESTORETIC) 10-12.5 MG tablet TAKE 1 TABLET BY MOUTH DAILY FOR BLOOD PRESSURE AND KIDNEY PROTECTION 01/25/22   [provider]  metFORMIN (GLUCOPHAGE) 1000 MG tablet Take 1,000 mg by mouth 2 (two) times daily with a meal.  12/09/10   Biagio Borg, MD  triamcinolone ointment (KENALOG) 0.1 % Apply 1 Application topically 2 (two) times daily as needed (itching). Patient not taking: Reported on 06/16/2022    [provider]      Allergies    Zocor [simvastatin] and Chlorhexidine    Review of Systems   Review of Systems  Physical Exam Updated Vital Signs BP (!) 145/67   Pulse 66   Temp 98.9 F (37.2 C) (Oral)   Resp 20   SpO2 100%  Physical Exam  ED Results / Procedures / Treatments   Labs (all labs ordered  are listed, but only abnormal results are displayed) Labs Reviewed - No data to display  EKG None  Radiology No results found.  Procedures Procedures  {Document cardiac monitor, telemetry assessment procedure when appropriate:1}  Medications Ordered in ED Medications - No data to display  ED Course/ Medical Decision Making/ A&P   {   Click here for ABCD2, HEART and other calculatorsREFRESH Note before signing :1}                          Medical Decision Making  ***  {Document critical care time when appropriate:1} {Document review of labs and clinical decision tools ie heart score, Chads2Vasc2 etc:1}  {Document your independent review of radiology images, and any outside records:1} {Document your discussion with family members, caretakers, and with consultants:1} {Document social determinants of health affecting pt's care:1} {Document your decision making why or why not admission, treatments were needed:1} Final Clinical Impression(s) / ED Diagnoses Final diagnoses:  None    Rx / DC Orders ED Discharge Orders     None

## 2022-07-04 ENCOUNTER — Other Ambulatory Visit: Payer: Self-pay

## 2022-07-04 ENCOUNTER — Emergency Department (HOSPITAL_COMMUNITY)
Admission: EM | Admit: 2022-07-04 | Discharge: 2022-07-04 | Disposition: A | Discharge status: Home / Self Care | Payer: No Typology Code available for payment source | Attending: Emergency Medicine | Admitting: Emergency Medicine

## 2022-07-04 DIAGNOSIS — E119 Type 2 diabetes mellitus without complications: Secondary | ICD-10-CM | POA: Insufficient documentation

## 2022-07-04 DIAGNOSIS — Z85528 Personal history of other malignant neoplasm of kidney: Secondary | ICD-10-CM | POA: Diagnosis not present

## 2022-07-04 DIAGNOSIS — R220 Localized swelling, mass and lump, head: Secondary | ICD-10-CM | POA: Diagnosis present

## 2022-07-04 DIAGNOSIS — T7840XD Allergy, unspecified, subsequent encounter: Secondary | ICD-10-CM

## 2022-07-04 DIAGNOSIS — T7840XA Allergy, unspecified, initial encounter: Secondary | ICD-10-CM | POA: Diagnosis not present

## 2022-07-04 DIAGNOSIS — I10 Essential (primary) hypertension: Secondary | ICD-10-CM | POA: Diagnosis not present

## 2022-07-04 MED ORDER — METHYLPREDNISOLONE 4 MG PO TBPK: ORAL_TABLET | ORAL | 0 refills | Status: DC

## 2022-07-04 MED ORDER — DIPHENHYDRAMINE HCL 25 MG PO CAPS
25.0000 mg | ORAL_CAPSULE | Freq: Once | ORAL | Status: AC
  Administered 2022-07-04: 25 mg via ORAL
  Filled 2022-07-04: qty 1

## 2022-07-04 MED ORDER — PREDNISONE 20 MG PO TABS
60.0000 mg | ORAL_TABLET | Freq: Once | ORAL | Status: AC
  Administered 2022-07-04: 60 mg via ORAL
  Filled 2022-07-04: qty 3

## 2022-07-04 NOTE — ED Provider Notes (Signed)
[facility] Provider Note   CSN: [id number] Arrival date & time: 07/04/22  9629     History  Chief Complaint  Patient presents with   Facial Swelling    Doug Dwon Sky. is a 77 y.o. male with history of eosinophilia, dermatitis, HTN, HLD, diabetes, renal cell cancer s/p partial nephrectomy who presents to the ER for subsequent visit regarding facial swelling. Was seen last night for same symptoms, given benadryl and discharged. Thought to be allergic reaction secondary to use of new cream at the barber shop. He had swelling of bilateral eyelids with no airway compromise. He presents today complaining of worsening swelling.    HPI     Home Medications Prior to Admission medications   Medication Sig Start Date End Date Taking? Authorizing Provider  methylPREDNISolone (MEDROL DOSEPAK) 4 MG TBPK tablet Take per package instructions 07/04/22  Yes Dandrae Kustra T, PA-C  albuterol (VENTOLIN HFA) 108 (90 Base) MCG/ACT inhaler Inhale 2 puffs into the lungs 4 (four) times daily as needed for wheezing or shortness of breath. 04/14/21   [provider]  amLODipine (NORVASC) 10 MG tablet Take 1 tablet (10 mg total) by mouth daily. 02/02/12   Rowe Clack, MD  aspirin EC 81 MG tablet Take 81 mg by mouth 3 (three) times a week. 11/18/09   [provider]  FLUoxetine (PROZAC) 20 MG capsule Take 20 mg by mouth daily. 04/29/21   [provider]  insulin glargine-yfgn (SEMGLEE) 100 UNIT/ML Pen Inject 28 Units into the skin daily. 08/13/21   [provider]  lisinopril (ZESTRIL) 10 MG tablet Take 1 tablet (10 mg total) by mouth daily. Patient not taking: Reported on 06/16/2022 06/10/21   Binnie Rail, MD  lisinopril-hydrochlorothiazide (ZESTORETIC) 10-12.5 MG tablet TAKE 1 TABLET BY MOUTH DAILY FOR BLOOD PRESSURE AND KIDNEY PROTECTION 01/25/22   [provider]  metFORMIN (GLUCOPHAGE) 1000 MG tablet Take 1,000  mg by mouth 2 (two) times daily with a meal.  12/09/10   Biagio Borg, MD  triamcinolone ointment (KENALOG) 0.1 % Apply 1 Application topically 2 (two) times daily as needed (itching). Patient not taking: Reported on 06/16/2022    [provider]      Allergies    Zocor [simvastatin] and Chlorhexidine    Review of Systems   Review of Systems  HENT:  Positive for facial swelling.   All other systems reviewed and are negative.   Physical Exam Updated Vital Signs BP (!) 172/72   Pulse 84   Temp 98.5 F (36.9 C) (Oral)   Resp 18   SpO2 100%  Physical Exam Vitals and nursing note reviewed.  Constitutional:      Appearance: Normal appearance.  HENT:     Head: Normocephalic and atraumatic.  Eyes:     Extraocular Movements: Extraocular movements intact.     Conjunctiva/sclera: Conjunctivae normal.     Comments: Bilateral upper and lower eyelid swelling and mild erythema. Scant white discharge from right eye. No tenderness to palpation, no crepitus.  Cardiovascular:     Rate and Rhythm: Normal rate and regular rhythm.  Pulmonary:     Effort: Pulmonary effort is normal. No respiratory distress.     Breath sounds: Normal breath sounds.  Skin:    General: Skin is warm and dry.  Neurological:     General: No focal deficit present.     Mental Status: He is alert.     ED Results /  Procedures / Treatments   Labs (all labs ordered are listed, but only abnormal results are displayed) Labs Reviewed - No data to display  EKG None  Radiology No results found.  Procedures Procedures    Medications Ordered in ED Medications  diphenhydrAMINE (BENADRYL) capsule 25 mg (25 mg Oral Given 07/04/22 0736)  predniSONE (DELTASONE) tablet 60 mg (60 mg Oral Given 07/04/22 0736)    ED Course/ Medical Decision Making/ A&P                             Medical Decision Making This patient is a 77 y.o. male who presents to the ED for concern of facial swelling, seen yesterday for  allergic reaction.   Differential diagnoses prior to evaluation: Ongoing allergic reaction, conjunctivitis, uveitis  Past Medical History / Social History / Additional history: Chart reviewed. Pertinent results include: eosinophilia, dermatitis, HTN, HLD, diabetes, renal cell cancer s/p partial nephrectomy  Seen yesterday in ER for same symptoms, no airway compromise. Given benadryl with symptom improvement, discharged home. Did not take any additional medications once home.   Physical Exam: Physical exam performed. The pertinent findings include: Mildly hypertensive, otherwise normal vital signs. No increased respiratory effort, lung sounds normal. No angioedema. Bilateral upper and lower eyelid swelling without tenderness or crepitus. Normal conjunctiva and pupils. EOMI without pain.   Medications / Treatment: Given additional benadryl and prednisone   Disposition: After consideration of the diagnostic results and the patients response to treatment, I feel that emergency department workup does not suggest an emergent condition requiring admission or immediate intervention beyond what has been performed at this time. The plan is: discharge to home with medrol dose pack and recommend continued antihistamines. Suspect ongoing allergic reaction, no evidence of anaphylaxis. The patient is safe for discharge and has been instructed to return immediately for worsening symptoms, change in symptoms or any other concerns.  Final Clinical Impression(s) / ED Diagnoses Final diagnoses:  Allergic reaction, subsequent encounter  Facial swelling    Rx / DC Orders ED Discharge Orders          Ordered    methylPREDNISolone (MEDROL DOSEPAK) 4 MG TBPK tablet        07/04/22 0720           Portions of this report may have been transcribed using voice recognition software. Every effort was made to ensure accuracy; however, inadvertent computerized transcription errors may be present.     Estill Cotta 07/04/22 0755    Fatima Blank, MD 07/04/22 [phone]

## 2022-07-04 NOTE — Discharge Instructions (Addendum)
You were seen in the ER for ongoing allergic reaction.  We have given you additional benadryl and steroid medicine. I'm prescribing you some steroids you can take at home as well. Make sure to pick up some benadryl from the pharmacy as you can continue taking this as needed.  You can use cool compresses to help with some swelling. Take ibuprofen and/or tylenol as needed for pain or discomfort.   Continue to monitor how you're doing and return to the ER for new or worsening symptoms such as change in your vision, loss of vision, fevers, difficulty breathing or swallowing.

## 2022-07-04 NOTE — ED Triage Notes (Signed)
Pt states face is still swelling and getting worse. Was here yesterday for same thing.

## 2022-07-05 ENCOUNTER — Ambulatory Visit: Payer: Self-pay

## 2022-07-05 NOTE — Patient Outreach (Signed)
  Care Coordination   Follow Up Visit Note   07/05/2022 Name: Max [name]. MRN: [id number] DOB: [birth year]  Max [name]. is a 77 y.o. year old male who sees [facility] for primary care. I spoke with  Max [name]. by phone today.  What matters to the patients health and wellness today?  Max [name] states he has a nurse visiting him a the time of the call. He request for the care guide to call to rescheduled.   Goals Addressed             This Visit's Progress    Care Coordination Activities       Care Coordination Interventions: Per patient request, Referral to care guide to reschedule [facility] telephone visit.         SDOH assessments and interventions completed:  No  Care Coordination Interventions:  Yes, provided   Follow up plan:  per patient request referral to Care Guide to reschedule telephone visit    Encounter Outcome:  Pt. Visit Completed   [name], RN, MSN, BSN, [place] Coordinator [phone]

## 2022-07-08 ENCOUNTER — Telehealth: Payer: Self-pay | Admitting: *Deleted

## 2022-07-08 NOTE — Progress Notes (Signed)
  Care Coordination Note  07/08/2022 Name: Max [name]. MRN: [id number] DOB: [birth year]  Max [name]. is a 77 y.o. year old male who is a primary care patient of [facility] and is actively engaged with the care management team. I reached out to [employer]. by phone today to assist with re-scheduling a follow up visit with the RN Case Manager  Follow up plan: Unsuccessful telephone outreach attempt made. A HIPAA compliant phone message was left for the patient providing contact information and requesting a return call.   Julian Hy, [place] Direct Dial: [phone]

## 2022-07-12 NOTE — Progress Notes (Signed)
  Care Coordination Note  07/12/2022 Name: Max [name]. MRN: [id number] DOB: 1946/04/23  Max [name]. is a 77 y.o. year old male who is a primary care patient of [facility] and is actively engaged with the care management team. I reached out to Jacqulyn Cane. by phone today to assist with re-scheduling a follow up visit with the RN Case Manager  Follow up plan: Telephone appointment with care management team member scheduled for: 07/16/2022  Julian Hy, [place] Direct Dial: [phone]

## 2022-07-13 IMAGING — CR DG RIBS W/ CHEST 3+V*L*
5 series · 5 of 5 positions shown · non-contrast
Comparison: March 06, 2012

CLINICAL DATA: Fall.  Patient fell 2 days ago.  Flank pain.

EXAM:
LEFT RIBS AND CHEST - 3+ VIEW

[w chest pa (1 of 2)]
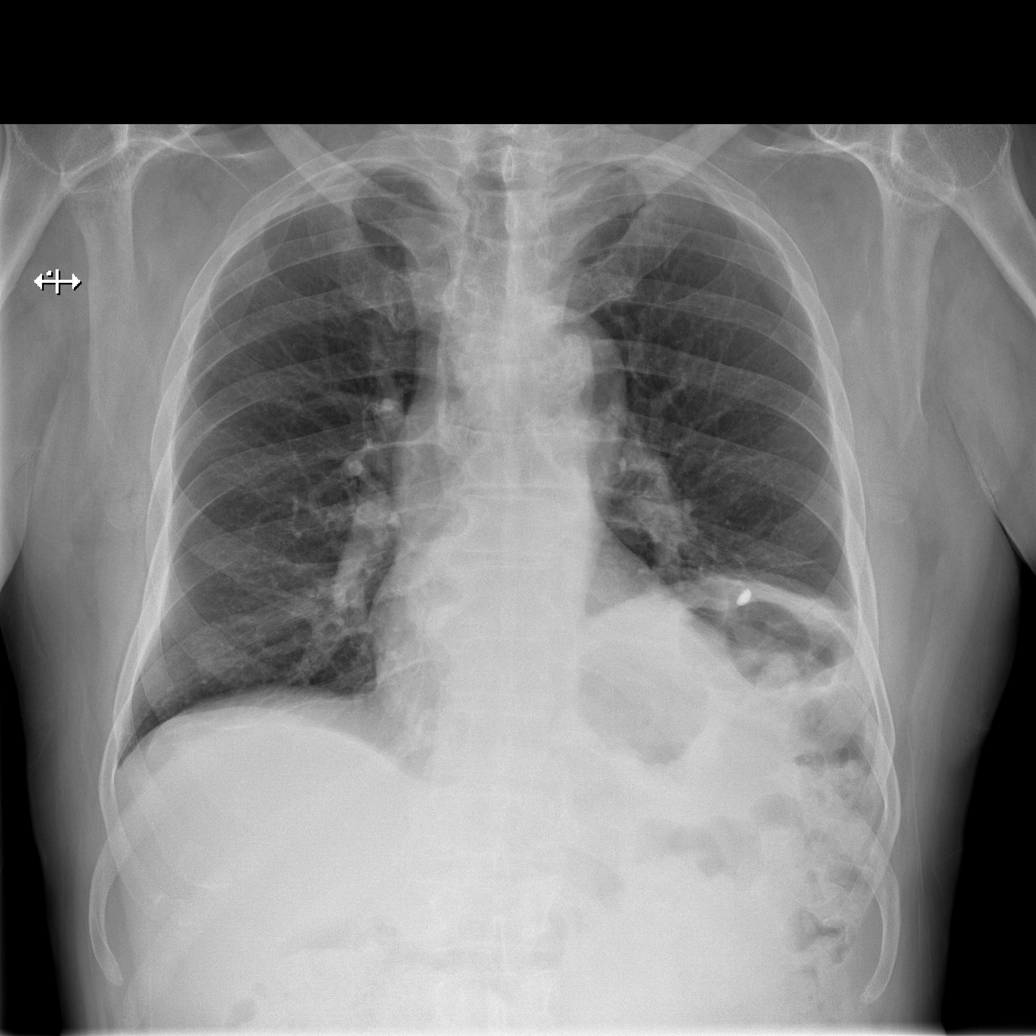

[w chest pa (2 of 2)]
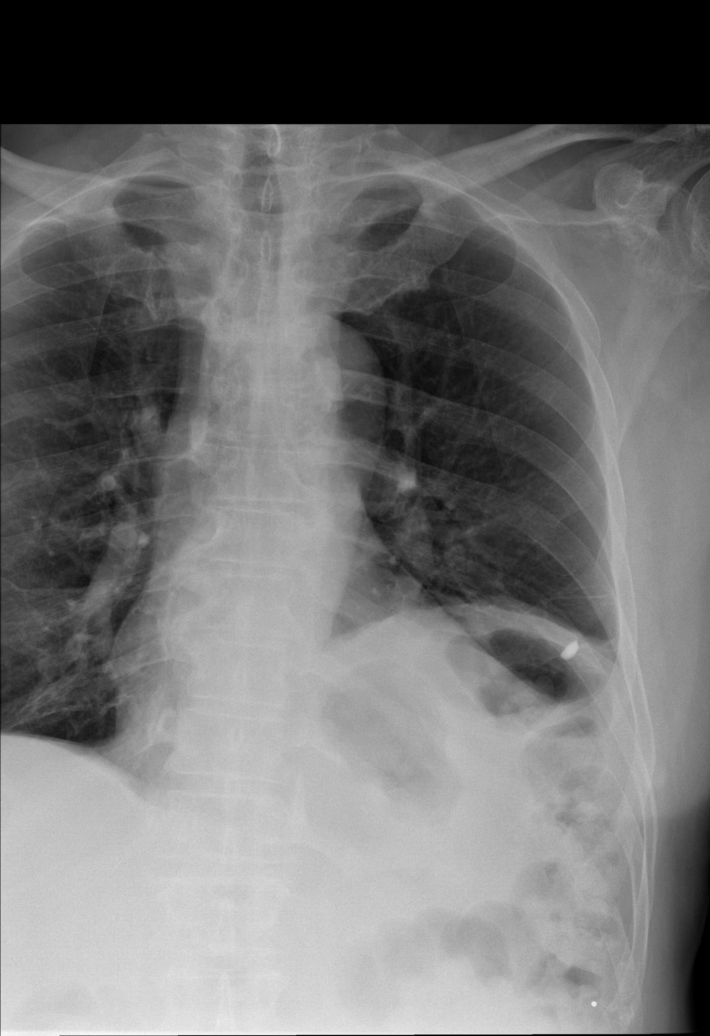

[w ribs ap lower left]
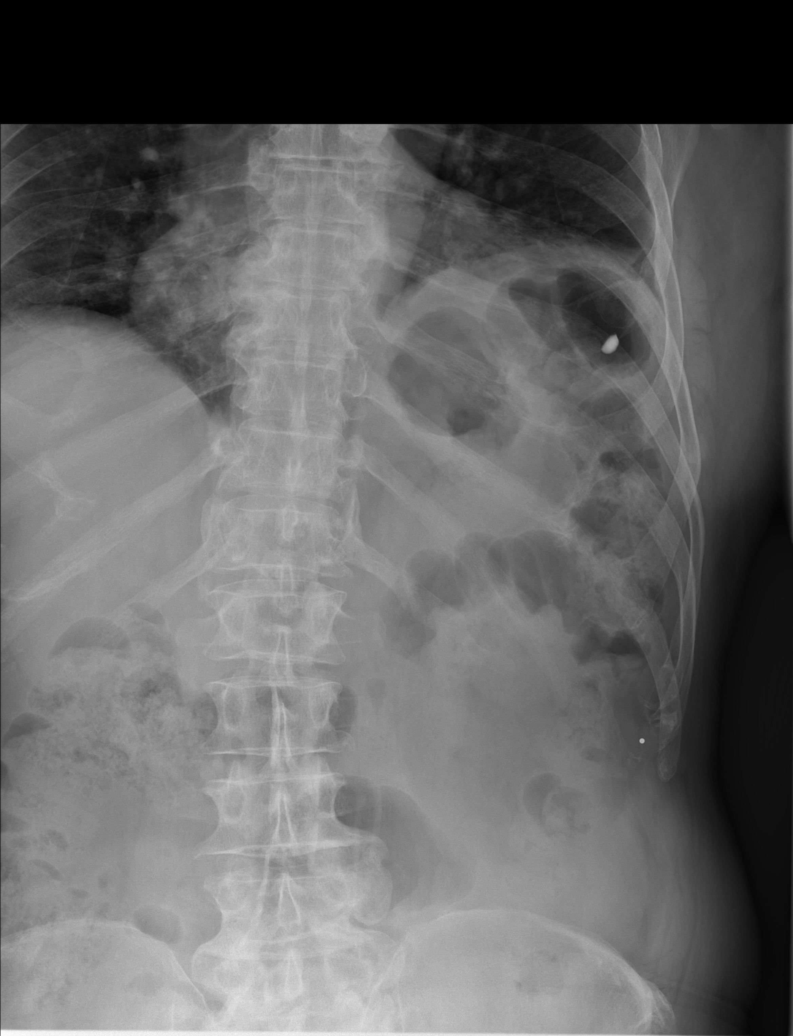

[w ribs obl left (1 of 2)]
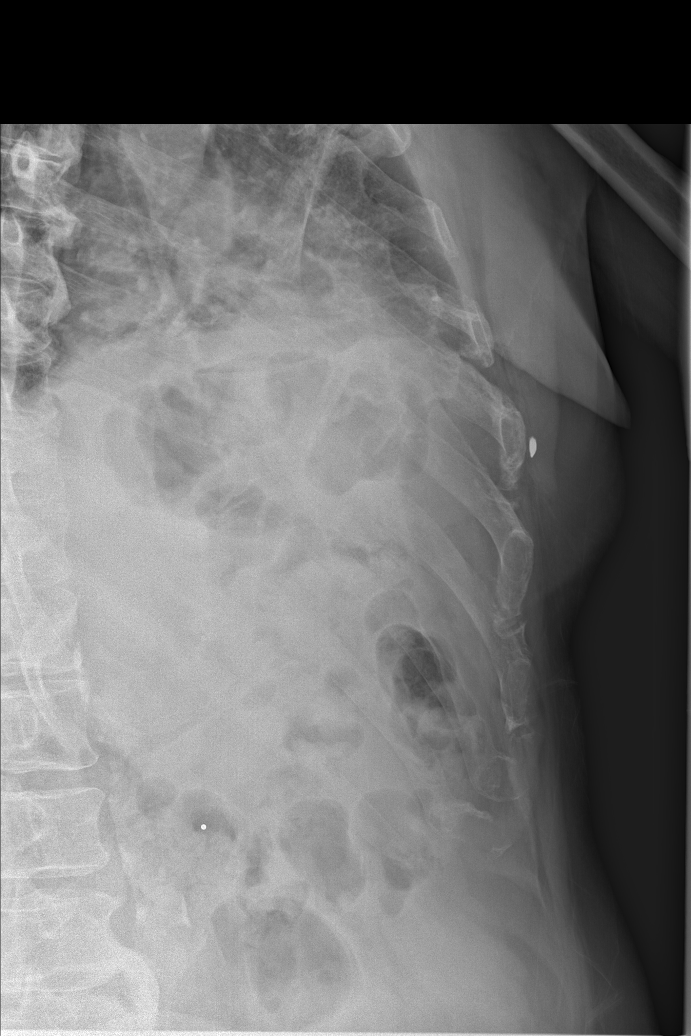

[w ribs obl left (2 of 2)]
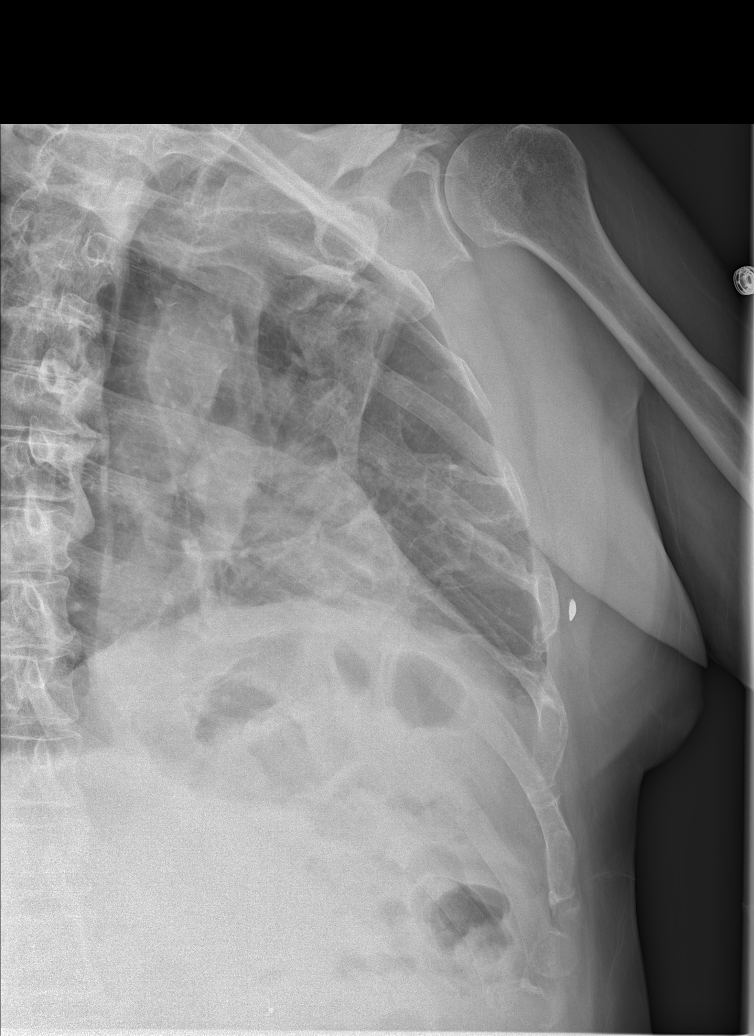

[5 of 5 positions shown; findings below may reference images not displayed]

FINDINGS: The heart, hila, and mediastinum are normal. The left hemidiaphragm
remains elevated but stable. Mild atelectasis in the left base. No
pneumothorax. The lungs are otherwise clear. No definite rib
fractures identified.
IMPRESSION: No rib fractures noted.  No pneumothorax.

## 2022-07-16 ENCOUNTER — Ambulatory Visit: Payer: Self-pay

## 2022-07-16 NOTE — Patient Instructions (Signed)
Visit Information  Thank you for taking time to visit with me today. Please don't hesitate to contact me if I can be of assistance to you.   Following are the goals we discussed today:   Goals Addressed             This Visit's Progress    COMPLETED: Care Coordination Activities       Care Coordination Interventions: Discussed patient's self perception of how he is doing Confirmed patient is being followed at the [facility] [place]. Reviewed upcoming appointments. Encouraged to call as needed for care coordination needs        If you are experiencing a Mental Health or [place] or need someone to talk to, please call the Suicide and Crisis Lifeline: [phone]  No further follow up required.   [name], RN, MSN, BSN, [place] Coordinator [phone]

## 2022-07-16 NOTE — Patient Outreach (Signed)
  Care Coordination   Follow Up Visit Note   07/16/2022 Name: Rena Marsolek. MRN: [id number] DOB: [birth year]  Jasn Odom Valade. is a 77 y.o. year old male who sees [facility] for primary care. I spoke with  Levie Lynden Ang. by phone today.  What matters to the patients health and wellness today?  Mr. Clemmer reports he is doing well. He reports was seen at the [place] on 07/14/22. He denies any questions or concerns at this time. Patient to contact RNCM if care coordination needs in the future.    Goals Addressed             This Visit's Progress    COMPLETED: Care Coordination Activities       Care Coordination Interventions: Discussed patient's self perception of how he is doing Confirmed patient is being followed at the [place]. Reviewed upcoming appointments. Encouraged to call as needed for care coordination needs        SDOH assessments and interventions completed:  No  Care Coordination Interventions:  Yes, provided   Follow up plan: No further intervention required.   Encounter Outcome:  Pt. Visit Completed   [name], RN, MSN, BSN, [place] Coordinator [phone]

## 2022-08-11 LAB — PROTEIN / CREATININE RATIO, URINE
Albumin, U: 32.3
Creatinine, Urine: 76

## 2022-08-11 LAB — MICROALBUMIN / CREATININE URINE RATIO: Microalb Creat Ratio: 423.3

## 2022-10-25 ENCOUNTER — Ambulatory Visit (INDEPENDENT_AMBULATORY_CARE_PROVIDER_SITE_OTHER): Payer: Medicare Other

## 2022-10-25 ENCOUNTER — Encounter (HOSPITAL_COMMUNITY): Payer: Self-pay

## 2022-10-25 ENCOUNTER — Ambulatory Visit (HOSPITAL_COMMUNITY)
Admission: EM | Admit: 2022-10-25 | Discharge: 2022-10-25 | Disposition: A | Discharge status: Home / Self Care | Payer: Medicare Other | Attending: Family Medicine | Admitting: Family Medicine

## 2022-10-25 DIAGNOSIS — M79641 Pain in right hand: Secondary | ICD-10-CM

## 2022-10-25 DIAGNOSIS — M7989 Other specified soft tissue disorders: Secondary | ICD-10-CM

## 2022-10-25 MED ORDER — AMOXICILLIN-POT CLAVULANATE 875-125 MG PO TABS: 1.0000 | ORAL_TABLET | Freq: Two times a day (BID) | ORAL | 0 refills | Status: AC

## 2022-10-25 NOTE — Discharge Instructions (Signed)
The x-ray showed some arthritis.  Please continue taking the medications you have been prescribed at the [place], for now.  Take amoxicillin-clavulanate 875 mg--1 tab twice daily with food for 7 days; this is for potential infection in your hand.  Keep the follow-up appointment with Dr. Yehuda Budd  If you worsen in any way, with worse swelling or worse pain, then consider going to the emergency room for further evaluation

## 2022-10-25 NOTE — ED Provider Notes (Signed)
[facility]    CSN: [id number] Arrival date & time: 10/25/22  4098      History   Chief Complaint Chief Complaint  Patient presents with   right hand edema    HPI Max [name]. is a 77 y.o. male.   HPI Here for right hand swelling and pain.  Is been going on for about 2 weeks, and has waxed and waned.  No fever or chills.  His sugars are "pretty good", and he states he does have some sweets.  He has had a history of septic arthritis in his left wrist and had an operation to drain it at the end of September 2023.  He was on antibiotics for a period of time.   He was seen by the [facility] and prescribed Voltaren gel and naproxen, on May 10.  So this is active been going on for more than 2 weeks.  Past Medical History:  Diagnosis Date   ANXIETY, SITUATIONAL    DEGENERATIVE DISC DISEASE    DERMATITIS    DIABETES MELLITUS, TYPE II, ON INSULIN, CONTROLLED    Eosinophilia    HYPERLIPIDEMIA    HYPERTENSION    Hypoglycemic event due to diabetes (HCC) 03/24/2022   LOW BACK PAIN    PRURITUS 05/31/2009   Psoriasis    RENAL CELL CANCER 01/25/2005   s/p partial nephrectomy   SEPTIC ARTHRITIS 07/01/2009   L ankle - MSSA   SHOULDER PAIN, RIGHT, CHRONIC     Patient Active Problem List   Diagnosis Date Noted   Hypoglycemic event due to diabetes (HCC) 03/24/2022   Normocytic anemia 02/25/2022   AKI (acute kidney injury) (HCC) 01/28/2022   Statin-induced myositis 08/26/2021   Allergic reaction to detergent 06/10/2021   Numbness and tingling in right hand 06/19/2020   Olecranon bursitis, left elbow 01/30/2020   Cellulitis of right lower extremity 05/17/2017   Iron deficiency anemia 07/23/2016   Polyarthralgia 07/22/2016   Olecranon bursitis of right elbow 05/22/2014   SHOULDER PAIN, RIGHT, CHRONIC 06/10/2010   Anxiety 08/04/2009   Septic arthritis (HCC) 08/04/2009   EOSINOPHILIA 05/29/2009   Diabetes (HCC) 01/16/2009   DERMATITIS 01/16/2009   Hyperlipidemia  01/15/2009   Essential hypertension 01/15/2009   DEGENERATIVE DISC DISEASE 01/15/2009   LOW BACK PAIN 01/15/2009   History of renal cell carcinoma 01/25/2005    Past Surgical History:  Procedure Laterality Date   ARTHROTOMY Left 02/25/2022   Procedure: Left wrist arthrotomy; irrigation and debridement;  Surgeon: Gomez Cleverly, MD;  Location: [facility];  Service: Orthopedics;  Laterality: Left;   BACK SURGERY  04/2005   Dr. Jordan Likes   I & D EXTREMITY Left 01/29/2022   Procedure: IRRIGATION AND DEBRIDEMENT LEFT WRIST JOINT;  Surgeon: Gomez Cleverly, MD;  Location: [facility] OR;  Service: Orthopedics;  Laterality: Left;    LAPAROSCOPIC PARTIAL NEPHRECTOMY  01/25/05   Dr. Isabel Caprice   REPAIR ANKLE LIGAMENT  04/2009   disatal fib/infx       Home Medications    Prior to Admission medications   Medication Sig Start Date End Date Taking? Authorizing Provider  amoxicillin-clavulanate (AUGMENTIN) 875-125 MG tablet Take 1 tablet by mouth 2 (two) times daily for 7 days. 10/25/22 11/01/22 Yes Samariya Rockhold, Janace Aris, MD  albuterol (VENTOLIN HFA) 108 (90 Base) MCG/ACT inhaler Inhale 2 puffs into the lungs 4 (four) times daily as needed for wheezing or shortness of breath. 04/14/21   [provider]  amLODipine (NORVASC) 10 MG tablet Take 1 tablet (10  mg total) by mouth daily. 02/02/12   Newt Lukes, MD  aspirin EC 81 MG tablet Take 81 mg by mouth 3 (three) times a week. 11/18/09   [provider]  FLUoxetine (PROZAC) 20 MG capsule Take 20 mg by mouth daily. 04/29/21   [provider]  insulin glargine-yfgn (SEMGLEE) 100 UNIT/ML Pen Inject 28 Units into the skin daily. 08/13/21   [provider]  lisinopril (ZESTRIL) 10 MG tablet Take 1 tablet (10 mg total) by mouth daily. Patient not taking: Reported on 06/16/2022 06/10/21   Pincus Sanes, MD  lisinopril-hydrochlorothiazide (ZESTORETIC) 10-12.5 MG tablet TAKE 1 TABLET BY MOUTH DAILY FOR BLOOD PRESSURE AND KIDNEY PROTECTION 01/25/22    [provider]  metFORMIN (GLUCOPHAGE) 1000 MG tablet Take 1,000 mg by mouth 2 (two) times daily with a meal.  12/09/10   Corwin Levins, MD  triamcinolone ointment (KENALOG) 0.1 % Apply 1 Application topically 2 (two) times daily as needed (itching). Patient not taking: Reported on 06/16/2022    [provider]    Family History Family History  Problem Relation Age of Onset   Asthma Father     Social History Social History   Tobacco Use   Smoking status: Former    Types: Cigarettes    Quit date: 05/31/1994    Years since quitting: 28.4   Smokeless tobacco: Never   Tobacco comments:    Married, lives with wife. 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 --      Excl. in GC? --    No data found.  Updated Vital Signs BP 137/74 (BP Location: Right Arm)   Pulse 77   Temp 98.3 F (36.8 C) (Oral)   Resp 16   SpO2 97%   Visual Acuity Right Eye Distance:   Left Eye Distance:   Bilateral Distance:    Right Eye Near:   Left Eye Near:    Bilateral Near:     Physical Exam Vitals reviewed.  Constitutional:      General: He is not in acute distress.    Appearance: He is not ill-appearing, toxic-appearing or diaphoretic.  Cardiovascular:     Rate and Rhythm: Normal rate and regular rhythm.     Heart sounds: No murmur heard. Pulmonary:     Effort:  Pulmonary effort is normal.     Breath sounds: Normal breath sounds.  Musculoskeletal:     Comments: The dorsum of the right hand and especially along the MCP joints, is swollen.  The skin does not seem  to be indurated.  Skin:    Coloration: Skin is not jaundiced or pale.     Comments: There is a little bit of bumpy rash on the dorsum of both hands.  Neurological:     General: No focal deficit present.     Mental Status: He is alert.  Psychiatric:        Behavior: Behavior normal.      UC Treatments / Results  Labs (all labs ordered are listed, but only abnormal results are displayed) Labs Reviewed - No data to display  EKG   Radiology DG Hand Complete Right  Result Date: 10/25/2022 CLINICAL DATA:  Pain and swelling right hand around the metacarpophalangeal joints for 2 weeks. EXAM: RIGHT HAND - COMPLETE 3+ VIEW COMPARISON:  None Available. FINDINGS: There is neutral ulnar variance. Mildly decreased bone mineralization. 2nd through fifth mild-to-moderate DIP and mild PIP joint space narrowing and peripheral osteophytosis. Mild thumb metacarpophalangeal and interphalangeal joint space narrowing. Mild thumb carpometacarpal joint space narrowing and peripheral osteophytosis. The cortices are intact. No acute fracture or dislocation. IMPRESSION: Mild-to-moderate interphalangeal and thumb carpometacarpal osteoarthritis. Electronically Signed   By: Neita Garnet M.D.   On: 10/25/2022 09:43    Procedures Procedures (including critical care time)  Medications Ordered in UC Medications - No data to display  Initial Impression / Assessment and Plan / UC Course  I have reviewed the triage vital signs and the nursing notes.  Pertinent labs & imaging results that were available during my care of the patient were reviewed by me and considered in my medical decision making (see chart for details).        X-ray shows some arthritic changes.  I am going to ask [facility] and [facility] in Augmentin for 1 week.  He states he has follow-up with his hand surgeon on June 3, in 1 week.  I have asked him to keep that follow-up.  If he worsens in any way he is to go to the emergency room Final Clinical Impressions(s) / UC Diagnoses   Final diagnoses:  Swelling of hand  Pain of right hand     Discharge Instructions      The x-ray showed some arthritis.  Please continue taking the medications you have been prescribed at the [place], for now.  Take amoxicillin-clavulanate 875 mg--1 tab twice daily with food for 7 days; this is for potential infection in your hand.  Keep the follow-up appointment with Dr. Yehuda Budd  If you worsen in any way, with worse swelling or worse pain, then consider going to the emergency room for further evaluation     ED Prescriptions     Medication Sig Dispense Auth. Provider   amoxicillin-clavulanate (AUGMENTIN) 875-125 MG tablet Take 1 tablet by mouth 2 (two) times daily for 7 days. 14 tablet Kianah Harries, Janace Aris, MD      PDMP not reviewed this encounter.   Zenia Resides, MD 10/25/22 [phone]

## 2022-10-25 NOTE — ED Triage Notes (Signed)
Pt c/o right hand swelling onset "a couple of weeks ago" says he was seen by a [place] provider and given PO tylenol and topical voltaren without resolution. Has had recent surgery on the left hand but unable to articulate the kind of surgery. Says the right hand is painful in addition to edematous. Denies known trauma or injury to the area.

## 2023-03-21 NOTE — Progress Notes (Signed)
CREATININE,RANDOM URINE 76 mg/dL  82-956      URINE ALBUMIN,QUANTITATIVE 32.3 mg/dL H 2.1-3.0    URINE ALBUMIN/CREAT RATIO RESULT 423.3

## 2023-04-15 LAB — HEMOGLOBIN A1C: Hemoglobin A1C: 7.1

## 2023-05-21 DIAGNOSIS — R404 Transient alteration of awareness: Secondary | ICD-10-CM | POA: Diagnosis not present

## 2023-05-21 DIAGNOSIS — E161 Other hypoglycemia: Secondary | ICD-10-CM | POA: Diagnosis not present

## 2023-05-21 DIAGNOSIS — E162 Hypoglycemia, unspecified: Secondary | ICD-10-CM | POA: Diagnosis not present

## 2023-06-22 ENCOUNTER — Encounter: Payer: Self-pay | Admitting: Internal Medicine

## 2023-06-22 ENCOUNTER — Ambulatory Visit (INDEPENDENT_AMBULATORY_CARE_PROVIDER_SITE_OTHER): Payer: Medicare Other | Admitting: Internal Medicine

## 2023-06-22 VITALS — BP 138/70 | HR 78 | Temp 98.2°F | Resp 16 | Ht 66.0 in | Wt 150.5 lb

## 2023-06-22 DIAGNOSIS — E11649 Type 2 diabetes mellitus with hypoglycemia without coma: Secondary | ICD-10-CM | POA: Diagnosis not present

## 2023-06-22 DIAGNOSIS — Z794 Long term (current) use of insulin: Secondary | ICD-10-CM | POA: Diagnosis not present

## 2023-06-22 DIAGNOSIS — L853 Xerosis cutis: Secondary | ICD-10-CM | POA: Diagnosis not present

## 2023-06-22 MED ORDER — AMMONIUM LACTATE 12 % EX LOTN: 1.0000 | TOPICAL_LOTION | CUTANEOUS | 8 refills | Status: AC | PRN

## 2023-06-22 NOTE — Patient Instructions (Addendum)
   Over the counter lotions for dry skin  Vanicream Moisturizing Lotion CeraVe Moisturizing Cream... Dove's Body Love Sensitive Care Body Lotion... Amlactin Daily Moisturizing Lotion for Dry Skin... Vaseline Intensive Care Sensitive Skin Relief Lotion...     Try lac-hydrin lotion for your dry skin - this was sent to your pharmacy   Dry Skin Care  What causes dry skin?  Dry skin is common and results from inadequate moisture in the outer skin layers. Dry skin usually results from the excessive loss of moisture from the skin surface. This occurs due to two major factors: Normally the skin's oil glands deposit a layer of oil on the skin's surface. This layer of oil prevents the loss of moisture from the skin. Exposure to soaps, cleaners, solvents, and disinfectants removes this oily film, allowing water to escape. Water loss from the skin increases when the humidity is low. During winter months we spend a lot of time indoors where the air is heated. Heated air has very low humidity. This also contributes to dry skin.  A tendency for dry skin may accompany such disorders as eczema. Also, as people age, the number of functioning oil glands decreases, and the tendency toward dry skin can be a sensation of skin tightness when emerging from the shower.  How do I manage dry skin?  Humidify your environment. This can be accomplished by using a humidifier in your bedroom at night during winter months. Bathing can actually put moisture back into your skin if done right. Take the following steps while bathing to sooth dry skin: Avoid hot water, which only dries the skin and makes itching worse. Use warm water. Avoid washcloths or extensive rubbing or scrubbing. Use mild soaps like unscented Dove, Oil of Olay, Cetaphil, Basis, or CeraVe. If you take baths rather than showers, rinse off soap residue with clean water before getting out of tub. Once out of the shower/tub, pat dry gently with a soft  towel. Leave your skin damp. While still damp, apply any medicated ointment/cream you were prescribed to the affected areas. After you apply your medicated ointment/cream, then apply your moisturizer to your whole body.This is the most important step in dry skin care. If this is omitted, your skin will continue to be dry. The choice of moisturizer is also very important. In general, lotion will not provider enough moisture to severely dry skin because it is water based. You should use an ointment or cream. Moisturizers should also be unscented. Good choices include Vaseline (plain petrolatum), Aquaphor, Cetaphil, CeraVe, Vanicream, DML Forte, Aveeno moisture, or Eucerin Cream. Bath oils can be helpful, but do not replace the application of moisturizer after the bath. In addition, they make the tub slippery causing an increased risk for falls. Therefore, we do not recommend their use.

## 2023-06-22 NOTE — Assessment & Plan Note (Signed)
[facility] Last A1c 7.1%

## 2023-06-22 NOTE — Assessment & Plan Note (Signed)
Acute Was seen at the [place] - given silver sulfadiazine and petroleum jelly Above is not helping by eye skin was just on his face and arms and his scalp Trial of Lac-Hydrin 12% lotion If this is not completely covered or high co-pay advised to use an over-the-counter product-list given to what we can try He is taking several showers a day-advised to shower only once a day Washes his face in hot water-advised to use lukewarm or cool water for his face and not to wash more than once or twice a day Follow-up if no improvement

## 2023-06-22 NOTE — Progress Notes (Signed)
    Subjective:    Patient ID: Max Prinz., male    DOB: [birth year], 78 y.o.   MRN: [id number]      HPI Max [name] is here for  Chief Complaint  Patient presents with   Skin Issue    Dry skin and rash on face, flaky skin and head itching, has been using stuff from [place], but not helping     [facility] petroleom [facility] and silver sulfadiazine.  Skin on face is dry and scaly.       Medications and allergies reviewed with patient and updated if appropriate.  Current Outpatient Medications on File Prior to Visit  Medication Sig Dispense Refill   albuterol (VENTOLIN HFA) 108 (90 Base) MCG/ACT inhaler Inhale 2 puffs into the lungs 4 (four) times daily as needed for wheezing or shortness of breath.     amLODipine (NORVASC) 10 MG tablet Take 1 tablet (10 mg total) by mouth daily. 90 tablet 1   aspirin EC 81 MG tablet Take 81 mg by mouth 3 (three) times a week.     insulin glargine-yfgn (SEMGLEE) 100 UNIT/ML Pen Inject 28 Units into the skin daily.     lisinopril (ZESTRIL) 10 MG tablet Take 1 tablet (10 mg total) by mouth daily. 90 tablet 3   metFORMIN (GLUCOPHAGE) 1000 MG tablet Take 1,000 mg by mouth 2 (two) times daily with a meal.      silver sulfADIAZINE (SILVADENE) 1 % cream Apply topically.     triamcinolone ointment (KENALOG) 0.1 % Apply 1 Application topically 2 (two) times daily as needed (itching).     White Petrolatum (PETROLATUM WHITE) OINT Apply topically.     No current facility-administered medications on file prior to visit.    Review of Systems     Objective:   Vitals:   06/22/23 1259  BP: 138/70  Pulse: 78  Resp: 16  Temp: 98.2 F (36.8 C)  SpO2: 99%   BP Readings from Last 3 Encounters:  06/22/23 138/70  10/25/22 137/74  07/04/22 (!) 172/72   Wt Readings from Last 3 Encounters:  06/22/23 150 lb 8 oz (68.3 kg)  06/16/22 153 lb (69.4 kg)  06/08/22 154 lb (69.9 kg)   Body mass index is 24.29 kg/m.    Physical Exam Constitutional:       General: He is not in acute distress.    Appearance: Normal appearance. He is not ill-appearing.  HENT:     Head: Normocephalic and atraumatic.  Skin:    General: Skin is warm and dry.     Comments: Moderately dry skin on face  Neurological:     Mental Status: He is alert.            Assessment & Plan:    See Problem List for Assessment and Plan of chronic medical problems.

## 2023-06-23 ENCOUNTER — Encounter: Payer: Self-pay | Admitting: Internal Medicine

## 2023-06-23 NOTE — Progress Notes (Signed)
Outside notes received. Information abstracted. Notes sent to scan.

## 2023-08-31 ENCOUNTER — Ambulatory Visit (INDEPENDENT_AMBULATORY_CARE_PROVIDER_SITE_OTHER)

## 2023-08-31 VITALS — Ht 66.0 in | Wt 150.0 lb

## 2023-08-31 DIAGNOSIS — Z1211 Encounter for screening for malignant neoplasm of colon: Secondary | ICD-10-CM | POA: Diagnosis not present

## 2023-08-31 DIAGNOSIS — Z0000 Encounter for general adult medical examination without abnormal findings: Secondary | ICD-10-CM | POA: Diagnosis not present

## 2023-08-31 NOTE — Progress Notes (Signed)
 Subjective:   Max [name]. is a 78 y.o. who presents for a Medicare Wellness preventive visit.  Visit Complete: Virtual I connected with  Max [name]. on 08/31/23 by a audio enabled telemedicine application and verified that I am speaking with the correct person using two identifiers.  Patient Location: Home  Provider Location: Home Office  I discussed the limitations of evaluation and management by telemedicine. The patient expressed understanding and agreed to proceed.  Vital Signs: Because this visit was a virtual/telehealth visit, some criteria may be missing or patient reported. Any vitals not documented were not able to be obtained and vitals that have been documented are patient reported.  VideoDeclined- This patient declined Librarian, academic. Therefore the visit was completed with audio only.  Persons Participating in Visit: Patient.  AWV Questionnaire: No: Patient Medicare AWV questionnaire was not completed prior to this visit.  Cardiac Risk Factors include: male gender;hypertension;diabetes mellitus;advanced age (>61men, >56 women);dyslipidemia     Objective:    Today's Vitals   08/31/23 1044  Weight: 150 lb (68 kg)  Height: 5\' 6"  (1.676 m)   Body mass index is 24.21 kg/m.     08/31/2023   11:05 AM 07/03/2022    3:35 PM 02/25/2022    7:00 PM 02/24/2022   10:21 AM 01/26/2022    7:20 PM 01/09/2022    2:48 PM 10/12/2021   11:05 AM  Advanced Directives  Does Patient Have a Medical Advance Directive? Yes No Yes Yes No Yes No  Type of Estate agent of [place];Living will  Healthcare Power of [place];Living will Living will;Healthcare Power of Attorney     Does patient want to make changes to medical advance directive?   No - Patient declined No - Patient declined     Copy of Healthcare Power of Attorney in Chart? No - copy requested  No - copy requested No - copy requested     Would patient like information  on creating a medical advance directive?     No - Patient declined  No - Patient declined    Current Medications (verified) Outpatient Encounter Medications as of 08/31/2023  Medication Sig   albuterol (VENTOLIN HFA) 108 (90 Base) MCG/ACT inhaler Inhale 2 puffs into the lungs 4 (four) times daily as needed for wheezing or shortness of breath.   amLODipine (NORVASC) 10 MG tablet Take 1 tablet (10 mg total) by mouth daily.   ammonium lactate (LAC-HYDRIN) 12 % lotion Apply 1 Application topically as needed for dry skin.   aspirin EC 81 MG tablet Take 81 mg by mouth 3 (three) times a week.   donepezil (ARICEPT) 10 MG tablet Take 10 mg by mouth.   escitalopram (LEXAPRO) 10 MG tablet Take 10 mg by mouth daily.   insulin glargine-yfgn (SEMGLEE) 100 UNIT/ML Pen Inject 28 Units into the skin daily.   lisinopril (ZESTRIL) 10 MG tablet Take 1 tablet (10 mg total) by mouth daily.   metFORMIN (GLUCOPHAGE) 1000 MG tablet Take 1,000 mg by mouth 2 (two) times daily with a meal.    silver sulfADIAZINE (SILVADENE) 1 % cream Apply topically.   triamcinolone ointment (KENALOG) 0.1 % Apply 1 Application topically 2 (two) times daily as needed (itching).   White Petrolatum (PETROLATUM WHITE) OINT Apply topically.   No facility-administered encounter medications on file as of 08/31/2023.    Allergies (verified) Diclofenac, Zocor [simvastatin], and Chlorhexidine   History: Past Medical History:  Diagnosis Date   ANXIETY, SITUATIONAL  DEGENERATIVE DISC DISEASE    DERMATITIS    DIABETES MELLITUS, TYPE II, ON INSULIN, CONTROLLED    Eosinophilia    HYPERLIPIDEMIA    HYPERTENSION    Hypoglycemic event due to diabetes (HCC) 03/24/2022   LOW BACK PAIN    PRURITUS 05/31/2009   Psoriasis    RENAL CELL CANCER 01/25/2005   s/p partial nephrectomy   SEPTIC ARTHRITIS 07/01/2009   L ankle - MSSA   SHOULDER PAIN, RIGHT, CHRONIC    Past Surgical History:  Procedure Laterality Date   ARTHROTOMY Left 02/25/2022    Procedure: Left wrist arthrotomy; irrigation and debridement;  Surgeon: Gomez Cleverly, MD;  Location: [facility];  Service: Orthopedics;  Laterality: Left;   BACK SURGERY  04/2005   Dr. Jordan Likes   I & D EXTREMITY Left 01/29/2022   Procedure: IRRIGATION AND DEBRIDEMENT LEFT WRIST JOINT;  Surgeon: Gomez Cleverly, MD;  Location: [facility];  Service: Orthopedics;  Laterality: Left;    LAPAROSCOPIC PARTIAL NEPHRECTOMY  01/25/05   Dr. Isabel Caprice   REPAIR ANKLE LIGAMENT  04/2009   disatal fib/infx   Family History  Problem Relation Age of Onset   Asthma Father    Social History   Socioeconomic History   Marital status: Widowed    Spouse name: Not on file   Number of children: Not on file   Years of education: Not on file   Highest education level: Not on file  Occupational History   Not on file  Tobacco Use   Smoking status: Former    Current packs/day: 0.00    Types: Cigarettes    Quit date: 05/31/1994    Years since quitting: 29.2   Smokeless tobacco: Never   Tobacco comments:    Married, lives with wife. 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 Used  Substance and Sexual Activity   Alcohol use: Yes    Comment: occasionally   Drug use: No   Sexual activity: Not on file  Other Topics Concern   Not on file  Social History Narrative   Grandson lives with patient/2025   Social Drivers of Health   Financial Resource Strain: Low Risk  (08/31/2023)   Overall Financial Resource Strain (CARDIA)    Difficulty of Paying Living Expenses: Not very hard  Food Insecurity: No Food Insecurity (08/31/2023)   Hunger Vital Sign    Worried About Running Out of Food in the Last Year: Never true    Ran Out of Food in the Last Year: Never true  Transportation Needs: No Transportation Needs (08/31/2023)   PRAPARE - Administrator, Civil Service (Medical): No    Lack of Transportation (Non-Medical): No  Physical Activity: Sufficiently Active (08/31/2023)   Exercise Vital Sign    Days  of Exercise per Week: 7 days    Minutes of Exercise per Session: 30 min  Stress: Stress Concern Present (08/31/2023)   [employer] of Occupational Health - Occupational Stress Questionnaire    Feeling of Stress : To some extent  Social Connections: Moderately Isolated (08/31/2023)   Social Connection and Isolation Panel [NHANES]    Frequency of Communication with Friends and Family: More than three times a week    Frequency of Social Gatherings with Friends and Family: More than three times a week    Attends Religious Services: 1 to 4 times per year    Active Member of [employer] or Organizations: No    Attends Banker Meetings: Never  Marital Status: Widowed    Tobacco Counseling Counseling given: Not Answered Tobacco comments: Married, lives with wife. 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 bring in CBG monitor from home?: No  Lab Results  Component Value Date   HGBA1C 7.1 04/15/2023   HGBA1C 7.2 (H) 02/25/2022   HGBA1C 7.6 (H) 08/26/2021     How often do you need to have someone help you when you read instructions, pamphlets, or other written materials from your doctor or pharmacy?: 1 - Never  Interpreter Needed?: No  Information entered by :: Ashlynne Shetterly, RMA   Activities of Daily Living     08/31/2023   11:00 AM  In your present state of health, do you have any difficulty performing the following activities:  Hearing? 1  Comment Wears hearing aides  Vision? 0  Difficulty concentrating or making decisions? 1  Comment sometimes  Walking or climbing stairs? 0  Dressing or bathing? 0  Doing errands, shopping? 0  Preparing Food and eating ? N  Using the Toilet? N  In the past six months, have you accidently leaked urine? N  Do you have problems with  loss of bowel control? N  Managing your Medications? N  Managing your Finances? N  Housekeeping or managing your Housekeeping? N    Patient Care Team: Pincus Sanes, MD as PCP - General (Internal Medicine) Laurice Record, MD (Hematology and Oncology) Rachael Fee, MD (Inactive) (Gastroenterology)  Indicate any recent Medical Services you may have received from other than [facility] providers in the past year (date may be approximate).     Assessment:   This is a routine wellness examination for Max [name].  Hearing/Vision screen Hearing Screening - Comments:: Wears hearing aides Vision Screening - Comments:: Denies vision issues.    Goals Addressed             This Visit's Progress    My goal is to maintain my independence and stay alive.   On track      Depression Screen     08/31/2023   11:10 AM 06/22/2023    1:12 PM 06/08/2022   11:17 AM 04/26/2022   10:07 AM 03/24/2022   10:39 AM 03/10/2022    9:58 AM 02/17/2022   10:29 AM  PHQ 2/9 Scores  PHQ - 2 Score 1 0 1 1 0 0 0  PHQ- 9 Score 4          Fall Risk     08/31/2023   11:05 AM 06/22/2023    1:12 PM 06/16/2022   10:11 AM 06/08/2022   11:17 AM 04/26/2022   10:07 AM  Fall Risk   Falls in the past year? 0 0 0 0 0  Number falls in past yr: 0 0  0 0  Injury with Fall? 0 0  0 0  Risk for fall due to : No Fall Risks No Fall Risks No Fall Risks No Fall Risks No Fall Risks  Follow up Falls prevention discussed;Falls evaluation completed Falls evaluation completed Falls evaluation completed Falls evaluation completed Falls evaluation completed    MEDICARE RISK AT HOME:  Medicare Risk at Home Any stairs in or around the home?: No If so, are there any without handrails?: No Home free of loose throw rugs in walkways,  pet beds, electrical cords, etc?: Yes Adequate lighting in your home to reduce risk of falls?: Yes Life alert?: Yes (does not wear it) Use of a cane, walker or w/c?: Yes (cane) Grab bars in the  bathroom?: Yes Shower chair or bench in shower?: Yes Elevated toilet seat or a handicapped toilet?: Yes  TIMED UP AND GO:  Was the test performed?  No  Cognitive Function: 6CIT completed        08/31/2023   11:06 AM 10/12/2021   11:25 AM  6CIT Screen  What Year? 0 points 0 points  What month? 0 points 0 points  What time? 0 points 0 points  Count back from 20 0 points 0 points  Months in reverse -- 0 points  Repeat phrase 0 points 0 points  Total Score  0 points    Immunizations Immunization History  Administered Date(s) Administered   Influenza Split 02/28/2018   Influenza, High Dose Seasonal PF 06/19/2023   Influenza-Unspecified 01/29/2009, 01/30/2012, 03/31/2013, 04/01/2015, 03/31/2018, 02/29/2020, 03/14/2021, 06/18/2022   Moderna Sars-Covid-2 Vaccination 07/27/2019, 08/24/2019   Pneumococcal-Unspecified 04/15/2009   Td 05/31/1998, 06/01/2007   Tdap 07/30/2010    Screening Tests Health Maintenance  Topic Date Due   OPHTHALMOLOGY EXAM  Never done   FOOT EXAM  07/04/2014   COVID-19 Vaccine (3 - Moderna risk series) 09/21/2019   Diabetic kidney evaluation - eGFR measurement  06/17/2023   Diabetic kidney evaluation - Urine ACR  08/11/2023   Zoster Vaccines- Shingrix (1 of 2) 09/20/2023 (Originally 06/24/1964)   DTaP/Tdap/Td (4 - Td or Tdap) 06/21/2024 (Originally 07/29/2020)   Pneumonia Vaccine 31+ Years old (1 of 2 - PCV) 06/21/2024 (Originally 06/24/1951)   HEMOGLOBIN A1C  10/13/2023   INFLUENZA VACCINE  12/30/2023   Medicare Annual Wellness (AWV)  08/30/2024   Hepatitis C Screening  Completed   HPV VACCINES  Aged Out   Colonoscopy  Discontinued    Health Maintenance  Health Maintenance Due  Topic Date Due   OPHTHALMOLOGY EXAM  Never done   FOOT EXAM  07/04/2014   COVID-19 Vaccine (3 - Moderna risk series) 09/21/2019   Diabetic kidney evaluation - eGFR measurement  06/17/2023   Diabetic kidney evaluation - Urine ACR  08/11/2023   Health Maintenance Items  Addressed: See Nurse Notes  Additional Screening:  Vision Screening: Recommended annual ophthalmology exams for early detection of glaucoma and other disorders of the eye.  Dental Screening: Recommended annual dental exams for proper oral hygiene  Community Resource Referral / Chronic Care Management: CRR required this visit?  No   CCM required this visit?  No     Plan:     I have personally reviewed and noted the following in the patient's chart:   Medical and social history Use of alcohol, tobacco or illicit drugs  Current medications and supplements including opioid prescriptions. Patient is not currently taking opioid prescriptions. Functional ability and status Nutritional status Physical activity Advanced directives List of other physicians Hospitalizations, surgeries, and ER visits in previous 12 months Vitals Screenings to include cognitive, depression, and falls Referrals and appointments  In addition, I have reviewed and discussed with patient certain preventive protocols, quality metrics, and best practice recommendations. A written personalized care plan for preventive services as well as general preventive health recommendations were provided to patient.     Barbette Mcglaun L Helane Briceno, CMA   08/31/2023   After Visit Summary: (Mail) Due to this being a telephonic visit, the after visit summary with patients personalized plan was  offered to patient via mail   Notes: Please refer to Routing Comments.

## 2023-08-31 NOTE — Patient Instructions (Signed)
 [name] , Thank you for taking time to come for your Medicare Wellness Visit. I appreciate your ongoing commitment to your health goals. Please review the following plan we discussed and let me know if I can assist you in the future.   Referrals/Orders/Follow-Ups/Clinician Recommendations: It was nice talking with you today.    This is a list of the screening recommended for you and due dates:  Health Maintenance  Topic Date Due   Eye exam for diabetics  Never done   Complete foot exam   07/04/2014   COVID-19 Vaccine (3 - Moderna risk series) 09/21/2019   Medicare Annual Wellness Visit  10/13/2022   Yearly kidney function blood test for diabetes  06/17/2023   Yearly kidney health urinalysis for diabetes  08/11/2023   Zoster (Shingles) Vaccine (1 of 2) 09/20/2023*   DTaP/Tdap/Td vaccine (4 - Td or Tdap) 06/21/2024*   Pneumonia Vaccine (1 of 2 - PCV) 06/21/2024*   Hemoglobin A1C  10/13/2023   Flu Shot  12/30/2023   Hepatitis C Screening  Completed   HPV Vaccine  Aged Out   Colon Cancer Screening  Discontinued  *Topic was postponed. The date shown is not the original due date.    Advanced directives: (Copy Requested) Please bring a copy of your health care power of attorney and living will to the office to be added to your chart at your convenience. You can mail to [facility] [address] [place], [place] [address] or email to [email or web address]  Next Medicare Annual Wellness Visit scheduled for next year: Yes

## 2024-02-23 DIAGNOSIS — N1831 Chronic kidney disease, stage 3a: Secondary | ICD-10-CM | POA: Insufficient documentation

## 2024-02-23 NOTE — Progress Notes (Unsigned)
 Subjective:    Patient ID: Max Sookdeo., male    DOB: 03-19-1946, 78 y.o.   MRN: [id number]      HPI Max [name] is here for No chief complaint on file.  Cc: dry skin   Mind goes blank sometimes.  Forgets stuff a lot.  Hearing is bad.  Got hearing aids - lost them.  Getting new ones today.  [facility] put [facility] for his memory.  He does not drive long distances.    Still showering multiple times a day-hot water   Does not drive much  Dry skin.  Needs to get more lotion.  Not sure what to use.  Has lots of dead skin.     Medications and allergies reviewed with patient and updated if appropriate.  Current Outpatient Medications on File Prior to Visit  Medication Sig Dispense Refill   albuterol  (VENTOLIN  HFA) 108 (90 Base) MCG/ACT inhaler Inhale 2 puffs into the lungs 4 (four) times daily as needed for wheezing or shortness of breath.     amLODipine  (NORVASC ) 10 MG tablet Take 1 tablet (10 mg total) by mouth daily. 90 tablet 1   ammonium lactate  (LAC-HYDRIN ) 12 % lotion Apply 1 Application topically as needed for dry skin. 400 g 8   aspirin  EC 81 MG tablet Take 81 mg by mouth 3 (three) times a week.     donepezil (ARICEPT) 10 MG tablet Take 10 mg by mouth.     escitalopram (LEXAPRO) 10 MG tablet Take 10 mg by mouth daily.     insulin  glargine-yfgn (SEMGLEE ) 100 UNIT/ML Pen Inject 28 Units into the skin daily.     lisinopril  (ZESTRIL ) 10 MG tablet Take 1 tablet (10 mg total) by mouth daily. 90 tablet 3   metFORMIN  (GLUCOPHAGE ) 1000 MG tablet Take 1,000 mg by mouth 2 (two) times daily with a meal.      silver sulfADIAZINE (SILVADENE) 1 % cream Apply topically.     triamcinolone ointment (KENALOG) 0.1 % Apply 1 Application topically 2 (two) times daily as needed (itching).     White Petrolatum (PETROLATUM WHITE) OINT Apply topically.     No current facility-administered medications on file prior to visit.    Review of Systems  Constitutional:  Negative for fever.   Respiratory:  Negative for cough, shortness of breath and wheezing.   Cardiovascular:  Negative for chest pain, palpitations and leg swelling.  Neurological:  Negative for light-headedness and headaches.       Objective:   Vitals:   02/24/24 1424 02/24/24 1430  BP: (!) 160/84 (!) 150/80  Pulse: 66   Temp: 98.3 F (36.8 C)   SpO2: 98%    BP Readings from Last 3 Encounters:  02/24/24 (!) 150/80  06/22/23 138/70  10/25/22 137/74   Wt Readings from Last 3 Encounters:  02/24/24 155 lb (70.3 kg)  08/31/23 150 lb (68 kg)  06/22/23 150 lb 8 oz (68.3 kg)   Body mass index is 25.02 kg/m.    Physical Exam Constitutional:      General: He is not in acute distress.    Appearance: Normal appearance. He is not ill-appearing.  HENT:     Head: Normocephalic and atraumatic.  Eyes:     Conjunctiva/sclera: Conjunctivae normal.  Cardiovascular:     Rate and Rhythm: Normal rate and regular rhythm.     Heart sounds: Normal heart sounds.  Pulmonary:     Effort: Pulmonary effort is normal. No respiratory distress.  Breath sounds: Normal breath sounds. No wheezing or rales.  Musculoskeletal:     Right lower leg: No edema.     Left lower leg: No edema.  Skin:    General: Skin is warm.     Findings: No rash.     Comments: Dry skin, thickened skin from dryness  Neurological:     Mental Status: He is alert. Mental status is at baseline.  Psychiatric:        Mood and Affect: Mood normal.            Assessment & Plan:    See Problem List for Assessment and Plan of chronic medical problems.

## 2024-02-24 ENCOUNTER — Encounter: Payer: Self-pay | Admitting: Internal Medicine

## 2024-02-24 ENCOUNTER — Ambulatory Visit: Admitting: Internal Medicine

## 2024-02-24 VITALS — BP 150/80 | HR 66 | Temp 98.3°F | Ht 66.0 in | Wt 155.0 lb

## 2024-02-24 DIAGNOSIS — G3184 Mild cognitive impairment, so stated: Secondary | ICD-10-CM | POA: Diagnosis not present

## 2024-02-24 DIAGNOSIS — I152 Hypertension secondary to endocrine disorders: Secondary | ICD-10-CM

## 2024-02-24 DIAGNOSIS — L853 Xerosis cutis: Secondary | ICD-10-CM | POA: Diagnosis not present

## 2024-02-24 DIAGNOSIS — E1169 Type 2 diabetes mellitus with other specified complication: Secondary | ICD-10-CM

## 2024-02-24 DIAGNOSIS — E1122 Type 2 diabetes mellitus with diabetic chronic kidney disease: Secondary | ICD-10-CM

## 2024-02-24 DIAGNOSIS — E1159 Type 2 diabetes mellitus with other circulatory complications: Secondary | ICD-10-CM

## 2024-02-24 DIAGNOSIS — N1831 Chronic kidney disease, stage 3a: Secondary | ICD-10-CM | POA: Diagnosis not present

## 2024-02-24 NOTE — Assessment & Plan Note (Signed)
 Chronic Managed at [facility] Will get copy of most recent labs

## 2024-02-24 NOTE — Assessment & Plan Note (Signed)
 Chronic Taking up to 5 showers a day in hot water  Decrease showers to 1 - no more than 2 and use leukwarm water  Start using otc moisturizer regularly - can try -  Nivea Essentially Enriched Body Lotion CeraVe Daily Moisturizing Lotion Lac-Hydrin  lotion Eucerin Lotion

## 2024-02-24 NOTE — Assessment & Plan Note (Addendum)
 Chronic Blood pressure not controlled-[facility] is [facility] Continue amlodipine  10 mg daily, lisinopril  10 mg daily Ad[facility]u with [facility]

## 2024-02-24 NOTE — Assessment & Plan Note (Addendum)
 [facility] per [facility] Last A1c 7.4%

## 2024-02-24 NOTE — Assessment & Plan Note (Signed)
 Chronic Management per [facility] On donepezil 10 mg daily

## 2024-02-24 NOTE — Patient Instructions (Addendum)
   For your dry skin   Limit showers to 1-2 times a day -- avoid hot water .    Best lotions for skin -   Nivea Essentially Enriched Body Lotion CeraVe Daily Moisturizing Lotion Lac-Hydrin  lotion Eucerin Lotion

## 2024-06-16 ENCOUNTER — Ambulatory Visit
Admission: EM | Admit: 2024-06-16 | Discharge: 2024-06-16 | Disposition: A | Discharge status: Home / Self Care | Attending: Student | Admitting: Student

## 2024-06-16 ENCOUNTER — Encounter: Payer: Self-pay | Admitting: Emergency Medicine

## 2024-06-16 DIAGNOSIS — H0100A Unspecified blepharitis right eye, upper and lower eyelids: Secondary | ICD-10-CM | POA: Diagnosis not present

## 2024-06-16 DIAGNOSIS — H0100B Unspecified blepharitis left eye, upper and lower eyelids: Secondary | ICD-10-CM

## 2024-06-16 MED ORDER — NEOMYCIN-POLYMYXIN-DEXAMETH 3.5-10000-0.1 OP OINT: 1.0000 | TOPICAL_OINTMENT | Freq: Three times a day (TID) | OPHTHALMIC | 1 refills | Status: AC

## 2024-06-16 NOTE — ED Triage Notes (Signed)
 Patient had got a hydrocortisone cream for his face and now he is having irration and swelling in both eyes.  Patient is suppose to have on the eye on 07/05/2024.  Patient denies any pain.

## 2024-06-16 NOTE — ED Provider Notes (Signed)
 " [facility]    CSN: [id number] Arrival date & time: 06/16/24  0907      History   Chief Complaint Chief Complaint  Patient presents with   Eye Problem    HPI Max [name]. is a 79 y.o. male presenting w bilateral eye issue. H/o psoriasis, Xerosis of skin. Has been applying hydrocortisone OTC to the face for dry skin dermatitis. States he has been using hydrocortisone intermittently for years, unsure how long he has been applying it recently. His eyes have been irritated since getting the hydrocortisone in them. His eyes feel like they are irritated, burning, watery. Mild photophobia. Denies foreign body sensation, floaters, flashes of light. Has R cataract surgery scheduled for 07/05/24.  Does not wear glasses or contacts.  HPI  Past Medical History:  Diagnosis Date   ANXIETY, SITUATIONAL    DEGENERATIVE DISC DISEASE    DERMATITIS    DIABETES MELLITUS, TYPE II, ON INSULIN , CONTROLLED    Eosinophilia    HYPERLIPIDEMIA    HYPERTENSION    Hypoglycemic event due to diabetes (HCC) 03/24/2022   LOW BACK PAIN    PRURITUS 05/31/2009   Psoriasis    RENAL CELL CANCER 01/25/2005   s/p partial nephrectomy   SEPTIC ARTHRITIS 07/01/2009   L ankle - [facility]A   SHOULDER PAIN, RIGHT, CHRONIC     Patient Active Problem List   Diagnosis Date Noted   Mild neurocognitive disorder 02/24/2024   CKD stage 3a, GFR 45-59 ml/min (HCC) 02/23/2024   Xerosis of skin 06/22/2023   Normocytic anemia 02/25/2022   Statin-induced myositis 08/26/2021   Allergic reaction to detergent 06/10/2021   Numbness and tingling in right hand 06/19/2020   Olecranon bursitis, left elbow 01/30/2020   Iron deficiency anemia 07/23/2016   Polyarthralgia 07/22/2016   Olecranon bursitis of right elbow 05/22/2014   SHOULDER PAIN, RIGHT, CHRONIC 06/10/2010   Anxiety 08/04/2009   EOSINOPHILIA 05/29/2009   Diabetes mellitus with stage 3a chronic kidney disease, without long-term current use of insulin  (HCC)  01/16/2009   DERMATITIS 01/16/2009   Hyperlipidemia associated with type 2 diabetes mellitus (HCC) 01/15/2009   Hypertension associated with type 2 diabetes mellitus (HCC) 01/15/2009   DEGENERATIVE DISC DISEASE 01/15/2009   LOW BACK PAIN 01/15/2009   History of renal cell carcinoma 01/25/2005    Past Surgical History:  Procedure Laterality Date   ARTHROTOMY Left 02/25/2022   Procedure: Left wrist arthrotomy; irrigation and debridement;  Surgeon: Alyse Agent, MD;  Location: [facility];  Service: Orthopedics;  Laterality: Left;   BACK SURGERY  04/2005   Dr. Louis   I & D EXTREMITY Left 01/29/2022   Procedure: IRRIGATION AND DEBRIDEMENT LEFT WRIST JOINT;  Surgeon: Alyse Agent, MD;  Location: [facility] OR;  Service: Orthopedics;  Laterality: Left;    LAPAROSCOPIC PARTIAL NEPHRECTOMY  01/25/05   Dr. Alline   REPAIR ANKLE LIGAMENT  04/2009   disatal fib/infx       Home Medications    Prior to Admission medications  Medication Sig Start Date End Date Taking? Authorizing Provider  neomycin -polymyxin b-dexamethasone  (MAXITROL) 3.5-10000-0.1 OINT Place 1 Application into both eyes 3 (three) times daily for 7 days. 06/16/24 06/23/24 Yes Renalda Locklin E, PA-C  albuterol  (VENTOLIN  HFA) 108 (90 Base) MCG/ACT inhaler Inhale 2 puffs into the lungs 4 (four) times daily as needed for wheezing or shortness of breath. 04/14/21   [provider]  amLODipine  (NORVASC ) 10 MG tablet Take 1 tablet (10 mg total) by mouth daily. 02/02/12  Inocencio Berwyn LABOR, MD  ammonium lactate  (LAC-HYDRIN ) 12 % lotion Apply 1 Application topically as needed for dry skin. 06/22/23   Geofm Glade PARAS, MD  aspirin  EC 81 MG tablet Take 81 mg by mouth 3 (three) times a week. 11/18/09   [provider]  donepezil (ARICEPT) 10 MG tablet Take 10 mg by mouth. 07/04/23   [provider]  escitalopram (LEXAPRO) 10 MG tablet Take 10 mg by mouth daily. 06/23/23   [provider]  insulin  glargine-yfgn (SEMGLEE )  100 UNIT/ML Pen Inject 28 Units into the skin daily. 08/13/21   [provider]  lisinopril  (ZESTRIL ) 10 MG tablet Take 1 tablet (10 mg total) by mouth daily. 06/10/21   Geofm Glade PARAS, MD  metFORMIN  (GLUCOPHAGE ) 1000 MG tablet Take 1,000 mg by mouth 2 (two) times daily with a meal.  12/09/10   Norleen Lynwood ORN, MD  silver sulfADIAZINE (SILVADENE) 1 % cream Apply topically. 05/26/23   [provider]  triamcinolone ointment (KENALOG) 0.1 % Apply 1 Application topically 2 (two) times daily as needed (itching).    [provider]  White Petrolatum (PETROLATUM WHITE) OINT Apply topically. 12/23/22   [provider]    Family History Family History  Problem Relation Age of Onset   Asthma Father     Social History Social History[1]   Allergies   Diclofenac , Zocor  [simvastatin ], and Chlorhexidine    Review of Systems Review of Systems  Eyes:  Positive for photophobia, redness and itching. Negative for pain, discharge and visual disturbance.     Physical Exam Triage Vital Signs ED Triage Vitals [06/16/24 0932]  Encounter Vitals Group     BP (!) 181/79     Girls Systolic BP Percentile      Girls Diastolic BP Percentile      Boys Systolic BP Percentile      Boys Diastolic BP Percentile      Pulse Rate 74     Resp 16     Temp 98 F (36.7 C)     Temp Source Oral     SpO2 95 %     Weight      Height      Head Circumference      Peak Flow      Pain Score      Pain Loc      Pain Education      Exclude from Growth Chart    No data found.  Updated Vital Signs BP (!) 181/79 (BP Location: Left Arm)   Pulse 74   Temp 98 F (36.7 C) (Oral)   Resp 16   SpO2 95%   Visual Acuity Right Eye Distance:  (could not see at all out of his eye) -cataract surgery scheduled for 07/05/2024. Left Eye Distance: 20/40 Bilateral Distance: 20/40  Right Eye Near:   Left Eye Near:    Bilateral Near:     Physical Exam Vitals reviewed.  Constitutional:       General: He is not in acute distress.    Appearance: Normal appearance. He is not ill-appearing.  HENT:     Head: Normocephalic and atraumatic.  Eyes:     Comments: Facial skin is unusually tight and smooth.  There is bilateral conjunctival injection and watering.  The upper and lower lids are mildly erythematous along the lash line.  PERRLA, EOMI, visual acuity grossly intact.  Lids do not fully close. Does not wear glasses or contacts.  Pulmonary:     Effort:  Pulmonary effort is normal.  Neurological:     General: No focal deficit present.     Mental Status: He is alert and oriented to person, place, and time.  Psychiatric:        Mood and Affect: Mood normal.        Behavior: Behavior normal.        Thought Content: Thought content normal.        Judgment: Judgment normal.      UC Treatments / Results  Labs (all labs ordered are listed, but only abnormal results are displayed) Labs Reviewed - No data to display  EKG   Radiology No results found.  Procedures Procedures (including critical care time)  Medications Ordered in UC Medications - No data to display  Initial Impression / Assessment and Plan / UC Course  I have reviewed the triage vital signs and the nursing notes.  Pertinent labs & imaging results that were available during my care of the patient were reviewed by me and considered in my medical decision making (see chart for details).     Patient is a pleasant 79 y.o. male presenting with blepharitis of bilateral upper and lower lids. The patient is afebrile and nontachycardic.  Antipyretic has not been administered today.  Left eye visual acuity is grossly intact, he does not wear glasses or contacts.  Scheduled for cataract surgery right eye 07/05/2024.  Eye irritation since getting hydrocortisone cream in his eyes.  Unsure of the duration that he has been using the hydrocortisone cream.  Given inflammation of the bilateral eyes, recommended using the neomycin   polymyxin B dexamethasone  ointment 3 times daily for 7 days.  Strict ophthalmology follow-up as below.  His eyes do not fully close, and so I also encouraged buying an eye mask to wear at night.  Final Clinical Impressions(s) / UC Diagnoses   Final diagnoses:  Blepharitis of upper and lower eyelids of both eyes, unspecified type     Discharge Instructions      -You have inflammation of the upper and lower eyelids, which we call blepharitis. -I have prescribed an antibiotic ointment called neomycin  polymyxin B dexamethasone .  Use this 3 times daily.  You can either pull down the lower eyelid, and squeeze a corder inch of the ointment under the lower eyelid; or, wash your hands, and put a pea-sized amount of ointment on your finger, and then rub this on the upper and lower eyelid.  Some ointment will get in your eye, and this is the goal.  Your vision will be blurry for several minutes after applying the ointment.  Apply the ointment 3 times daily, like breakfast lunch and bedtime. ---->If your symptoms are improving in 48 hours, then you can wait to see your eye doctor before your appointment on 07/05/2024; if your symptoms are not improving in 48 hours, then call your eye doctor on 06/18/2024 to schedule a follow-up sooner.  This includes if you have vision changes, vision loss, new floaters or flashes of light in your field of vision, sensation of foreign body in your eyes, swelling of the upper or lower lid.     ED Prescriptions     Medication Sig Dispense Auth. Provider   neomycin -polymyxin b-dexamethasone  (MAXITROL) 3.5-10000-0.1 OINT Place 1 Application into both eyes 3 (three) times daily for 7 days. 21 g Jeiden Daughtridge E, PA-C      PDMP not reviewed this encounter.     [1]  Social History Tobacco Use   Smoking  status: Former    Current packs/day: 0.00    Types: Cigarettes    Quit date: 05/31/1994    Years since quitting: 30.0   Smokeless tobacco: Never   Tobacco comments:     Married, lives with wife. 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

## 2024-06-16 NOTE — Discharge Instructions (Addendum)
-  You have inflammation of the upper and lower eyelids, which we call blepharitis. -I have prescribed an antibiotic ointment called neomycin  polymyxin B dexamethasone .  Use this 3 times daily.  You can either pull down the lower eyelid, and squeeze a corder inch of the ointment under the lower eyelid; or, wash your hands, and put a pea-sized amount of ointment on your finger, and then rub this on the upper and lower eyelid.  Some ointment will get in your eye, and this is the goal.  Your vision will be blurry for several minutes after applying the ointment.  Apply the ointment 3 times daily, like breakfast lunch and bedtime. ---->If your symptoms are improving in 48 hours, then you can wait to see your eye doctor before your appointment on 07/05/2024; if your symptoms are not improving in 48 hours, then call your eye doctor on 06/18/2024 to schedule a follow-up sooner.  This includes if you have vision changes, vision loss, new floaters or flashes of light in your field of vision, sensation of foreign body in your eyes, swelling of the upper or lower lid.

## 2024-06-27 ENCOUNTER — Encounter: Payer: Self-pay | Admitting: Internal Medicine

## 2024-06-27 ENCOUNTER — Ambulatory Visit: Admitting: Internal Medicine

## 2024-06-27 VITALS — BP 154/76 | HR 80 | Temp 98.5°F | Resp 16 | Ht 66.0 in | Wt 151.0 lb

## 2024-06-27 DIAGNOSIS — Z0000 Encounter for general adult medical examination without abnormal findings: Secondary | ICD-10-CM | POA: Diagnosis not present

## 2024-06-27 DIAGNOSIS — E1159 Type 2 diabetes mellitus with other circulatory complications: Secondary | ICD-10-CM | POA: Diagnosis not present

## 2024-06-27 DIAGNOSIS — M609 Myositis, unspecified: Secondary | ICD-10-CM | POA: Diagnosis not present

## 2024-06-27 DIAGNOSIS — T466X5A Adverse effect of antihyperlipidemic and antiarteriosclerotic drugs, initial encounter: Secondary | ICD-10-CM

## 2024-06-27 DIAGNOSIS — F419 Anxiety disorder, unspecified: Secondary | ICD-10-CM

## 2024-06-27 DIAGNOSIS — I152 Hypertension secondary to endocrine disorders: Secondary | ICD-10-CM | POA: Diagnosis not present

## 2024-06-27 DIAGNOSIS — E1122 Type 2 diabetes mellitus with diabetic chronic kidney disease: Secondary | ICD-10-CM | POA: Diagnosis not present

## 2024-06-27 DIAGNOSIS — N1831 Chronic kidney disease, stage 3a: Secondary | ICD-10-CM

## 2024-06-27 DIAGNOSIS — E785 Hyperlipidemia, unspecified: Secondary | ICD-10-CM

## 2024-06-27 DIAGNOSIS — Z0001 Encounter for general adult medical examination with abnormal findings: Secondary | ICD-10-CM

## 2024-06-27 DIAGNOSIS — L2084 Intrinsic (allergic) eczema: Secondary | ICD-10-CM

## 2024-06-27 DIAGNOSIS — N184 Chronic kidney disease, stage 4 (severe): Secondary | ICD-10-CM | POA: Diagnosis not present

## 2024-06-27 DIAGNOSIS — E1169 Type 2 diabetes mellitus with other specified complication: Secondary | ICD-10-CM | POA: Diagnosis not present

## 2024-06-27 DIAGNOSIS — D72110 Idiopathic hypereosinophilic syndrome (ihes): Secondary | ICD-10-CM | POA: Diagnosis not present

## 2024-06-27 LAB — LIPID PANEL
Cholesterol: 188 mg/dL (ref 28–200)
HDL: 67.4 mg/dL
LDL Cholesterol: 97 mg/dL (ref 10–99)
NonHDL: 120.19
Total CHOL/HDL Ratio: 3
Triglycerides: 116 mg/dL (ref 10.0–149.0)
VLDL: 23.2 mg/dL (ref 0.0–40.0)

## 2024-06-27 LAB — CBC WITH DIFFERENTIAL/PLATELET
Basophils Absolute: 0.1 10*3/uL (ref 0.0–0.1)
Basophils Relative: 0.7 % (ref 0.0–3.0)
Eosinophils Absolute: 0.9 10*3/uL — ABNORMAL HIGH (ref 0.0–0.7)
Eosinophils Relative: 13.6 % — ABNORMAL HIGH (ref 0.0–5.0)
HCT: 40.1 % (ref 39.0–52.0)
Hemoglobin: 13.1 g/dL (ref 13.0–17.0)
Lymphocytes Relative: 14.6 % (ref 12.0–46.0)
Lymphs Abs: 1 10*3/uL (ref 0.7–4.0)
MCHC: 32.7 g/dL (ref 30.0–36.0)
MCV: 94.4 fl (ref 78.0–100.0)
Monocytes Absolute: 0.7 10*3/uL (ref 0.1–1.0)
Monocytes Relative: 10.9 % (ref 3.0–12.0)
Neutro Abs: 4.1 10*3/uL (ref 1.4–7.7)
Neutrophils Relative %: 60.2 % (ref 43.0–77.0)
Platelets: 280 10*3/uL (ref 150.0–400.0)
RBC: 4.25 Mil/uL (ref 4.22–5.81)
RDW: 16.8 % — ABNORMAL HIGH (ref 11.5–15.5)
WBC: 6.8 10*3/uL (ref 4.0–10.5)

## 2024-06-27 LAB — BASIC METABOLIC PANEL WITH GFR
BUN: 24 mg/dL — ABNORMAL HIGH (ref 6–23)
CO2: 29 meq/L (ref 19–32)
Calcium: 8.9 mg/dL (ref 8.4–10.5)
Chloride: 102 meq/L (ref 96–112)
Creatinine, Ser: 1.86 mg/dL — ABNORMAL HIGH (ref 0.40–1.50)
GFR: 34.35 mL/min — ABNORMAL LOW
Glucose, Bld: 298 mg/dL — ABNORMAL HIGH (ref 70–99)
Potassium: 4 meq/L (ref 3.5–5.1)
Sodium: 136 meq/L (ref 135–145)

## 2024-06-27 LAB — URINALYSIS, ROUTINE W REFLEX MICROSCOPIC
Bilirubin Urine: NEGATIVE
Ketones, ur: NEGATIVE
Leukocytes,Ua: NEGATIVE
Nitrite: NEGATIVE
Specific Gravity, Urine: 1.02 (ref 1.000–1.030)
Total Protein, Urine: 100 — AB
Urine Glucose: >=1000 — AB
Urobilinogen, UA: 0.2 (ref 0.0–1.0)
pH: 6 (ref 5.0–8.0)

## 2024-06-27 LAB — HEMOGLOBIN A1C: Hgb A1c MFr Bld: 8.1 % — ABNORMAL HIGH (ref 4.6–6.5)

## 2024-06-27 LAB — HEPATIC FUNCTION PANEL
ALT: 11 U/L (ref 3–53)
AST: 18 U/L (ref 5–37)
Albumin: 4 g/dL (ref 3.5–5.2)
Alkaline Phosphatase: 100 U/L (ref 39–117)
Bilirubin, Direct: 0 mg/dL — ABNORMAL LOW (ref 0.1–0.3)
Total Bilirubin: 0.3 mg/dL (ref 0.2–1.2)
Total Protein: 7.8 g/dL (ref 6.0–8.3)

## 2024-06-27 LAB — MICROALBUMIN / CREATININE URINE RATIO
Creatinine,U: 76.7 mg/dL
Microalb Creat Ratio: 798.5 mg/g — ABNORMAL HIGH (ref 0.0–30.0)
Microalb, Ur: 61.2 mg/dL — ABNORMAL HIGH (ref 0.7–1.9)

## 2024-06-27 LAB — C-REACTIVE PROTEIN: CRP: <0.5 mg/dL — ABNORMAL LOW (ref 1.0–20.0)

## 2024-06-27 LAB — CK: Total CK: 267 U/L — ABNORMAL HIGH (ref 17–232)

## 2024-06-27 NOTE — Progress Notes (Unsigned)
 "           Subjective:  Patient ID: Max Tamburrino., male    DOB: 05-01-1946  Age: 79 y.o. MRN: [id number]  CC: Dry Skin/ Skin Irritation (Hands face and arms. )   HPI Max Adarrius Graeff. presents for f/up -- new to me  Discussed the use of AI scribe software for clinical note transcription with the patient, who gave verbal consent to proceed.  History of Present Illness Max Goebel Hellums. is a 79 year old male who presents with dry, peeling skin.  He has been experiencing dry, peeling skin for about a month, with previous sporadic episodes in the past. This time, the condition has been more persistent. He has tried using a lotion recommended by a club store, which he describes as 'jerking a lotion or something,' but it did not provide relief. He also uses a petroleum-based product, which he feels has not been effective. The dryness is particularly noted on his face, where he uses a hot towel to remove the dry skin, but it returns quickly.  He denies any fever but occasionally experiences chills. He takes three showers a day. No weakness, dizziness, lightheadedness, headache, blurred vision, stomach pain, coughing, or wheezing. He has noticed some recent weight loss, which was intentional, but no changes in appetite.  His past medical history includes diabetes, but he cannot recall his last blood sugar reading, which was checked about a week ago. He does not have a list of his current medications with him. He mentions a planned surgical procedure next week and a visit to the [facility] for follow-up.  He stopped smoking 30 years ago and drinks alcohol occasionally, with no consumption in the past month to two months.     Outpatient Medications Prior to Visit  Medication Sig Dispense Refill   albuterol  (VENTOLIN  HFA) 108 (90 Base) MCG/ACT inhaler Inhale 2 puffs into the lungs 4 (four) times daily as needed for wheezing or shortness of breath.     amLODipine  (NORVASC ) 10 MG tablet Take 1  tablet (10 mg total) by mouth daily. 90 tablet 1   ammonium lactate  (LAC-HYDRIN ) 12 % lotion Apply 1 Application topically as needed for dry skin. 400 g 8   aspirin  EC 81 MG tablet Take 81 mg by mouth 3 (three) times a week.     donepezil (ARICEPT) 10 MG tablet Take 10 mg by mouth.     escitalopram (LEXAPRO) 10 MG tablet Take 10 mg by mouth daily.     insulin  glargine-yfgn (SEMGLEE ) 100 UNIT/ML Pen Inject 28 Units into the skin daily.     lisinopril  (ZESTRIL ) 10 MG tablet Take 1 tablet (10 mg total) by mouth daily. 90 tablet 3   metFORMIN  (GLUCOPHAGE ) 1000 MG tablet Take 1,000 mg by mouth 2 (two) times daily with a meal.      silver sulfADIAZINE (SILVADENE) 1 % cream Apply topically.     triamcinolone ointment (KENALOG) 0.1 % Apply 1 Application topically 2 (two) times daily as needed (itching).     White Petrolatum (PETROLATUM WHITE) OINT Apply topically.     No facility-administered medications prior to visit.    ROS Review of Systems  Constitutional:  Positive for chills. Negative for activity change, appetite change, fatigue, fever and unexpected weight change.  HENT: Negative.  Negative for sore throat and trouble swallowing.   Eyes: Negative.  Negative for visual disturbance.  Respiratory: Negative.  Negative for cough, chest tightness, shortness of breath and wheezing.  Cardiovascular:  Negative for chest pain, palpitations and leg swelling.  Gastrointestinal: Negative.  Negative for abdominal pain, constipation, diarrhea, nausea and vomiting.  Endocrine: Negative.  Negative for polydipsia, polyphagia and polyuria.  Genitourinary: Negative.  Negative for difficulty urinating and dysuria.  Musculoskeletal: Negative.  Negative for arthralgias, joint swelling and myalgias.  Skin:  Positive for color change and rash. Negative for pallor and wound.  Neurological: Negative.  Negative for dizziness, weakness and light-headedness.  Hematological:  Negative for adenopathy. Does not  bruise/bleed easily.  Psychiatric/Behavioral: Negative.      Objective:  BP (!) 154/76 (BP Location: Left Arm, Patient Position: Sitting, Cuff Size: Normal)   Pulse 80   Temp 98.5 F (36.9 C) (Temporal)   Ht 5' 6 (1.676 m)   Wt 151 lb (68.5 kg)   SpO2 99%   BMI 24.37 kg/m   BP Readings from Last 3 Encounters:  06/27/24 (!) 154/76  06/16/24 (!) 181/79  02/24/24 (!) 150/80    Wt Readings from Last 3 Encounters:  06/27/24 151 lb (68.5 kg)  02/24/24 155 lb (70.3 kg)  08/31/23 150 lb (68 kg)    Physical Exam Vitals reviewed.  Constitutional:      General: He is not in acute distress.    Appearance: He is ill-appearing. He is not toxic-appearing or diaphoretic.  HENT:     Nose: Nose normal.     Mouth/Throat:     Mouth: Mucous membranes are moist.  Eyes:     General: No scleral icterus.    Conjunctiva/sclera: Conjunctivae normal.  Cardiovascular:     Rate and Rhythm: Normal rate and regular rhythm.     Heart sounds: Normal heart sounds, S1 normal and S2 normal. No murmur heard.    Comments: EKG-- NSR, 78 bpm LVH - new No Q waves Pulmonary:     Effort: Pulmonary effort is normal.     Breath sounds: No stridor. No wheezing, rhonchi or rales.  Abdominal:     General: Abdomen is flat.     Palpations: There is no mass.     Tenderness: There is no abdominal tenderness. There is no guarding.     Hernia: No hernia is present.  Musculoskeletal:        General: Normal range of motion.     Cervical back: Neck supple.     Right lower leg: No edema.     Left lower leg: No edema.  Lymphadenopathy:     Cervical: No cervical adenopathy.  Skin:    Coloration: Skin is not jaundiced.     Findings: Erythema and rash present. No lesion.  Neurological:     General: No focal deficit present.     Mental Status: He is alert.  Psychiatric:        Mood and Affect: Mood normal.        Behavior: Behavior normal.     Lab Results  Component Value Date   WBC 4.4 06/16/2022   HGB  13.9 06/16/2022   HCT 41.4 06/16/2022   PLT 299 06/16/2022   GLUCOSE 163 (H) 06/16/2022   CHOL 221 (H) 08/26/2021   TRIG 108.0 08/26/2021   HDL 64.10 08/26/2021   LDLDIRECT 143.2 02/02/2012   LDLCALC 135 (H) 08/26/2021   ALT 9 02/26/2022   AST 13 (L) 02/26/2022   NA 139 06/16/2022   K 4.1 06/16/2022   CL 102 06/16/2022   CREATININE 1.24 06/16/2022   BUN 17 06/16/2022   CO2 26 06/16/2022   TSH  0.84 07/22/2016   HGBA1C 7.1 04/15/2023    No results found.  Assessment & Plan:  Type 2 diabetes mellitus with stage 3a chronic kidney disease, without long-term current use of insulin  (HCC) -     Basic metabolic panel with GFR; Future -     Urinalysis, Routine w reflex microscopic; Future -     Hemoglobin A1c; Future -     Microalbumin / creatinine urine ratio; Future  Hyperlipidemia associated with type 2 diabetes mellitus (HCC) -     Lipid panel; Future -     TSH; Future -     Hepatic function panel; Future -     CK; Future  CKD stage 3a, GFR 45-59 ml/min (HCC) -     Basic metabolic panel with GFR; Future -     Urinalysis, Routine w reflex microscopic; Future -     Microalbumin / creatinine urine ratio; Future  Statin-induced myositis -     CK; Future  Hypertension associated with type 2 diabetes mellitus (HCC) -     EKG 12-Lead -     Basic metabolic panel with GFR; Future -     TSH; Future  Anxiety  Idiopathic hypereosinophilic syndrome -     CBC with Differential/Platelet; Future -     C-reactive protein; Future -     RPR W/RFLX TO RPR TITER, TREPONEMAL AB, SCREEN AND DIAGNOSIS; Future     Follow-up: Return in about 3 months (around 09/25/2024).  Debby Molt, MD "

## 2024-06-27 NOTE — Patient Instructions (Signed)
 Hypertension, Adult High blood pressure (hypertension) is when the force of blood pumping through the arteries is too strong. The arteries are the blood vessels that carry blood from the heart throughout the body. Hypertension forces the heart to work harder to pump blood and may cause arteries to become narrow or stiff. Untreated or uncontrolled hypertension can lead to a heart attack, heart failure, a stroke, kidney disease, and other problems. A blood pressure reading consists of a higher number over a lower number. Ideally, your blood pressure should be below 120/80. The first ("top") number is called the systolic pressure. It is a measure of the pressure in your arteries as your heart beats. The second ("bottom") number is called the diastolic pressure. It is a measure of the pressure in your arteries as the heart relaxes. What are the causes? The exact cause of this condition is not known. There are some conditions that result in high blood pressure. What increases the risk? Certain factors may make you more likely to develop high blood pressure. Some of these risk factors are under your control, including: Smoking. Not getting enough exercise or physical activity. Being overweight. Having too much fat, sugar, calories, or salt (sodium) in your diet. Drinking too much alcohol. Other risk factors include: Having a personal history of heart disease, diabetes, high cholesterol, or kidney disease. Stress. Having a family history of high blood pressure and high cholesterol. Having obstructive sleep apnea. Age. The risk increases with age. What are the signs or symptoms? High blood pressure may not cause symptoms. Very high blood pressure (hypertensive crisis) may cause: Headache. Fast or irregular heartbeats (palpitations). Shortness of breath. Nosebleed. Nausea and vomiting. Vision changes. Severe chest pain, dizziness, and seizures. How is this diagnosed? This condition is diagnosed by  measuring your blood pressure while you are seated, with your arm resting on a flat surface, your legs uncrossed, and your feet flat on the floor. The cuff of the blood pressure monitor will be placed directly against the skin of your upper arm at the level of your heart. Blood pressure should be measured at least twice using the same arm. Certain conditions can cause a difference in blood pressure between your right and left arms. If you have a high blood pressure reading during one visit or you have normal blood pressure with other risk factors, you may be asked to: Return on a different day to have your blood pressure checked again. Monitor your blood pressure at home for 1 week or longer. If you are diagnosed with hypertension, you may have other blood or imaging tests to help your health care provider understand your overall risk for other conditions. How is this treated? This condition is treated by making healthy lifestyle changes, such as eating healthy foods, exercising more, and reducing your alcohol intake. You may be referred for counseling on a healthy diet and physical activity. Your health care provider may prescribe medicine if lifestyle changes are not enough to get your blood pressure under control and if: Your systolic blood pressure is above 130. Your diastolic blood pressure is above 80. Your personal target blood pressure may vary depending on your medical conditions, your age, and other factors. Follow these instructions at home: Eating and drinking  Eat a diet that is high in fiber and potassium, and low in sodium, added sugar, and fat. An example of this eating plan is called the DASH diet. DASH stands for Dietary Approaches to Stop Hypertension. To eat this way: Eat  plenty of fresh fruits and vegetables. Try to fill one half of your plate at each meal with fruits and vegetables. Eat whole grains, such as whole-wheat pasta, brown rice, or whole-grain bread. Fill about one  fourth of your plate with whole grains. Eat or drink low-fat dairy products, such as skim milk or low-fat yogurt. Avoid fatty cuts of meat, processed or cured meats, and poultry with skin. Fill about one fourth of your plate with lean proteins, such as fish, chicken without skin, beans, eggs, or tofu. Avoid pre-made and processed foods. These tend to be higher in sodium, added sugar, and fat. Reduce your daily sodium intake. Many people with hypertension should eat less than 1,500 mg of sodium a day. Do not drink alcohol if: Your health care provider tells you not to drink. You are pregnant, may be pregnant, or are planning to become pregnant. If you drink alcohol: Limit how much you have to: 0-1 drink a day for women. 0-2 drinks a day for men. Know how much alcohol is in your drink. In the [place] one drink equals one 12 oz bottle of beer (355 mL), one 5 oz glass of wine (148 mL), or one 1 oz glass of hard liquor (44 mL). Lifestyle  Work with your health care provider to maintain a healthy body weight or to lose weight. Ask what an ideal weight is for you. Get at least 30 minutes of exercise that causes your heart to beat faster (aerobic exercise) most days of the week. Activities may include walking, swimming, or biking. Include exercise to strengthen your muscles (resistance exercise), such as Pilates or lifting weights, as part of your weekly exercise routine. Try to do these types of exercises for 30 minutes at least 3 days a week. Do not use any products that contain nicotine or tobacco. These products include cigarettes, chewing tobacco, and vaping devices, such as e-cigarettes. If you need help quitting, ask your health care provider. Monitor your blood pressure at home as told by your health care provider. Keep all follow-up visits. This is important. Medicines Take over-the-counter and prescription medicines only as told by your health care provider. Follow directions carefully. Blood  pressure medicines must be taken as prescribed. Do not skip doses of blood pressure medicine. Doing this puts you at risk for problems and can make the medicine less effective. Ask your health care provider about side effects or reactions to medicines that you should watch for. Contact a health care provider if you: Think you are having a reaction to a medicine you are taking. Have headaches that keep coming back (recurring). Feel dizzy. Have swelling in your ankles. Have trouble with your vision. Get help right away if you: Develop a severe headache or confusion. Have unusual weakness or numbness. Feel faint. Have severe pain in your chest or abdomen. Vomit repeatedly. Have trouble breathing. These symptoms may be an emergency. Get help right away. Call 911. Do not wait to see if the symptoms will go away. Do not drive yourself to the hospital. Summary Hypertension is when the force of blood pumping through your arteries is too strong. If this condition is not controlled, it may put you at risk for serious complications. Your personal target blood pressure may vary depending on your medical conditions, your age, and other factors. For most people, a normal blood pressure is less than 120/80. Hypertension is treated with lifestyle changes, medicines, or a combination of both. Lifestyle changes include losing weight, eating a healthy,  low-sodium diet, exercising more, and limiting alcohol. This information is not intended to replace advice given to you by your health care provider. Make sure you discuss any questions you have with your health care provider. Document Revised: 03/24/2021 Document Reviewed: 03/24/2021 Elsevier Patient Education  2024 [employer].

## 2024-06-28 ENCOUNTER — Ambulatory Visit: Payer: Self-pay | Admitting: Internal Medicine

## 2024-06-28 DIAGNOSIS — E1122 Type 2 diabetes mellitus with diabetic chronic kidney disease: Secondary | ICD-10-CM | POA: Insufficient documentation

## 2024-06-28 DIAGNOSIS — L2084 Intrinsic (allergic) eczema: Secondary | ICD-10-CM | POA: Insufficient documentation

## 2024-06-28 DIAGNOSIS — Z0001 Encounter for general adult medical examination with abnormal findings: Secondary | ICD-10-CM | POA: Insufficient documentation

## 2024-06-28 DIAGNOSIS — N184 Chronic kidney disease, stage 4 (severe): Secondary | ICD-10-CM | POA: Insufficient documentation

## 2024-06-28 LAB — SYPHILIS: RPR W/REFLEX TO RPR TITER AND TREPONEMAL ANTIBODIES, TRADITIONAL SCREENING AND DIAGNOSIS ALGORITHM: RPR Ser Ql: NONREACTIVE

## 2024-06-28 LAB — TSH: TSH: 1.34 u[IU]/mL (ref 0.35–5.50)

## 2024-06-28 MED ORDER — METHYLPREDNISOLONE 4 MG PO TBPK: ORAL_TABLET | ORAL | 0 refills | Status: AC

## 2024-06-28 MED ORDER — AMLODIPINE BESYLATE 5 MG PO TABS: 5.0000 mg | ORAL_TABLET | Freq: Every day | ORAL | 0 refills | Status: AC

## 2024-06-28 MED ORDER — HYDROXYZINE HCL 10 MG PO TABS: 10.0000 mg | ORAL_TABLET | Freq: Three times a day (TID) | ORAL | 0 refills | Status: AC | PRN

## 2024-06-29 ENCOUNTER — Telehealth: Payer: Self-pay

## 2024-06-29 NOTE — Progress Notes (Unsigned)
 Care Guide Pharmacy Note  06/29/2024 Name: Jamille Yoshino. MRN: [id number] DOB: 12-01-1945  Referred By: Geofm Glade PARAS, MD Reason for referral: Call Attempt #1 and Complex Care Management (Outreach to sch ref w/ pharm)   Marvyn Ancel Easler. is a 79 y.o. year old male who is a primary care patient of Burns, Glade PARAS, MD.  [name]. was referred to the pharmacist for assistance related to: HTN  An unsuccessful telephone outreach was attempted today to contact the patient who was referred to the pharmacy team for assistance with medication management. Additional attempts will be made to contact the patient.  Doyce Razor [facility], [facility] Guide Direct Dial: [phone]  Fax: [phone]

## 2024-06-29 NOTE — Progress Notes (Signed)
 Max [name].                                          MRN: [id number]   06/29/2024   The [facility] Quality Team Specialist reviewed this patient medical record for the purposes of chart review for care gap closure. The following were reviewed: erroneous encounter.    [facility] Quality Team

## 2024-07-02 NOTE — Progress Notes (Unsigned)
 Care Guide Pharmacy Note  07/02/2024 Name: [name]. MRN: [id number] DOB: [birth year]  Referred By: Geofm Glade PARAS, MD Reason for referral: Call Attempt #1, Complex Care Management (Outreach to sch ref w/ pharm), and Call Attempt #2   Max [name]. is a 79 y.o. year old male who is a primary care patient of Burns, Glade PARAS, MD.  [name]. was referred to the pharmacist for assistance related to: HTN  A second unsuccessful telephone outreach was attempted today to contact the patient who was referred to the pharmacy team for assistance with medication management. Additional attempts will be made to contact the patient. Patient requested to be called back later.   Doyce Razor [facility], [facility] Guide Direct Dial: [phone]  Fax: [phone]

## 2024-07-03 NOTE — Progress Notes (Signed)
 Care Guide Pharmacy Note  07/03/2024 Name: Max [name]. MRN: [id number] DOB: September 21, 1945  Referred By: Geofm Glade PARAS, MD Reason for referral: Call Attempt #1, Complex Care Management (Outreach to sch ref w/ pharm), Call Attempt #2, and Call Attempt #3   [name]. is a 79 y.o. year old male who is a primary care patient of Burns, Glade PARAS, MD.  [name]. was referred to the pharmacist for assistance related to: HTN and DMII  A third unsuccessful telephone outreach was attempted today to contact the patient who was referred to the pharmacy team for assistance with medication management. The Population Health team is pleased to engage with this patient at any time in the future upon receipt of referral and should he/she be interested in assistance from the [employer] [facility] team.  Doyce Razor [facility]  [facility], [facility] Guide Direct Dial: [phone]  Fax: [phone]

## 2024-09-03 ENCOUNTER — Ambulatory Visit

## 2024-09-20 ENCOUNTER — Ambulatory Visit
